# Patient Record
Sex: Male | Born: 1960 | Race: White | Hispanic: No | Smoking: Former smoker
Health system: Southern US, Community
[De-identification: ages and names within clinical notes are randomized; demographics above are authoritative.]

## PROBLEM LIST (undated history)

## (undated) DIAGNOSIS — I701 Atherosclerosis of renal artery: Secondary | ICD-10-CM

## (undated) DIAGNOSIS — J189 Pneumonia, unspecified organism: Secondary | ICD-10-CM

## (undated) DIAGNOSIS — K5792 Diverticulitis of intestine, part unspecified, without perforation or abscess without bleeding: Secondary | ICD-10-CM

## (undated) DIAGNOSIS — I10 Essential (primary) hypertension: Secondary | ICD-10-CM

## (undated) DIAGNOSIS — J449 Chronic obstructive pulmonary disease, unspecified: Secondary | ICD-10-CM

## (undated) HISTORY — DX: Essential (primary) hypertension: I10

## (undated) HISTORY — DX: Diverticulitis of intestine, part unspecified, without perforation or abscess without bleeding: K57.92

## (undated) HISTORY — DX: Atherosclerosis of renal artery: I70.1

## (undated) HISTORY — PX: HERNIA REPAIR: SHX51

---
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
## 1998-01-29 ENCOUNTER — Emergency Department (HOSPITAL_COMMUNITY): Admission: EM | Admit: 1998-01-29 | Discharge: 1998-01-29 | Payer: Self-pay | Admitting: Emergency Medicine

## 1998-02-03 ENCOUNTER — Encounter: Admission: RE | Admit: 1998-02-03 | Discharge: 1998-05-04 | Payer: Self-pay | Admitting: *Deleted

## 1998-02-12 ENCOUNTER — Encounter: Admission: RE | Admit: 1998-02-12 | Discharge: 1998-05-13 | Payer: Self-pay | Admitting: *Deleted

## 2001-05-04 ENCOUNTER — Emergency Department (HOSPITAL_COMMUNITY): Admission: EM | Admit: 2001-05-04 | Discharge: 2001-05-04 | Payer: Self-pay | Admitting: Emergency Medicine

## 2001-05-04 ENCOUNTER — Encounter: Payer: Self-pay | Admitting: Emergency Medicine

## 2010-07-22 ENCOUNTER — Institutional Professional Consult (permissible substitution): Payer: Self-pay | Admitting: Internal Medicine

## 2014-11-09 ENCOUNTER — Ambulatory Visit (INDEPENDENT_AMBULATORY_CARE_PROVIDER_SITE_OTHER): Payer: Self-pay | Admitting: Emergency Medicine

## 2014-11-09 VITALS — BP 148/80 | HR 102 | Temp 98.4°F | Resp 16 | Ht 65.0 in | Wt 166.1 lb

## 2014-11-09 DIAGNOSIS — H6091 Unspecified otitis externa, right ear: Secondary | ICD-10-CM

## 2014-11-09 DIAGNOSIS — IMO0001 Reserved for inherently not codable concepts without codable children: Secondary | ICD-10-CM

## 2014-11-09 DIAGNOSIS — H60331 Swimmer's ear, right ear: Secondary | ICD-10-CM

## 2014-11-09 DIAGNOSIS — H9201 Otalgia, right ear: Secondary | ICD-10-CM

## 2014-11-09 DIAGNOSIS — H748X1 Other specified disorders of right middle ear and mastoid: Secondary | ICD-10-CM

## 2014-11-09 DIAGNOSIS — R030 Elevated blood-pressure reading, without diagnosis of hypertension: Secondary | ICD-10-CM

## 2014-11-09 MED ORDER — AMOXICILLIN 500 MG PO CAPS: 500.0000 mg | ORAL_CAPSULE | Freq: Three times a day (TID) | ORAL | Status: DC

## 2014-11-09 MED ORDER — NEOMYCIN-POLYMYXIN-HC 3.5-10000-1 OT SUSP: 4.0000 [drp] | Freq: Four times a day (QID) | OTIC | Status: DC

## 2014-11-09 NOTE — Patient Instructions (Signed)

## 2014-11-09 NOTE — Progress Notes (Signed)
Subjective:  Patient ID: Matthew [name], male    DOB: March 07, 1961  Age: 54 y.o. MRN: [id number]  CC: Otalgia   HPI Matthew [name] presents   With pain in his right ear. He apparently had some itching in his ear and second Q-tip into his ear. Since that time his had some drainage and some pain. He has no fever chills nausea vomiting no cough or coryza. He has decreased hearing in that ear. Constant pain. He's had no improvement with over-the-counter medication  History Matthew [name] has no past medical history on file.   He has past surgical history that includes Hernia repair.   His  family history includes Lymphoma in his father.  He   reports that he has been smoking Cigarettes.  He has been smoking about 1.00 pack per day. He has never used smokeless tobacco. He reports that he drinks about 1.2 - 2.4 oz of alcohol per week. He reports that he does not use illicit drugs.  No outpatient prescriptions prior to visit.   No facility-administered medications prior to visit.    Social History   Social History  . Marital Status: Widowed    Spouse Name: N/A  . Number of Children: N/A  . Years of Education: N/A   Social History Main Topics  . Smoking status: Current Every Day Smoker -- 1.00 packs/day    Types: Cigarettes  . Smokeless tobacco: Never Used  . Alcohol Use: 1.2 - 2.4 oz/week    2-4 Standard drinks or equivalent per week  . Drug Use: No  . Sexual Activity: Not Asked   Other Topics Concern  . None   Social History Narrative  . None     Review of Systems  Constitutional: Negative for fever, chills and appetite change.  HENT: Positive for ear pain. Negative for congestion, postnasal drip, sinus pressure and sore throat.   Eyes: Negative for pain and redness.  Respiratory: Negative for cough, shortness of breath and wheezing.   Cardiovascular: Negative for leg swelling.  Gastrointestinal: Negative for nausea, vomiting, abdominal pain, diarrhea, constipation and  blood in stool.  Endocrine: Negative for polyuria.  Genitourinary: Negative for dysuria, urgency, frequency and flank pain.  Musculoskeletal: Negative for gait problem.  Skin: Negative for rash.  Neurological: Negative for weakness and headaches.  Psychiatric/Behavioral: Negative for confusion and decreased concentration. The patient is not nervous/anxious.     Objective:  BP 148/80 mmHg  Pulse 102  Temp(Src) 98.4 F (36.9 C) (Oral)  Resp 16  Ht '5\' 5"'$  (1.651 m)  Wt 166 lb 2 oz (75.354 kg)  BMI 27.64 kg/m2  SpO2 97%  Physical Exam  Constitutional: He is oriented to person, place, and time. He appears well-developed and well-nourished. No distress.  HENT:  Head: Normocephalic and atraumatic.  Right Ear: There is tenderness. Tympanic membrane is bulging. There is hemotympanum. Decreased hearing is noted.  Left Ear: External ear normal.  Nose: Nose normal.  Eyes: Conjunctivae and EOM are normal. Pupils are equal, round, and reactive to light. No scleral icterus.  Neck: Normal range of motion. Neck supple. No tracheal deviation present.  Cardiovascular: Normal rate, regular rhythm and normal heart sounds.   Pulmonary/Chest: Effort normal. No respiratory distress. He has no wheezes. He has no rales.  Abdominal: He exhibits no mass. There is no tenderness. There is no rebound and no guarding.  Musculoskeletal: He exhibits no edema.  Lymphadenopathy:    He has no cervical adenopathy.  Neurological: He is alert  and oriented to person, place, and time. Coordination normal.  Skin: Skin is warm and dry. No rash noted.  Psychiatric: He has a normal mood and affect. His behavior is normal.      Assessment & Plan:   Matthew [name] was seen today for otalgia.  Diagnoses and all orders for this visit:  Ear pain, right  Swimmer's ear, right  Hemotympanum, right  Elevated BP  Other orders -     neomycin-polymyxin-hydrocortisone (CORTISPORIN) 3.5-10000-1 otic suspension; Place 4 drops  into the right ear 4 (four) times daily. -     amoxicillin (AMOXIL) 500 MG capsule; Take 1 capsule (500 mg total) by mouth 3 (three) times daily.   I am having Matthew [name] start on neomycin-polymyxin-hydrocortisone and amoxicillin.  Meds ordered this encounter  Medications  . neomycin-polymyxin-hydrocortisone (CORTISPORIN) 3.5-10000-1 otic suspension    Sig: Place 4 drops into the right ear 4 (four) times daily.    Dispense:  10 mL    Refill:  0  . amoxicillin (AMOXIL) 500 MG capsule    Sig: Take 1 capsule (500 mg total) by mouth 3 (three) times daily.    Dispense:  30 capsule    Refill:  0    Appropriate red flag conditions were discussed with the patient as well as actions that should be taken.  Patient expressed his understanding.  Follow-up: Return if symptoms worsen or fail to improve.  Roselee Culver, MD

## 2014-11-11 ENCOUNTER — Telehealth: Payer: Self-pay

## 2014-11-11 NOTE — Telephone Encounter (Signed)
Spoke with pt, advised message from Nicole. Pt understood. 

## 2014-11-11 NOTE — Telephone Encounter (Signed)
He can try taking daily zyrtec which may help. He can take daily for next 2 weeks or so.

## 2014-11-11 NOTE — Telephone Encounter (Signed)
Pt states his ear pain has stopped but still have the fluid behind the ear.  He knows that it may take a while for it to go away but would like to know if there is something over the counter he could take to help  Best number is [phone]

## 2015-08-24 ENCOUNTER — Ambulatory Visit (HOSPITAL_COMMUNITY)
Admission: EM | Admit: 2015-08-24 | Discharge: 2015-08-24 | Disposition: A | Discharge status: Home / Self Care | Payer: 59 | Attending: Family Medicine | Admitting: Family Medicine

## 2015-08-24 ENCOUNTER — Encounter (HOSPITAL_COMMUNITY): Payer: Self-pay | Admitting: Emergency Medicine

## 2015-08-24 DIAGNOSIS — H6091 Unspecified otitis externa, right ear: Secondary | ICD-10-CM | POA: Diagnosis not present

## 2015-08-24 MED ORDER — NEOMYCIN-POLYMYXIN-HC 3.5-10000-1 OT SUSP: 4.0000 [drp] | OTIC | Status: DC

## 2015-08-24 MED ORDER — AMOXICILLIN 500 MG PO CAPS: 500.0000 mg | ORAL_CAPSULE | Freq: Three times a day (TID) | ORAL | Status: DC

## 2015-08-24 NOTE — ED Notes (Signed)
The patient presented to the [facility] with a complaint of right sided ear pain that started 4 days ago but has gotten progressively more painful.

## 2015-08-24 NOTE — ED Provider Notes (Signed)
CSN: [id number]     Arrival date & time 08/24/15  1717 History   First MD Initiated Contact with Patient 08/24/15 1808     Chief Complaint  Patient presents with  . Otalgia   (Consider location/radiation/quality/duration/timing/severity/associated sxs/prior Treatment) HPI History obtained from patient:  Pt presents with the cc of:  Right earache Duration of symptoms: Several days Treatment prior to arrival: None Context: Onset Friday or Saturday of sensation of fluid in the ear. Previous symptoms of this nature approximately a year ago. Other symptoms include: Muffled and echo in the right ear Pain score: 3 FAMILY HISTORY: Father-cancer    History reviewed. No pertinent past medical history. Past Surgical History  Procedure Laterality Date  . Hernia repair     Family History  Problem Relation Age of Onset  . Lymphoma Father    Social History  Substance Use Topics  . Smoking status: Current Every Day Smoker -- 1.00 packs/day    Types: Cigarettes  . Smokeless tobacco: Never Used  . Alcohol Use: 1.2 - 2.4 oz/week    2-4 Standard drinks or equivalent per week    Review of Systems  Denies: HEADACHE, NAUSEA, ABDOMINAL PAIN, CHEST PAIN, CONGESTION, DYSURIA, SHORTNESS OF BREATH  Allergies  Review of patient's allergies indicates no known allergies.  Home Medications   Prior to Admission medications   Medication Sig Start Date End Date Taking? Authorizing Provider  amoxicillin (AMOXIL) 500 MG capsule Take 1 capsule (500 mg total) by mouth 3 (three) times daily. 11/09/14   Roselee Culver, MD  amoxicillin (AMOXIL) 500 MG capsule Take 1 capsule (500 mg total) by mouth 3 (three) times daily. 08/24/15   Konrad Felix, PA  neomycin-polymyxin-hydrocortisone (CORTISPORIN) 3.5-10000-1 otic suspension Place 4 drops into the right ear 4 (four) times daily. 11/09/14   Roselee Culver, MD  neomycin-polymyxin-hydrocortisone (CORTISPORIN) 3.5-10000-1 otic suspension Place 4 drops  into the right ear every 4 (four) hours. 08/24/15   Konrad Felix, PA   Meds Ordered and Administered this Visit  Medications - No data to display  BP 159/81 mmHg  Pulse 97  Temp(Src) 98.1 F (36.7 C) (Oral)  Resp 16  SpO2 97% No data found.   Physical Exam NURSES NOTES AND VITAL SIGNS REVIEWED. CONSTITUTIONAL: Well developed, well nourished, no acute distress HEENT: normocephalic, atraumaticRight EAC is swollen and red with exudate TM is clear left TM is clear. EYES: Conjunctiva normal NECK:normal ROM, supple, no adenopathy PULMONARY:No respiratory distress, normal effort ABDOMINAL: Soft, ND, NT BS+, No CVAT MUSCULOSKELETAL: Normal ROM of all extremities,  SKIN: warm and dry without rash PSYCHIATRIC: Mood and affect, behavior are normal   ED Course  Procedures (including critical care time)  Labs Review Labs Reviewed - No data to display  Imaging Review No results found.   Visual Acuity Review  Right Eye Distance:   Left Eye Distance:   Bilateral Distance:    Right Eye Near:   Left Eye Near:    Bilateral Near:       RX amoxil, cortisporin  MDM   1. Otitis externa, right     Patient is reassured that there are no issues that require transfer to higher level of care at this time or additional tests. Patient is advised to continue home symptomatic treatment. Patient is advised that if there are new or worsening symptoms to attend the emergency department, contact primary care provider, or return to UC. Instructions of care provided discharged home in stable condition.  THIS NOTE WAS GENERATED USING A VOICE RECOGNITION SOFTWARE PROGRAM. ALL REASONABLE EFFORTS  WERE MADE TO PROOFREAD THIS DOCUMENT FOR ACCURACY.  I have verbally reviewed the discharge instructions with the patient. A printed AVS was given to the patient.  All questions were answered prior to discharge.      Konrad Felix, [place] 08/24/15 1840

## 2015-08-24 NOTE — Discharge Instructions (Signed)
Otitis Externa Otitis externa is a germ infection in the outer ear. The outer ear is the area from the eardrum to the outside of the ear. Otitis externa is sometimes called "swimmer's ear." HOME CARE  Put drops in the ear as told by your doctor.  Only take medicine as told by your doctor.  If you have diabetes, your doctor may give you more directions. Follow your doctor's directions.  Keep all doctor visits as told. To avoid another infection:  Keep your ear dry. Use the corner of a towel to dry your ear after swimming or bathing.  Avoid scratching or putting things inside your ear.  Avoid swimming in lakes, dirty water, or pools that use a chemical called chlorine poorly.  You may use ear drops after swimming. Combine equal amounts of white vinegar and alcohol in a bottle. Put 3 or 4 drops in each ear. GET HELP IF:   You have a fever.  Your ear is still red, puffy (swollen), or painful after 3 days.  You still have yellowish-white fluid (pus) coming from the ear after 3 days.  Your redness, puffiness, or pain gets worse.  You have a really bad headache.  You have redness, puffiness, pain, or tenderness behind your ear. MAKE SURE YOU:   Understand these instructions.  Will watch your condition.  Will get help right away if you are not doing well or get worse.   This information is not intended to replace advice given to you by your health care provider. Make sure you discuss any questions you have with your health care provider.   Document Released: 08/31/2007 Document Revised: 04/04/2014 Document Reviewed: 03/31/2011 Elsevier Interactive Patient Education 2016 [place] Drops, Adult You need to put eardrops in your ear. HOME CARE   Put drops in your affected ear as told.  After putting in the drops, lie down with the ear you put the drops in facing up. Stay this way for 10 minutes. Use the ear drops as long as your doctor tells you.  Before you get  up, put a cotton ball gently in your ear. Do not push it far in your ear.  Do not wash out your ears unless your doctor says it is okay.  Finish all medicines as told by your doctor. You may be told to keep using the eardrops even if you start to feel better.  See your doctor as told for follow-up visits. GET HELP IF:  You have pain that gets worse.  Any unusual fluid (drainage) is coming from your ear (especially if the fluid stinks).  You have trouble hearing.  You get really dizzy as if the room is spinning and feel sick to your stomach (vertigo).  The outside of your ear becomes red or puffy or both. This may be a sign of an allergic reaction. MAKE SURE YOU:   Understand these instructions.  Will watch your condition.  Will get help right away if you are not doing well or get worse.   This information is not intended to replace advice given to you by your health care provider. Make sure you discuss any questions you have with your health care provider.   Document Released: 09/01/2009 Document Revised: 04/04/2014 Document Reviewed: 10/09/2012 Elsevier Interactive Patient Education [employer].

## 2016-01-27 ENCOUNTER — Encounter (INDEPENDENT_AMBULATORY_CARE_PROVIDER_SITE_OTHER): Payer: Self-pay | Admitting: Ophthalmology

## 2016-02-10 ENCOUNTER — Encounter (INDEPENDENT_AMBULATORY_CARE_PROVIDER_SITE_OTHER): Payer: 59 | Admitting: Ophthalmology

## 2016-02-10 DIAGNOSIS — H353122 Nonexudative age-related macular degeneration, left eye, intermediate dry stage: Secondary | ICD-10-CM | POA: Diagnosis not present

## 2016-02-10 DIAGNOSIS — H35033 Hypertensive retinopathy, bilateral: Secondary | ICD-10-CM | POA: Diagnosis not present

## 2016-02-10 DIAGNOSIS — H2513 Age-related nuclear cataract, bilateral: Secondary | ICD-10-CM | POA: Diagnosis not present

## 2016-02-10 DIAGNOSIS — H43813 Vitreous degeneration, bilateral: Secondary | ICD-10-CM | POA: Diagnosis not present

## 2016-02-10 DIAGNOSIS — I10 Essential (primary) hypertension: Secondary | ICD-10-CM | POA: Diagnosis not present

## 2016-03-02 ENCOUNTER — Encounter (INDEPENDENT_AMBULATORY_CARE_PROVIDER_SITE_OTHER): Payer: 59 | Admitting: Ophthalmology

## 2016-03-02 DIAGNOSIS — I10 Essential (primary) hypertension: Secondary | ICD-10-CM | POA: Diagnosis not present

## 2016-03-02 DIAGNOSIS — H43813 Vitreous degeneration, bilateral: Secondary | ICD-10-CM

## 2016-03-02 DIAGNOSIS — H353221 Exudative age-related macular degeneration, left eye, with active choroidal neovascularization: Secondary | ICD-10-CM

## 2016-03-02 DIAGNOSIS — H353111 Nonexudative age-related macular degeneration, right eye, early dry stage: Secondary | ICD-10-CM | POA: Diagnosis not present

## 2016-03-02 DIAGNOSIS — H35033 Hypertensive retinopathy, bilateral: Secondary | ICD-10-CM | POA: Diagnosis not present

## 2016-03-30 ENCOUNTER — Encounter (INDEPENDENT_AMBULATORY_CARE_PROVIDER_SITE_OTHER): Payer: 59 | Admitting: Ophthalmology

## 2016-03-30 DIAGNOSIS — I10 Essential (primary) hypertension: Secondary | ICD-10-CM | POA: Diagnosis not present

## 2016-03-30 DIAGNOSIS — H43813 Vitreous degeneration, bilateral: Secondary | ICD-10-CM | POA: Diagnosis not present

## 2016-03-30 DIAGNOSIS — H35712 Central serous chorioretinopathy, left eye: Secondary | ICD-10-CM | POA: Diagnosis not present

## 2016-03-30 DIAGNOSIS — H35033 Hypertensive retinopathy, bilateral: Secondary | ICD-10-CM

## 2016-04-11 ENCOUNTER — Encounter: Payer: Self-pay | Admitting: Adult Health

## 2016-04-11 ENCOUNTER — Ambulatory Visit (INDEPENDENT_AMBULATORY_CARE_PROVIDER_SITE_OTHER): Payer: 59 | Admitting: Adult Health

## 2016-04-11 VITALS — BP 158/87 | HR 96 | Ht 65.25 in | Wt 153.2 lb

## 2016-04-11 DIAGNOSIS — I10 Essential (primary) hypertension: Secondary | ICD-10-CM

## 2016-04-11 DIAGNOSIS — R5383 Other fatigue: Secondary | ICD-10-CM | POA: Diagnosis not present

## 2016-04-11 DIAGNOSIS — H353290 Exudative age-related macular degeneration, unspecified eye, stage unspecified: Secondary | ICD-10-CM

## 2016-04-11 DIAGNOSIS — M791 Myalgia, unspecified site: Secondary | ICD-10-CM

## 2016-04-11 NOTE — Progress Notes (Signed)
   Subjective:    Patient ID: Matthew [name], male    DOB: [birth year], 56 y.o.   MRN: [id number]  HPI:  Mr. Poffenberger presents to establish as a new pt.  He has not been working with a PCP, however using Urgent Care for all health care needs.  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 He is smoking "about a pack a day", since high school.      Review of Systems  Constitutional: Negative for activity change, appetite change, chills, diaphoresis, fatigue, fever and unexpected weight change.  HENT: Positive for congestion, postnasal drip and sinus pressure. Negative for trouble swallowing and voice change.   Eyes: Positive for discharge, redness and itching. Negative for visual disturbance.  Respiratory: Negative for chest tightness, shortness of breath and wheezing.   Cardiovascular: Negative for chest pain, palpitations and leg swelling.  Gastrointestinal: Negative for constipation, diarrhea and nausea.  Endocrine: Negative for cold intolerance, heat intolerance, polydipsia and polyphagia.  Genitourinary: Negative for difficulty urinating.  Musculoskeletal: Positive for arthralgias and myalgias. Negative for gait problem.  Skin: Negative for color change.  Neurological: Negative for dizziness, speech difficulty and numbness.  Psychiatric/Behavioral: Negative for agitation and behavioral problems.       Objective:   Physical Exam  Constitutional: He is oriented to person, place, and time. He appears well-developed and well-nourished. No distress.  HENT:  Head: Normocephalic and atraumatic.  Eyes: Conjunctivae are normal. Pupils are equal, round, and reactive to light.  Neck: Normal range of motion. Neck supple. No tracheal deviation present. No thyromegaly present.  Cardiovascular: Normal rate, regular rhythm, normal heart sounds and intact distal pulses.   Pulmonary/Chest: Effort normal and breath sounds normal. No respiratory distress. He has no wheezes. He  has no rales. He exhibits no tenderness.  Abdominal: Soft. Bowel sounds are normal. There is no tenderness.  Musculoskeletal: Normal range of motion. He exhibits tenderness.       Right shoulder: He exhibits tenderness.       Left shoulder: He exhibits tenderness.       Right elbow: Tenderness found.       Left elbow: Tenderness found.       Thoracic back: He exhibits tenderness.       Lumbar back: He exhibits tenderness.  Neurological: He is alert and oriented to person, place, and time. He has normal reflexes.  Skin: Skin is warm and dry. He is not diaphoretic.  Psychiatric: He has a normal mood and affect. His behavior is normal. Judgment and thought content normal.  Nursing note and vitals reviewed.         Assessment & Plan:  1. HTN-Eplerone as directed.  Reduce sodium and processed foods.  Reduce tobacco use.  2.  Fatigue-increase nightly sleep, increase water intake. 3. Myalgia-recommend daily stretching, warm Epson salt baths. 4.  AMD-continue with Optometrist as directed. Continue Rx eye gtt's. Return for fasting labs ASAP. Return in 4 weeks for BP re-check.   All questions/concerns answered.

## 2016-04-11 NOTE — Patient Instructions (Addendum)
Hypertension Hypertension is another name for high blood pressure. High blood pressure forces your heart to work harder to pump blood. A blood pressure reading has two numbers, which includes a higher number over a lower number (example: 110/72). Follow these instructions at home:  Have your blood pressure rechecked by your doctor.  Only take medicine as told by your doctor. Follow the directions carefully. The medicine does not work as well if you skip doses. Skipping doses also puts you at risk for problems.  Do not smoke.  Monitor your blood pressure at home as told by your doctor. Contact a doctor if:  You think you are having a reaction to the medicine you are taking.  You have repeat headaches or feel dizzy.  You have puffiness (swelling) in your ankles.  You have trouble with your vision. Get help right away if:  You get a very bad headache and are confused.  You feel weak, numb, or faint.  You get chest or belly (abdominal) pain.  You throw up (vomit).  You cannot breathe very well. This information is not intended to replace advice given to you by your health care provider. Make sure you discuss any questions you have with your health care provider. Document Released: 08/31/2007 Document Revised: 08/20/2015 Document Reviewed: 01/04/2013 Elsevier Interactive Patient Education  2017 [place] Degeneration Introduction Age-related macular degeneration (AMD) is an eye disease related to aging. The disease causes you to lose your central vision, which is the part of your vision that allows you to see objects clearly and do daily tasks like reading and driving. There are two main types of AMD:  Dry AMD. People with this type lose their vision slowly. This is the most common type of AMD. Some people with dry AMD notice very little change in their vision as they age.  Wet AMD. People with this type lose their vision quickly. What are the  causes? This condition is caused by damage to the part of the eye that provides you with central vision (macula). Dry AMD happens when deposits in the macula cause light-sensitive cells to slowly break down. Wet AMD happens when abnormal blood vessels grow under the macula and leak blood and fluid. What increases the risk? The following factors may make you more likely to develop this condition:  Being 56 years old or older.  Smoking.  Being obese.  Having a family history of AMD.  Having high cholesterol, high blood pressure, or heart disease.  Having been exposed to high levels of ultraviolet (UV) light and blue light.  Being white (Caucasian).  Being male. What are the signs or symptoms? Symptoms of this condition include:  Blurred vision, especially when reading print material. The blurred vision often goes away in brighter light.  A blurred or blind spot in the center of your field of vision that is small but growing larger.  Bright colors seeming less bright than they used to be.  Decreased ability to recognize faces.  One eye seeming worse than the other.  Decreased ability to adapt to dimly lit rooms.  Straight lines appearing crooked or wavy. How is this diagnosed? This condition is diagnosed based on your symptoms and an eye exam. During the eye exam, eye drops will be placed in your eyes to enlarge (dilate) your pupils. This will allow your health care provider to see the back of your eye better. You may be asked to look at an image that looks like  a checkerboard ([place]ler grid). Early changes in your central vision may cause the grid to look distorted. After the exam, you may be given a test called a fluorescein angiogram. This test determines whether you have dry or wet AMD. During this test, dye is injected into a vein and pictures are taken as the dye travels through your eye. How is this treated? There is no cure for this condition, but treatment can help slow  down how fast it gets worse. Treatment may include:  Supplements, including vitamin C, vitamin E, beta carotene, and zinc.  Laser surgery to destroy new blood vessels or leaking blood vessels in your eye.  Injections of medicines into your eye to slow down the formation of abnormal blood vessels that might leak. These injections are given by a health care provider. Follow these instructions at home:  Get an eye exam as often told by your health care provider. Make sure to get an eye exam at least once every year.  Take vitamins and supplements as told by your health care provider.  Take over-the-counter and prescription medicines only as told by your health care provider.  Ask your health care provider for an Amsler grid, and use it every day to check each eye for vision changes.  Keep all follow-up visits as told by your health care provider. This is important. Contact a health care provider if:  You notice any new changes in your vision. This information is not intended to replace advice given to you by your health care provider. Make sure you discuss any questions you have with your health care provider. Document Released: 06/21/2007 Document Revised: 08/20/2015 Document Reviewed: 01/08/2015  2017 Elsevier   Start Eplereone '50mg'$  as directed. Return for fasting lab work ASAP. Reduce sodium and processed foods. Reduce tobacco use. Return for re-evaluation in 4 weeks for BP recheck.

## 2016-04-27 ENCOUNTER — Encounter (INDEPENDENT_AMBULATORY_CARE_PROVIDER_SITE_OTHER): Payer: 59 | Admitting: Ophthalmology

## 2016-04-27 DIAGNOSIS — I10 Essential (primary) hypertension: Secondary | ICD-10-CM

## 2016-04-27 DIAGNOSIS — H353122 Nonexudative age-related macular degeneration, left eye, intermediate dry stage: Secondary | ICD-10-CM | POA: Diagnosis not present

## 2016-04-27 DIAGNOSIS — H35033 Hypertensive retinopathy, bilateral: Secondary | ICD-10-CM

## 2016-04-27 DIAGNOSIS — H35712 Central serous chorioretinopathy, left eye: Secondary | ICD-10-CM | POA: Diagnosis not present

## 2016-04-27 DIAGNOSIS — H43813 Vitreous degeneration, bilateral: Secondary | ICD-10-CM

## 2016-04-27 DIAGNOSIS — H2513 Age-related nuclear cataract, bilateral: Secondary | ICD-10-CM | POA: Diagnosis not present

## 2016-04-29 ENCOUNTER — Other Ambulatory Visit (INDEPENDENT_AMBULATORY_CARE_PROVIDER_SITE_OTHER): Payer: 59

## 2016-04-29 DIAGNOSIS — I10 Essential (primary) hypertension: Secondary | ICD-10-CM

## 2016-04-29 DIAGNOSIS — R5383 Other fatigue: Secondary | ICD-10-CM

## 2016-04-29 DIAGNOSIS — H353290 Exudative age-related macular degeneration, unspecified eye, stage unspecified: Secondary | ICD-10-CM

## 2016-04-29 DIAGNOSIS — M791 Myalgia, unspecified site: Secondary | ICD-10-CM

## 2016-04-30 LAB — COMPREHENSIVE METABOLIC PANEL
ALT: 16 IU/L (ref 0–44)
AST: 18 IU/L (ref 0–40)
Albumin/Globulin Ratio: 2.1 (ref 1.2–2.2)
Albumin: 4.4 g/dL (ref 3.5–5.5)
Alkaline Phosphatase: 72 IU/L (ref 39–117)
BUN/Creatinine Ratio: 16 (ref 9–20)
BUN: 15 mg/dL (ref 6–24)
Bilirubin Total: <0.2 mg/dL (ref 0.0–1.2)
CALCIUM: 8.9 mg/dL (ref 8.7–10.2)
CO2: 24 mmol/L (ref 18–29)
CREATININE: 0.91 mg/dL (ref 0.76–1.27)
Chloride: 102 mmol/L (ref 96–106)
GFR, EST AFRICAN AMERICAN: 109 mL/min/{1.73_m2} (ref >59)
GFR, EST NON AFRICAN AMERICAN: 95 mL/min/{1.73_m2} (ref >59)
GLOBULIN, TOTAL: 2.1 g/dL (ref 1.5–4.5)
Glucose: 113 mg/dL — ABNORMAL HIGH (ref 65–99)
Potassium: 4.7 mmol/L (ref 3.5–5.2)
Sodium: 141 mmol/L (ref 134–144)
TOTAL PROTEIN: 6.5 g/dL (ref 6.0–8.5)

## 2016-04-30 LAB — CBC WITH DIFFERENTIAL/PLATELET
BASOS: 1 %
Basophils Absolute: 0.1 10*3/uL (ref 0.0–0.2)
EOS (ABSOLUTE): 0.2 10*3/uL (ref 0.0–0.4)
EOS: 3 %
HEMATOCRIT: 45 % (ref 37.5–51.0)
Hemoglobin: 15.5 g/dL (ref 13.0–17.7)
IMMATURE GRANS (ABS): 0 10*3/uL (ref 0.0–0.1)
IMMATURE GRANULOCYTES: 0 %
LYMPHS: 23 %
Lymphocytes Absolute: 1.7 10*3/uL (ref 0.7–3.1)
MCH: 31.4 pg (ref 26.6–33.0)
MCHC: 34.4 g/dL (ref 31.5–35.7)
MCV: 91 fL (ref 79–97)
MONOS ABS: 0.9 10*3/uL (ref 0.1–0.9)
Monocytes: 12 %
NEUTROS PCT: 61 %
Neutrophils Absolute: 4.5 10*3/uL (ref 1.4–7.0)
PLATELETS: 283 10*3/uL (ref 150–379)
RBC: 4.93 x10E6/uL (ref 4.14–5.80)
RDW: 13 % (ref 12.3–15.4)
WBC: 7.4 10*3/uL (ref 3.4–10.8)

## 2016-04-30 LAB — LIPID PANEL
CHOL/HDL RATIO: 3.1 ratio (ref 0.0–5.0)
Cholesterol, Total: 182 mg/dL (ref 100–199)
HDL: 58 mg/dL (ref >39)
LDL CALC: 94 mg/dL (ref 0–99)
TRIGLYCERIDES: 151 mg/dL — AB (ref 0–149)
VLDL Cholesterol Cal: 30 mg/dL (ref 5–40)

## 2016-04-30 LAB — TSH: TSH: 1.41 u[IU]/mL (ref 0.450–4.500)

## 2016-04-30 LAB — VITAMIN D 25 HYDROXY (VIT D DEFICIENCY, FRACTURES): Vit D, 25-Hydroxy: 14.9 ng/mL — ABNORMAL LOW (ref 30.0–100.0)

## 2016-05-03 ENCOUNTER — Other Ambulatory Visit: Payer: Self-pay | Admitting: Adult Health

## 2016-05-03 DIAGNOSIS — E559 Vitamin D deficiency, unspecified: Secondary | ICD-10-CM

## 2016-05-03 MED ORDER — VITAMIN D (ERGOCALCIFEROL) 1.25 MG (50000 UNIT) PO CAPS: 50000.0000 [IU] | ORAL_CAPSULE | ORAL | 0 refills | Status: DC

## 2016-05-03 NOTE — Progress Notes (Signed)
Start weekly Vit d replacement for 6 months.

## 2016-05-05 ENCOUNTER — Ambulatory Visit (INDEPENDENT_AMBULATORY_CARE_PROVIDER_SITE_OTHER): Payer: 59 | Admitting: Adult Health

## 2016-05-05 ENCOUNTER — Encounter: Payer: Self-pay | Admitting: Adult Health

## 2016-05-05 DIAGNOSIS — J029 Acute pharyngitis, unspecified: Secondary | ICD-10-CM | POA: Insufficient documentation

## 2016-05-05 DIAGNOSIS — H6691 Otitis media, unspecified, right ear: Secondary | ICD-10-CM | POA: Diagnosis not present

## 2016-05-05 HISTORY — DX: Otitis media, unspecified, right ear: H66.91

## 2016-05-05 HISTORY — DX: Acute pharyngitis, unspecified: J02.9

## 2016-05-05 MED ORDER — AMOXICILLIN-POT CLAVULANATE 875-125 MG PO TABS: 1.0000 | ORAL_TABLET | Freq: Two times a day (BID) | ORAL | 0 refills | Status: DC

## 2016-05-05 NOTE — Assessment & Plan Note (Signed)
Augmentin Rx'd sent in-take as directed. Increase fluids/rest/vit c. Stop tobacco use. If sx's persist after ABX completed, then please RTC.

## 2016-05-05 NOTE — Patient Instructions (Signed)
Otitis Media, Adult Otitis media is redness, soreness, and puffiness (swelling) in the space just behind your eardrum (middle ear). It may be caused by allergies or infection. It often happens along with a cold. Follow these instructions at home:  Take your medicine as told. Finish it even if you start to feel better.  Only take over-the-counter or prescription medicines for pain, discomfort, or fever as told by your doctor.  Follow up with your doctor as told. Contact a doctor if:  You have otitis media only in one ear, or bleeding from your nose, or both.  You notice a lump on your neck.  You are not getting better in 3-5 days.  You feel worse instead of better. Get help right away if:  You have pain that is not helped with medicine.  You have puffiness, redness, or pain around your ear.  You get a stiff neck.  You cannot move part of your face (paralysis).  You notice that the bone behind your ear hurts when you touch it. This information is not intended to replace advice given to you by your health care provider. Make sure you discuss any questions you have with your health care provider. Document Released: 08/31/2007 Document Revised: 08/20/2015 Document Reviewed: 10/09/2012 Elsevier Interactive Patient Education  2017 Else[employer] Inc[employer]   Pharyngitis Pharyngitis is a sore throat (pharynx). There is redness, pain, and swelling of your throat. Follow these instructions at home:  Drink enough fluids to keep your pee (urine) clear or pale yellow.  Only take medicine as told by your doctor.  You may get sick again if you do not take medicine as told. Finish your medicines, even if you start to feel better.  Do not take aspirin.  Rest.  Rinse your mouth (gargle) with salt water ( tsp of salt per 1 qt of water) every 1-2 hours. This will help the pain.  If you are not at risk for choking, you can suck on hard candy or sore throat lozenges. Contact a doctor if:  You  have large, tender lumps on your neck.  You have a rash.  You cough up green, yellow-brown, or bloody spit. Get help right away if:  You have a stiff neck.  You drool or cannot swallow liquids.  You throw up (vomit) or are not able to keep medicine or liquids down.  You have very bad pain that does not go away with medicine.  You have problems breathing (not from a stuffy nose). This information is not intended to replace advice given to you by your health care provider. Make sure you discuss any questions you have with your health care provider. Document Released: 08/31/2007 Document Revised: 08/20/2015 Document Reviewed: 11/19/2012 Elsevier Interactive Patient Education  2017 [place].   Please take Augmentin as directed.  Increase fluids/rest/Vit C.  Alternate OTC Ibuprofen and Acetaminophen as directed by manufacturer. Warm salt water gargles few times a day. If symptoms continue after antibiotic complete, please call clinic.

## 2016-05-05 NOTE — Progress Notes (Signed)
Subjective:    Patient ID: Matthew [name], male    DOB: [birth year], 56 y.o.   MRN: [id number]  HPI :  Mr. Scheiber presents for evaluation of right ear pain (8/10), right side facial tenderness (4/10), and sore throat (8/10 that worsens when swallowing).  Sx's began Monday and have steadily worsened since then.  He has been using OTC Ibuprofen intermittently for sx relief, last dose was yesterday night.  He denies N/V/D/CP/dyspnea.  He denies being around anyone with acute strep infection.   Patient Care Team    Relationship Specialty Notifications Start End  Odella Aquas, NP PCP - General Family Medicine  04/11/16     Patient Active Problem List   Diagnosis Date Noted  . Acute otitis media, right 05/05/2016  . Pharyngitis 05/05/2016     Past Medical History:  Diagnosis Date  . Hypertension      Past Surgical History:  Procedure Laterality Date  . HERNIA REPAIR    . HERNIA REPAIR  1966     Family History  Problem Relation Age of Onset  . Lymphoma Father   . Cancer Mother     breast     History  Drug Use  . Frequency: 2.0 times per week  . Types: Marijuana     History  Alcohol Use  . 1.2 - 2.4 oz/week  . 2 - 4 Standard drinks or equivalent per week    Comment: 7 cans of beer a week.     History  Smoking Status  . Current Every Day Smoker  . Packs/day: 1.00  . Types: Cigarettes  Smokeless Tobacco  . Never Used     Outpatient Encounter Prescriptions as of 05/05/2016  Medication Sig Note  . BESIVANCE 0.6 % SUSP Place 4 drops into the left eye daily. 4 drops in Left Eye as directed by ophthalmologist. 04/11/2016: Received from: External Pharmacy  . eplerenone (INSPRA) 50 MG tablet Take 1 tablet by mouth 2 (two) times daily. 04/11/2016: Received from: External Pharmacy  . Vitamin D, Ergocalciferol, (DRISDOL) 50000 units CAPS capsule Take 1 capsule (50,000 Units total) by mouth every 7 (seven) days.   Marland Kitchen amoxicillin-clavulanate (AUGMENTIN) 875-125 MG tablet  Take 1 tablet by mouth 2 (two) times daily.    No facility-administered encounter medications on file as of 05/05/2016.     Allergies: Patient has no known allergies.  Body mass index is 28.52 kg/m.  Blood pressure (!) 164/77, pulse 96, temperature 98.5 F (36.9 C), temperature source Oral, height 5' 2.25" (1.581 m), weight 157 lb 3.2 oz (71.3 kg).    Review of Systems  Constitutional: Positive for activity change, appetite change, fatigue and fever. Negative for chills, diaphoresis and unexpected weight change.  HENT: Positive for postnasal drip, sinus pressure and sore throat. Negative for congestion and trouble swallowing.        Can swallow without difficulty, however "it really hurts".  Eyes: Negative for visual disturbance.  Respiratory: Positive for cough. Negative for shortness of breath.        Continues to smoke, 1/2- 1 pack daily.  Cardiovascular: Negative for chest pain, palpitations and leg swelling.  Gastrointestinal: Negative for abdominal distention, constipation, diarrhea, nausea and vomiting.  Endocrine: Negative for cold intolerance, heat intolerance, polydipsia, polyphagia and polyuria.  Genitourinary: Negative for difficulty urinating.  Skin: Negative for color change, pallor, rash and wound.  Neurological: Negative for dizziness, tremors and weakness.       Objective:   Physical Exam  Constitutional:  He is oriented to person, place, and time. He appears well-developed and well-nourished. He appears distressed.  HENT:  Head: Normocephalic and atraumatic.  Right Ear: There is swelling and tenderness. Tympanic membrane is erythematous and bulging. A middle ear effusion is present.  Left Ear: External ear normal. No swelling or tenderness. Tympanic membrane is erythematous. Tympanic membrane is not bulging.  Nose: Mucosal edema and rhinorrhea present. No sinus tenderness. Right sinus exhibits maxillary sinus tenderness and frontal sinus tenderness. Left sinus  exhibits no maxillary sinus tenderness and no frontal sinus tenderness.  Mouth/Throat: Uvula is midline. Mucous membranes are not pale, not dry and not cyanotic. Oropharyngeal exudate, posterior oropharyngeal edema and posterior oropharyngeal erythema present. No tonsillar abscesses.  Eyes: Conjunctivae and EOM are normal. Pupils are equal, round, and reactive to light.  Neck: Normal range of motion. Neck supple. No tracheal deviation present.  Right anterior, cervical lymphadenopathy.  Cardiovascular: Normal rate, regular rhythm, normal heart sounds and intact distal pulses.   Pulmonary/Chest: Effort normal and breath sounds normal. No respiratory distress. He has no wheezes.  Lymphadenopathy:    He has cervical adenopathy.  Neurological: He is alert and oriented to person, place, and time. He has normal reflexes.  Skin: Skin is warm and dry. No rash noted. He is not diaphoretic. No erythema. No pallor.  Psychiatric: He has a normal mood and affect. His behavior is normal. Judgment and thought content normal.  Nursing note and vitals reviewed.         Assessment & Plan:   1. Acute otitis media, right   2. Pharyngitis, unspecified etiology     Pharyngitis Alternate OTC Acetaminophen and Ibuprofen per manufacturer directions. Warm salt water gargles several times daily. Increase fluids/vit c.  Acute otitis media, right Augmentin Rx'd sent in-take as directed. Increase fluids/rest/vit c. Stop tobacco use. If sx's persist after ABX completed, then please RTC.    FOLLOW-UP:  Return if symptoms worsen or fail to improve.

## 2016-05-05 NOTE — Assessment & Plan Note (Signed)
Alternate OTC Acetaminophen and Ibuprofen per manufacturer directions. Warm salt water gargles several times daily. Increase fluids/vit c.

## 2016-10-31 ENCOUNTER — Ambulatory Visit (INDEPENDENT_AMBULATORY_CARE_PROVIDER_SITE_OTHER): Payer: 59 | Admitting: Ophthalmology

## 2017-03-30 ENCOUNTER — Observation Stay (HOSPITAL_COMMUNITY)
Admission: EM | Admit: 2017-03-30 | Discharge: 2017-04-01 | DRG: 193 | Disposition: A | Discharge status: Home / Self Care | Payer: 59 | Attending: Internal Medicine | Admitting: Internal Medicine

## 2017-03-30 ENCOUNTER — Other Ambulatory Visit: Payer: Self-pay

## 2017-03-30 ENCOUNTER — Emergency Department (HOSPITAL_COMMUNITY): Payer: 59

## 2017-03-30 ENCOUNTER — Encounter (HOSPITAL_COMMUNITY): Payer: Self-pay | Admitting: *Deleted

## 2017-03-30 DIAGNOSIS — J209 Acute bronchitis, unspecified: Secondary | ICD-10-CM

## 2017-03-30 DIAGNOSIS — I10 Essential (primary) hypertension: Secondary | ICD-10-CM | POA: Diagnosis present

## 2017-03-30 DIAGNOSIS — E871 Hypo-osmolality and hyponatremia: Secondary | ICD-10-CM | POA: Diagnosis not present

## 2017-03-30 DIAGNOSIS — J189 Pneumonia, unspecified organism: Secondary | ICD-10-CM

## 2017-03-30 DIAGNOSIS — J9601 Acute respiratory failure with hypoxia: Secondary | ICD-10-CM | POA: Diagnosis not present

## 2017-03-30 DIAGNOSIS — F17210 Nicotine dependence, cigarettes, uncomplicated: Secondary | ICD-10-CM | POA: Diagnosis not present

## 2017-03-30 DIAGNOSIS — J180 Bronchopneumonia, unspecified organism: Principal | ICD-10-CM | POA: Diagnosis present

## 2017-03-30 DIAGNOSIS — R0902 Hypoxemia: Secondary | ICD-10-CM

## 2017-03-30 DIAGNOSIS — H35719 Central serous chorioretinopathy, unspecified eye: Secondary | ICD-10-CM | POA: Diagnosis present

## 2017-03-30 HISTORY — DX: Acute bronchitis, unspecified: J20.9

## 2017-03-30 HISTORY — DX: Chronic obstructive pulmonary disease, unspecified: J44.9

## 2017-03-30 HISTORY — DX: Pneumonia, unspecified organism: J18.9

## 2017-03-30 HISTORY — DX: Acute respiratory failure with hypoxia: J96.01

## 2017-03-30 LAB — CBC WITH DIFFERENTIAL/PLATELET
BASOS ABS: 0 10*3/uL (ref 0.0–0.1)
Basophils Relative: 0 %
EOS ABS: 0 10*3/uL (ref 0.0–0.7)
EOS PCT: 0 %
HCT: 45.8 % (ref 39.0–52.0)
Hemoglobin: 15.7 g/dL (ref 13.0–17.0)
LYMPHS PCT: 11 %
Lymphs Abs: 1.1 10*3/uL (ref 0.7–4.0)
MCH: 32.1 pg (ref 26.0–34.0)
MCHC: 34.3 g/dL (ref 30.0–36.0)
MCV: 93.7 fL (ref 78.0–100.0)
MONO ABS: 1 10*3/uL (ref 0.1–1.0)
Monocytes Relative: 9 %
Neutro Abs: 8.3 10*3/uL (ref 1.7–7.7)
Neutrophils Relative %: 80 %
PLATELETS: 223 10*3/uL (ref 150–400)
RBC: 4.89 MIL/uL (ref 4.22–5.81)
RDW: 12.8 % (ref 11.5–15.5)
WBC: 10.4 10*3/uL (ref 4.0–10.5)

## 2017-03-30 LAB — BRAIN NATRIURETIC PEPTIDE: B NATRIURETIC PEPTIDE 5: 22.9 pg/mL (ref 0.0–100.0)

## 2017-03-30 LAB — INFLUENZA PANEL BY PCR (TYPE A & B)
Influenza A By PCR: NEGATIVE
Influenza B By PCR: NEGATIVE

## 2017-03-30 LAB — COMPREHENSIVE METABOLIC PANEL
ALBUMIN: 3.9 g/dL (ref 3.5–5.0)
ALT: 21 U/L (ref 17–63)
AST: 27 U/L (ref 15–41)
Alkaline Phosphatase: 74 U/L (ref 38–126)
Anion gap: 12 (ref 5–15)
BILIRUBIN TOTAL: 0.9 mg/dL (ref 0.3–1.2)
BUN: 13 mg/dL (ref 6–20)
CO2: 21 mmol/L — ABNORMAL LOW (ref 22–32)
CREATININE: 0.88 mg/dL (ref 0.61–1.24)
Calcium: 8.7 mg/dL — ABNORMAL LOW (ref 8.9–10.3)
Chloride: 96 mmol/L — ABNORMAL LOW (ref 101–111)
GFR calc Af Amer: >60 mL/min (ref >60)
GLUCOSE: 131 mg/dL — AB (ref 65–99)
POTASSIUM: 4.1 mmol/L (ref 3.5–5.1)
Sodium: 129 mmol/L — ABNORMAL LOW (ref 135–145)
Total Protein: 7.1 g/dL (ref 6.5–8.1)

## 2017-03-30 LAB — D-DIMER, QUANTITATIVE: D-Dimer, Quant: 0.82 ug/mL-FEU — ABNORMAL HIGH (ref 0.00–0.50)

## 2017-03-30 LAB — CBC
HEMATOCRIT: 40.4 % (ref 39.0–52.0)
HEMOGLOBIN: 13.4 g/dL (ref 13.0–17.0)
MCH: 30.7 pg (ref 26.0–34.0)
MCHC: 33.2 g/dL (ref 30.0–36.0)
MCV: 92.4 fL (ref 78.0–100.0)
Platelets: 195 10*3/uL (ref 150–400)
RBC: 4.37 MIL/uL (ref 4.22–5.81)
RDW: 12.5 % (ref 11.5–15.5)
WBC: 7.9 10*3/uL (ref 4.0–10.5)

## 2017-03-30 LAB — URINALYSIS, ROUTINE W REFLEX MICROSCOPIC
Bacteria, UA: NONE SEEN
Bilirubin Urine: NEGATIVE
GLUCOSE, UA: NEGATIVE mg/dL
Ketones, ur: 80 mg/dL — AB
Leukocytes, UA: NEGATIVE
Nitrite: NEGATIVE
PH: 5 (ref 5.0–8.0)
Protein, ur: 30 mg/dL — AB
SPECIFIC GRAVITY, URINE: 1.029 (ref 1.005–1.030)

## 2017-03-30 LAB — CREATININE, SERUM
CREATININE: 0.88 mg/dL (ref 0.61–1.24)
GFR calc Af Amer: >60 mL/min (ref >60)
GFR calc non Af Amer: >60 mL/min (ref >60)

## 2017-03-30 LAB — I-STAT TROPONIN, ED: TROPONIN I, POC: 0 ng/mL (ref 0.00–0.08)

## 2017-03-30 LAB — STREP PNEUMONIAE URINARY ANTIGEN: STREP PNEUMO URINARY ANTIGEN: NEGATIVE

## 2017-03-30 LAB — I-STAT CG4 LACTIC ACID, ED
LACTIC ACID, VENOUS: 0.67 mmol/L (ref 0.5–1.9)
Lactic Acid, Venous: 1.43 mmol/L (ref 0.5–1.9)

## 2017-03-30 MED ORDER — ALBUTEROL SULFATE (2.5 MG/3ML) 0.083% IN NEBU
2.5000 mg | INHALATION_SOLUTION | RESPIRATORY_TRACT | Status: DC
  Administered 2017-03-30 – 2017-03-31 (×3): 2.5 mg via RESPIRATORY_TRACT
  Filled 2017-03-30 (×3): qty 3

## 2017-03-30 MED ORDER — IOPAMIDOL (ISOVUE-370) INJECTION 76%
INTRAVENOUS | Status: AC
  Administered 2017-03-30: 50 mL
  Filled 2017-03-30: qty 100

## 2017-03-30 MED ORDER — SODIUM CHLORIDE 0.9 % IV BOLUS (SEPSIS)
1000.0000 mL | Freq: Once | INTRAVENOUS | Status: AC
  Administered 2017-03-30: 1000 mL via INTRAVENOUS

## 2017-03-30 MED ORDER — PIPERACILLIN-TAZOBACTAM 3.375 G IVPB 30 MIN
3.3750 g | Freq: Once | INTRAVENOUS | Status: AC
  Administered 2017-03-30: 3.375 g via INTRAVENOUS
  Filled 2017-03-30: qty 50

## 2017-03-30 MED ORDER — ALBUTEROL SULFATE (2.5 MG/3ML) 0.083% IN NEBU: 2.5000 mg | INHALATION_SOLUTION | RESPIRATORY_TRACT | Status: DC | PRN

## 2017-03-30 MED ORDER — ACETAMINOPHEN 500 MG PO TABS: 1000.0000 mg | ORAL_TABLET | Freq: Once | ORAL | Status: DC

## 2017-03-30 MED ORDER — ACETAMINOPHEN 325 MG PO TABS: 650.0000 mg | ORAL_TABLET | Freq: Four times a day (QID) | ORAL | Status: DC | PRN

## 2017-03-30 MED ORDER — IPRATROPIUM-ALBUTEROL 0.5-2.5 (3) MG/3ML IN SOLN
3.0000 mL | Freq: Once | RESPIRATORY_TRACT | Status: AC
  Administered 2017-03-30: 3 mL via RESPIRATORY_TRACT
  Filled 2017-03-30: qty 3

## 2017-03-30 MED ORDER — PIPERACILLIN-TAZOBACTAM 3.375 G IVPB: 3.3750 g | Freq: Three times a day (TID) | INTRAVENOUS | Status: DC

## 2017-03-30 MED ORDER — ACETAMINOPHEN 650 MG RE SUPP: 650.0000 mg | Freq: Four times a day (QID) | RECTAL | Status: DC | PRN

## 2017-03-30 MED ORDER — ONDANSETRON HCL 4 MG/2ML IJ SOLN: 4.0000 mg | Freq: Four times a day (QID) | INTRAMUSCULAR | Status: DC | PRN

## 2017-03-30 MED ORDER — HYDRALAZINE HCL 20 MG/ML IJ SOLN: 10.0000 mg | INTRAMUSCULAR | Status: DC | PRN

## 2017-03-30 MED ORDER — ONDANSETRON HCL 4 MG PO TABS: 4.0000 mg | ORAL_TABLET | Freq: Four times a day (QID) | ORAL | Status: DC | PRN

## 2017-03-30 MED ORDER — VANCOMYCIN HCL IN DEXTROSE 750-5 MG/150ML-% IV SOLN
750.0000 mg | Freq: Three times a day (TID) | INTRAVENOUS | Status: DC
  Filled 2017-03-30: qty 150

## 2017-03-30 MED ORDER — BUDESONIDE 0.25 MG/2ML IN SUSP
0.2500 mg | Freq: Two times a day (BID) | RESPIRATORY_TRACT | Status: DC
  Administered 2017-03-31: 0.25 mg via RESPIRATORY_TRACT
  Filled 2017-03-30: qty 2

## 2017-03-30 MED ORDER — VANCOMYCIN HCL 10 G IV SOLR
1500.0000 mg | Freq: Once | INTRAVENOUS | Status: AC
  Administered 2017-03-30: 1500 mg via INTRAVENOUS
  Filled 2017-03-30: qty 1500

## 2017-03-30 MED ORDER — CEFTRIAXONE SODIUM 1 G IJ SOLR
1.0000 g | INTRAMUSCULAR | Status: DC
  Administered 2017-03-30 – 2017-03-31 (×2): 1 g via INTRAVENOUS
  Filled 2017-03-30 (×2): qty 10

## 2017-03-30 MED ORDER — IPRATROPIUM BROMIDE 0.02 % IN SOLN
0.5000 mg | RESPIRATORY_TRACT | Status: DC
  Administered 2017-03-30 – 2017-03-31 (×3): 0.5 mg via RESPIRATORY_TRACT
  Filled 2017-03-30 (×3): qty 2.5

## 2017-03-30 MED ORDER — ENOXAPARIN SODIUM 40 MG/0.4ML ~~LOC~~ SOLN
40.0000 mg | Freq: Every day | SUBCUTANEOUS | Status: DC
  Administered 2017-03-31 – 2017-04-01 (×2): 40 mg via SUBCUTANEOUS
  Filled 2017-03-30 (×3): qty 0.4

## 2017-03-30 MED ORDER — VANCOMYCIN HCL IN DEXTROSE 1-5 GM/200ML-% IV SOLN: 1000.0000 mg | Freq: Once | INTRAVENOUS | Status: DC

## 2017-03-30 MED ORDER — DEXTROSE 5 % IV SOLN
500.0000 mg | INTRAVENOUS | Status: DC
  Administered 2017-03-30 – 2017-03-31 (×2): 500 mg via INTRAVENOUS
  Filled 2017-03-30 (×2): qty 500

## 2017-03-30 MED ORDER — METHYLPREDNISOLONE SODIUM SUCC 40 MG IJ SOLR
40.0000 mg | Freq: Two times a day (BID) | INTRAMUSCULAR | Status: DC
  Administered 2017-03-30: 40 mg via INTRAVENOUS
  Filled 2017-03-30: qty 1

## 2017-03-30 MED ORDER — ACETAMINOPHEN 500 MG PO TABS
1000.0000 mg | ORAL_TABLET | Freq: Once | ORAL | Status: AC
Start: 2017-03-30 — End: 2017-03-30
  Administered 2017-03-30: 1000 mg via ORAL
  Filled 2017-03-30: qty 2

## 2017-03-30 MED ORDER — SODIUM CHLORIDE 0.9 % IV BOLUS (SEPSIS)
500.0000 mL | Freq: Once | INTRAVENOUS | Status: AC
  Administered 2017-03-30: 500 mL via INTRAVENOUS

## 2017-03-30 MED ORDER — IBUPROFEN 800 MG PO TABS
800.0000 mg | ORAL_TABLET | Freq: Once | ORAL | Status: AC
  Administered 2017-03-30: 800 mg via ORAL
  Filled 2017-03-30: qty 1

## 2017-03-30 NOTE — ED Triage Notes (Signed)
Pt reports not feeling well since new years. Pt has SOB, productive cough, hypertension, fevers. Pt noted to have spo2 at 88% room air. Placed on 3L increased to 94%

## 2017-03-30 NOTE — ED Notes (Signed)
Patient given urinal and is aware that urine specimen needed.

## 2017-03-30 NOTE — Progress Notes (Signed)
Pharmacy Antibiotic Note  Matthew [name] is a 57 y.o. male admitted on 03/30/2017 with sepsis.  Pharmacy has been consulted for Vancomycin/Zosyn dosing. Febrile with Temp up to 101.65F, Lactic acid less than 2, WBC and Scr WNL  Plan: Vancomycin 1500mg  IV x1  Vancomycin 750 IV every 8 hours.  Goal trough 15-20 mcg/mL. Zosyn 3.375g IV q8h (4 hour infusion).  F/u vanc trough, renal fxn, C&S, and clinical resolution  Height: 5\' 6"  (167.6 cm) Weight: 155 lb (70.3 kg) IBW/kg (Calculated) : 63.8  Temp (24hrs), Avg:100.4 F (38 C), Min:98.9 F (37.2 C), Max:101.8 F (38.8 C)  Recent Labs  Lab 03/30/17 1237 03/30/17 1257  WBC 10.4  --   CREATININE 0.88  --   LATICACIDVEN  --  1.43    Estimated Creatinine Clearance: 84.6 mL/min (by C-G formula based on SCr of 0.88 mg/dL).    No Known Allergies  Antimicrobials this admission: Vanc 1/3 >>  Zosyn 1/3 >>   Microbiology results: Pending  Thank you for allowing pharmacy to be a part of this patient's care.  Sharnika Binney 03/30/2017 4:57 PM

## 2017-03-30 NOTE — ED Provider Notes (Signed)
[facility] EMERGENCY DEPARTMENT Provider Note   CSN: [id number] Arrival date & time: 03/30/17  1218     History   Chief Complaint Chief Complaint  Patient presents with  . Shortness of Breath    HPI Matthew [name] is a 57 y.o. male with a h/o of HTN and central serous chorioretinopathy who presents to the emergency department with a chief complaint of constant dyspnea that began 4 days ago and significantly worsened today.  He reports associated intermittently productive cough for the last 4 days, anorexia, and chills for 1 day that have since resolved.  He denies fever, chest pain, myalgias, orthopnea, leg swelling, rash, back pain, abdominal pain, or N/V/D.  No history of similar.  He does not wear home oxygen.  No known aggravating or alleviating factors for his dyspnea.  No treatment prior to arrival.  No recent travel.  No recent surgery or immobilization.  No family history of DVT or PE.  No history of cardiovascular disease.   He lives alone, but has no sick contacts.  He is a current every day 1 pack/day smoker. Daily medications include eplerenone, which he recently discontinued.  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  No home hypertension medications.  The history is provided by the patient. No language interpreter was used.    Past Medical History:  Diagnosis Date  . Hypertension     Patient Active Problem List   Diagnosis Date Noted  . Acute otitis media, right 05/05/2016  . Pharyngitis 05/05/2016    Past Surgical History:  Procedure Laterality Date  . HERNIA REPAIR    . [place] Medications    Prior to Admission medications   Medication Sig Start Date End Date Taking? Authorizing Provider  amoxicillin-clavulanate (AUGMENTIN) 875-125 MG tablet Take 1 tablet by mouth 2 (two) times daily. Patient not taking: Reported on 03/30/2017 05/05/16   Mina Marble D, NP  Vitamin D,  Ergocalciferol, (DRISDOL) 50000 units CAPS capsule Take 1 capsule (50,000 Units total) by mouth every 7 (seven) days. Patient not taking: Reported on 03/30/2017 05/03/16   Esaw Grandchild, NP    Family History Family History  Problem Relation Age of Onset  . Lymphoma Father   . Cancer Mother        breast    Social History Social History   Tobacco Use  . Smoking status: Current Every Day Smoker    Packs/day: 1.00    Types: Cigarettes  . Smokeless tobacco: Never Used  Substance Use Topics  . Alcohol use: Yes    Alcohol/week: 1.2 - 2.4 oz    Types: 2 - 4 Standard drinks or equivalent per week    Comment: 7 cans of beer a week.  . Drug use: Yes    Frequency: 2.0 times per week    Types: Marijuana     Allergies   Patient has no known allergies.   Review of Systems Review of Systems  Constitutional: Positive for appetite change and chills (resolved). Negative for activity change and fever.  HENT: Negative for congestion.   Respiratory: Positive for shortness of breath.   Cardiovascular: Negative for chest pain and leg swelling.  Gastrointestinal: Negative for abdominal pain, diarrhea, nausea and vomiting.  Genitourinary: Negative for dysuria.  Musculoskeletal: Negative for back pain, myalgias, neck pain and neck stiffness.  Skin: Negative for rash.  Allergic/Immunologic: Negative for immunocompromised state.  Neurological: Negative for dizziness,  weakness and light-headedness.  Psychiatric/Behavioral: Negative for confusion.   Physical Exam Updated Vital Signs BP (!) 142/86   Pulse (!) 113   Temp (!) 101.8 F (38.8 C) (Axillary)   Resp 18   Ht 5\' 6"  (1.676 m)   Wt 70.3 kg (155 lb)   SpO2 93%   BMI 25.02 kg/m   Physical Exam  Constitutional: He appears well-developed. No distress. Nasal cannula in place.  HENT:  Head: Normocephalic.  Mouth/Throat: Oropharynx is clear and moist.  Eyes: Conjunctivae and EOM are normal. Pupils are equal, round, and reactive to  light.  Neck: Neck supple. No JVD present.  Cardiovascular: Regular rhythm and intact distal pulses. Tachycardia present. Exam reveals no gallop and no friction rub.  No murmur heard. Pulmonary/Chest: Effort normal. No respiratory distress. He exhibits no tenderness.  Coarse breath sounds heard bilaterally, greater with expiration.   Abdominal: Soft. Bowel sounds are normal. He exhibits no distension and no mass. There is no tenderness. There is no rebound and no guarding. No hernia.  Musculoskeletal: Normal range of motion. He exhibits no edema, tenderness or deformity.  Neurological: He is alert.  Skin: Skin is warm and dry. Capillary refill takes less than 2 seconds.  Psychiatric: His behavior is normal.  Nursing note and vitals reviewed.  ED Treatments / Results  Labs (all labs ordered are listed, but only abnormal results are displayed) Labs Reviewed  COMPREHENSIVE METABOLIC PANEL - Abnormal; Notable for the following components:      Result Value   Sodium 129 (*)    Chloride 96 (*)    CO2 21 (*)    Glucose, Bld 131 (*)    Calcium 8.7 (*)    All other components within normal limits  URINALYSIS, ROUTINE W REFLEX MICROSCOPIC - Abnormal; Notable for the following components:   APPearance HAZY (*)    Hgb urine dipstick SMALL (*)    Ketones, ur 80 (*)    Protein, ur 30 (*)    Squamous Epithelial / LPF 0-5 (*)    All other components within normal limits  D-DIMER, QUANTITATIVE (NOT AT [facility]) - Abnormal; Notable for the following components:   D-Dimer, Quant 0.82 (*)    All other components within normal limits  CULTURE, BLOOD (ROUTINE X 2)  CULTURE, BLOOD (ROUTINE X 2)  CBC WITH DIFFERENTIAL/PLATELET  BRAIN NATRIURETIC PEPTIDE  I-STAT CG4 LACTIC ACID, ED  I-STAT TROPONIN, ED    EKG  EKG Interpretation  Date/Time:  Thursday March 30 2017 12:23:02 EST Ventricular Rate:  130 PR Interval:  124 QRS Duration: 84 QT Interval:  306 QTC Calculation: 450 R Axis:   86 Text  Interpretation:  Sinus tachycardia Biatrial enlargement Nonspecific ST abnormality Abnormal ECG Confirmed by Pattricia Boss [phone]) on 03/30/2017 4:10:01 PM Also confirmed by Pattricia Boss [phone])  on 03/30/2017 4:10:54 PM       Radiology Dg Chest 2 View  Result Date: 03/30/2017 CLINICAL DATA:  Shortness of EXAM: CHEST  2 VIEW COMPARISON:  None. FINDINGS: The heart size and mediastinal contours are within normal limits. Both lungs are clear. The visualized skeletal structures are unremarkable. IMPRESSION: No active cardiopulmonary disease. Electronically Signed   By: Kathreen Devoid   On: 03/30/2017 13:14    Procedures Procedures (including critical care time)  Medications Ordered in ED Medications  piperacillin-tazobactam (ZOSYN) IVPB 3.375 g (not administered)  sodium chloride 0.9 % bolus 1,000 mL (not administered)  vancomycin (VANCOCIN) 1,500 mg in sodium chloride 0.9 % 500 mL IVPB (  not administered)  piperacillin-tazobactam (ZOSYN) IVPB 3.375 g (not administered)  vancomycin (VANCOCIN) IVPB 750 mg/150 ml premix (not administered)  sodium chloride 0.9 % bolus 500 mL (0 mLs Intravenous Stopped 03/30/17 1655)  acetaminophen (TYLENOL) tablet 1,000 mg (1,000 mg Oral Given 03/30/17 1642)     Initial Impression / Assessment and Plan / ED Course  I have reviewed the triage vital signs and the nursing notes.  Pertinent labs & imaging results that were available during my care of the patient were reviewed by me and considered in my medical decision making (see chart for details).     57 year old male with a history of hypertension and central serous chorioretinopathy presenting with dyspnea for 4 days, significantly worsening today and anorexia. No chest pain, tightness or discomfort. The patient was discussed with Dr. Jeanell Sparrow, attending physician.   One day of chills that resolved 4 days ago.  On arrival to the ED, he was hypoxic at 88% on room air and was placed on 3 L, increasing to 94% No h/o of  home oxygen use. Tachycardic in the 120-130s, improving to 110s after 500 mL fluid bolus while labs were pending. No tachypnea.   No leukocytosis. Hyponatremic at 129. D-dimer 0.82, CT chest angio pending. No acute changes on EKG. Troponin pending. On repeat vitals, the patient was febrile to 101.8. Tylenol ordered. Sepsis work up pending and vancomycin and Zosyn ordered. Patient care transferred to PA Harris at the end of my shift. Patient presentation, ED course, and plan of care discussed with review of all pertinent labs and imaging. Please see his/her note for further details regarding further ED course and disposition.   Final Clinical Impressions(s) / ED Diagnoses   Final diagnoses:  None    ED Discharge Orders    None       Joanne Gavel, PA-C 03/30/17 1702    Pattricia Boss, MD 03/30/17 1735

## 2017-03-30 NOTE — ED Notes (Signed)
Pt stated that he does not feel like the Tylenol did anything for his fever. Pt looks visibly sweaty and uncomfortable.

## 2017-03-30 NOTE — ED Notes (Signed)
EDP made aware of patient's temperature of 101.8

## 2017-03-30 NOTE — ED Notes (Signed)
Patient given graham crackers, peanut butter, and coke.

## 2017-03-30 NOTE — H&P (Addendum)
History and Physical    Matthew [name] VEL:[id number] DOB: 22-Dec-1960 DOA: 03/30/2017  PCP: Matthew Grandchild, NP  Patient coming from: Home.  Chief Complaint: Shortness of breath.  HPI: Matthew [name] is a 57 y.o. male with history of tobacco abuse and hypertension presently not on medications presents to the ER with complaints of worsening shortness of breath.  Patient symptoms started 4 days ago with fever and chills.  Since then patient has been having wheezing with shortness of breath.  Has benign productive cough which is acutely worsened today.  Has some chest pain on coughing otherwise chest pain-free.  Denies any recent travel or sick contacts.  ED Course: In the ER patient is found to be wheezing and EKG shows sinus tachycardia.  CT angiogram of the chest done shows bronchopneumonia with and possible bronchiolitis.  Patient is placed on empiric antibiotics and admitted for further management.  Review of Systems: As per HPI, rest all negative.   Past Medical History:  Diagnosis Date  . COPD (chronic obstructive pulmonary disease) ([place])   . Hypertension     Past Surgical History:  Procedure Laterality Date  . HERNIA REPAIR    . [place]     reports that he has been smoking cigarettes.  He has been smoking about 1.00 pack per day. he has never used smokeless tobacco. He reports that he drinks about 1.2 - 2.4 oz of alcohol per week. He reports that he uses drugs. Drug: Marijuana. Frequency: 2.00 times per week.  No Known Allergies  Family History  Problem Relation Age of Onset  . Lymphoma Father   . Cancer Mother        breast    Prior to Admission medications   Medication Sig Start Date End Date Taking? Authorizing Provider  amoxicillin-clavulanate (AUGMENTIN) 875-125 MG tablet Take 1 tablet by mouth 2 (two) times daily. Patient not taking: Reported on 03/30/2017 05/05/16   Mina Marble D, NP  Vitamin D, Ergocalciferol, (DRISDOL) 50000 units CAPS capsule  Take 1 capsule (50,000 Units total) by mouth every 7 (seven) days. Patient not taking: Reported on 03/30/2017 05/03/16   Mina Marble D, NP    Physical Exam: Vitals:   03/30/17 1945 03/30/17 2000 03/30/17 2029 03/30/17 2030  BP: (!) 150/89 (!) 143/77  (!) 143/85  Pulse: (!) 102 (!) 101  100  Resp: 19 17    Temp:   99.8 F (37.7 C)   TempSrc:   Oral   SpO2: 98% 93%  93%  Weight:      Height:          Constitutional: Moderately built and nourished. Vitals:   03/30/17 1945 03/30/17 2000 03/30/17 2029 03/30/17 2030  BP: (!) 150/89 (!) 143/77  (!) 143/85  Pulse: (!) 102 (!) 101  100  Resp: 19 17    Temp:   99.8 F (37.7 C)   TempSrc:   Oral   SpO2: 98% 93%  93%  Weight:      Height:       Eyes: Anicteric no pallor. ENMT: No discharge from the ears eyes nose or mouth. Neck: No mass palpated no JVD appreciated. Respiratory: Bilateral expiratory wheeze heard no crepitations. Cardiovascular: S1-S2 heard no murmurs appreciated. Abdomen: Soft nontender bowel sounds present. Musculoskeletal: No edema.  No joint effusion. Skin: No rash.  Skin appears warm. Neurologic: Alert awake oriented to time place and person.  Moves all extremities. Psychiatric: Appears normal.  Normal affect.  Labs on Admission: I have personally reviewed following labs and imaging studies  CBC: Recent Labs  Lab 03/30/17 1237  WBC 10.4  NEUTROABS 8.3  HGB 15.7  HCT 45.8  MCV 93.7  PLT 546   Basic Metabolic Panel: Recent Labs  Lab 03/30/17 1237  NA 129*  K 4.1  CL 96*  CO2 21*  GLUCOSE 131*  BUN 13  CREATININE 0.88  CALCIUM 8.7*   GFR: Estimated Creatinine Clearance: 84.6 mL/min (by C-G formula based on SCr of 0.88 mg/dL). Liver Function Tests: Recent Labs  Lab 03/30/17 1237  AST 27  ALT 21  ALKPHOS 74  BILITOT 0.9  PROT 7.1  ALBUMIN 3.9   No results for input(s): LIPASE, AMYLASE in the last 168 hours. No results for input(s): AMMONIA in the last 168 hours. Coagulation  Profile: No results for input(s): INR, PROTIME in the last 168 hours. Cardiac Enzymes: No results for input(s): CKTOTAL, CKMB, CKMBINDEX, TROPONINI in the last 168 hours. BNP (last 3 results) No results for input(s): PROBNP in the last 8760 hours. HbA1C: No results for input(s): HGBA1C in the last 72 hours. CBG: No results for input(s): GLUCAP in the last 168 hours. Lipid Profile: No results for input(s): CHOL, HDL, LDLCALC, TRIG, CHOLHDL, LDLDIRECT in the last 72 hours. Thyroid Function Tests: No results for input(s): TSH, T4TOTAL, FREET4, T3FREE, THYROIDAB in the last 72 hours. Anemia Panel: No results for input(s): VITAMINB12, FOLATE, FERRITIN, TIBC, IRON, RETICCTPCT in the last 72 hours. Urine analysis:    Component Value Date/Time   COLORURINE YELLOW 03/30/2017 1237   APPEARANCEUR HAZY (A) 03/30/2017 1237   LABSPEC 1.029 03/30/2017 1237   PHURINE 5.0 03/30/2017 1237   GLUCOSEU NEGATIVE 03/30/2017 1237   HGBUR SMALL (A) 03/30/2017 1237   BILIRUBINUR NEGATIVE 03/30/2017 1237   KETONESUR 80 (A) 03/30/2017 1237   PROTEINUR 30 (A) 03/30/2017 1237   NITRITE NEGATIVE 03/30/2017 1237   LEUKOCYTESUR NEGATIVE 03/30/2017 1237   Sepsis Labs: @LABRCNTIP (procalcitonin:4,lacticidven:4) )No results found for this or any previous visit (from the past 240 hour(s)).   Radiological Exams on Admission: Dg Chest 2 View  Result Date: 03/30/2017 CLINICAL DATA:  Shortness of EXAM: CHEST  2 VIEW COMPARISON:  None. FINDINGS: The heart size and mediastinal contours are within normal limits. Both lungs are clear. The visualized skeletal structures are unremarkable. IMPRESSION: No active cardiopulmonary disease. Electronically Signed   By: Kathreen Devoid   On: 03/30/2017 13:14   Ct Angio Chest Pe W And/or Wo Contrast  Result Date: 03/30/2017 CLINICAL DATA:  Chest pain, dyspnea and productive cough. EXAM: CT ANGIOGRAPHY CHEST WITH CONTRAST TECHNIQUE: Multidetector CT imaging of the chest was performed  using the standard protocol during bolus administration of intravenous contrast. Multiplanar CT image reconstructions and MIPs were obtained to evaluate the vascular anatomy. CONTRAST:  72mL ISOVUE-370 IOPAMIDOL (ISOVUE-370) INJECTION 76% COMPARISON:  CXR 03/30/2017 FINDINGS: Cardiovascular: The study is of quality for the evaluation of pulmonary embolism. There are no filling defects in the central, lobar, segmental or subsegmental pulmonary artery branches to suggest acute pulmonary embolism. Great vessels are normal in course and caliber. Normal heart size. No significant pericardial fluid/thickening. Coronary arteriosclerosis along the RCA and LAD. Mediastinum/Nodes: No discrete thyroid nodules. Unremarkable esophagus. No pathologically enlarged axillary, mediastinal or hilar lymph nodes. Normal caliber to aorta with minimal aortic atherosclerosis. Lungs/Pleura: No pneumothorax. No pleural effusion. Tree in bud densities involving the right upper, middle and lower lobes and to lesser degree left lower lobe compatible with bronchiolitis.  Superimposed areas of ill-defined acinar opacities raise concern for early changes of bronchopneumonia. No acute pneumonic consolidation. Upper abdomen: Unremarkable. Musculoskeletal:  No aggressive appearing focal osseous lesions. Review of the MIP images confirms the above findings. IMPRESSION: 1. Tree-in-bud as well as acinar densities, predominantly within the right lung and left lower lobe likely reflective of bronchiolitis and early bronchopneumonia. 2. No acute pulmonary embolus. 3. Coronary arteriosclerosis. Aortic Atherosclerosis (ICD10-I70.0). Electronically Signed   By: Ashley Royalty M.D.   On: 03/30/2017 18:19    EKG: Independently reviewed.  Sinus tachycardia.  Assessment/Plan Principal Problem:   Acute respiratory failure with hypoxia (HCC) Active Problems:   CAP (community acquired pneumonia)   Acute bronchitis    1. Acute respiratory failure with  hypoxia likely from developing pneumonia and bronchitis -patient has been placed on ceftriaxone and Zithromax.  Follow respiratory viral panel, urine for Legionella and strep antigen and cultures. 2. Acute bronchitis -patient has been placed on nebulizer treatment.  As per the patient's ophthalmologist note patient not to use steroids, steroids will be discontinued. 3. Elevated blood pressure with history of hypertension presently not on medications -closely follow blood pressure trends I have placed patient on PRN IV hydralazine.  Patient used to be on Eplerenone which patient discontinued himself 2 months ago.  Will restart if patient agrees. 4. Tobacco abuse -patient states he quit 3 days ago. 5. 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   DVT prophylaxis:  Code Status: Full code. Family Communication: Discussed with patient. Disposition Plan: Home. Consults called: None. Admission status: Inpatient.   Rise Patience MD [facility] Pager [phone].  If 7PM-7AM, please contact night-coverage [email or web address] Password [email or web address]  03/30/2017, 9:36 PM

## 2017-03-31 ENCOUNTER — Ambulatory Visit: Payer: 59 | Admitting: Adult Health

## 2017-03-31 ENCOUNTER — Encounter (HOSPITAL_COMMUNITY): Payer: Self-pay | Admitting: *Deleted

## 2017-03-31 ENCOUNTER — Other Ambulatory Visit: Payer: Self-pay

## 2017-03-31 DIAGNOSIS — J9601 Acute respiratory failure with hypoxia: Secondary | ICD-10-CM | POA: Diagnosis not present

## 2017-03-31 DIAGNOSIS — R0902 Hypoxemia: Secondary | ICD-10-CM | POA: Diagnosis not present

## 2017-03-31 DIAGNOSIS — J189 Pneumonia, unspecified organism: Secondary | ICD-10-CM | POA: Diagnosis not present

## 2017-03-31 HISTORY — DX: Pneumonia, unspecified organism: J18.9

## 2017-03-31 LAB — EXPECTORATED SPUTUM ASSESSMENT W REFEX TO RESP CULTURE

## 2017-03-31 LAB — BASIC METABOLIC PANEL
ANION GAP: 9 (ref 5–15)
BUN: 11 mg/dL (ref 6–20)
CALCIUM: 8.2 mg/dL — AB (ref 8.9–10.3)
CO2: 24 mmol/L (ref 22–32)
Chloride: 102 mmol/L (ref 101–111)
Creatinine, Ser: 0.85 mg/dL (ref 0.61–1.24)
GFR calc Af Amer: >60 mL/min (ref >60)
GLUCOSE: 133 mg/dL — AB (ref 65–99)
POTASSIUM: 4.2 mmol/L (ref 3.5–5.1)
Sodium: 135 mmol/L (ref 135–145)

## 2017-03-31 LAB — RESPIRATORY PANEL BY PCR
ADENOVIRUS-RVPPCR: NOT DETECTED
Bordetella pertussis: NOT DETECTED
CORONAVIRUS 229E-RVPPCR: NOT DETECTED
CORONAVIRUS HKU1-RVPPCR: NOT DETECTED
CORONAVIRUS NL63-RVPPCR: NOT DETECTED
CORONAVIRUS OC43-RVPPCR: NOT DETECTED
Chlamydophila pneumoniae: NOT DETECTED
Influenza A: NOT DETECTED
Influenza B: NOT DETECTED
METAPNEUMOVIRUS-RVPPCR: NOT DETECTED
Mycoplasma pneumoniae: NOT DETECTED
PARAINFLUENZA VIRUS 1-RVPPCR: NOT DETECTED
PARAINFLUENZA VIRUS 2-RVPPCR: NOT DETECTED
PARAINFLUENZA VIRUS 3-RVPPCR: NOT DETECTED
Parainfluenza Virus 4: NOT DETECTED
RHINOVIRUS / ENTEROVIRUS - RVPPCR: NOT DETECTED
Respiratory Syncytial Virus: DETECTED — AB

## 2017-03-31 LAB — CBC
HCT: 43.2 % (ref 39.0–52.0)
Hemoglobin: 14.3 g/dL (ref 13.0–17.0)
MCH: 31 pg (ref 26.0–34.0)
MCHC: 33.1 g/dL (ref 30.0–36.0)
MCV: 93.5 fL (ref 78.0–100.0)
Platelets: 199 10*3/uL (ref 150–400)
RBC: 4.62 MIL/uL (ref 4.22–5.81)
RDW: 12.6 % (ref 11.5–15.5)
WBC: 6.3 10*3/uL (ref 4.0–10.5)

## 2017-03-31 LAB — EXPECTORATED SPUTUM ASSESSMENT W GRAM STAIN, RFLX TO RESP C

## 2017-03-31 LAB — STREP PNEUMONIAE URINARY ANTIGEN: Strep Pneumo Urinary Antigen: NEGATIVE

## 2017-03-31 MED ORDER — IPRATROPIUM-ALBUTEROL 0.5-2.5 (3) MG/3ML IN SOLN
3.0000 mL | RESPIRATORY_TRACT | Status: DC
  Administered 2017-03-31 (×3): 3 mL via RESPIRATORY_TRACT
  Filled 2017-03-31 (×3): qty 3

## 2017-03-31 MED ORDER — IPRATROPIUM-ALBUTEROL 0.5-2.5 (3) MG/3ML IN SOLN
3.0000 mL | Freq: Three times a day (TID) | RESPIRATORY_TRACT | Status: DC
  Administered 2017-03-31 – 2017-04-01 (×2): 3 mL via RESPIRATORY_TRACT
  Filled 2017-03-31 (×2): qty 3

## 2017-03-31 NOTE — Progress Notes (Signed)
PROGRESS NOTE    Matthew [name]  UMP:[id number] DOB: [birth year] DOA: 03/30/2017 PCP: Esaw Grandchild, NP    Brief Narrative:  57 y.o. male with history of tobacco abuse and hypertension presently not on medications presents to the ER with complaints of worsening shortness of breath.  Patient symptoms started 4 days ago with fever and chills.  Since then patient has been having wheezing with shortness of breath.  Has benign productive cough which is acutely worsened today.  Has some chest pain on coughing otherwise chest pain-free.  Denies any recent travel or sick contacts.  In the ER patient is found to be wheezing and EKG shows sinus tachycardia.  CT angiogram of the chest done shows bronchopneumonia with and possible bronchiolitis.  Patient is placed on empiric antibiotics and admitted for further management.   Assessment & Plan:   Principal Problem:   Acute respiratory failure with hypoxia (HCC) Active Problems:   CAP (community acquired pneumonia)   Acute bronchitis  1. Acute respiratory failure with hypoxia likely from developing pneumonia and bronchitis  1. patient has been started on empiric ceftriaxone and Zithromax.   2. Respiratory viral panel positive for RSV 3. Urine for Legionella pending 4. Urine strep pneumo neg. 2. Acute bronchitis - 1. patient continued on nebulizer treatment.   2. As per the patient's ophthalmologist note patient not to use steroids 3. Continued wheezing and decreased BS on exam this AM 3. Elevated blood pressure with history of hypertension presently not on medications  1. closely follow blood pressure trends 2. Patient continued on PRN IV hydralazine.  4. Tobacco abuse 1. patient states he quit 3 days prior to admit 5. Central serous corneal retinopathy  1. Pt followed ophthalmologist at [facility]. 2. Appears to be stable at this time  DVT prophylaxis: Lovenox subQ Code Status: Full Family Communication: Pt in room Disposition Plan: Uncertain  at this time  Consultants:     Procedures:     Antimicrobials: Anti-infectives (From admission, onward)   Start     Dose/Rate Route Frequency Ordered Stop   03/31/17 0200  vancomycin (VANCOCIN) IVPB 750 mg/150 ml premix  Status:  Discontinued     750 mg 150 mL/hr over 60 Minutes Intravenous Every 8 hours 03/30/17 1654 03/30/17 2129   03/30/17 2300  piperacillin-tazobactam (ZOSYN) IVPB 3.375 g  Status:  Discontinued     3.375 g 12.5 mL/hr over 240 Minutes Intravenous Every 8 hours 03/30/17 1652 03/30/17 2129   03/30/17 2230  azithromycin (ZITHROMAX) 500 mg in dextrose 5 % 250 mL IVPB     500 mg 250 mL/hr over 60 Minutes Intravenous Every 24 hours 03/30/17 2129 04/06/17 2229   03/30/17 2200  cefTRIAXone (ROCEPHIN) 1 g in dextrose 5 % 50 mL IVPB     1 g 100 mL/hr over 30 Minutes Intravenous Every 24 hours 03/30/17 2129 04/06/17 2159   03/30/17 1700  vancomycin (VANCOCIN) 1,500 mg in sodium chloride 0.9 % 500 mL IVPB     1,500 mg 250 mL/hr over 120 Minutes Intravenous  Once 03/30/17 1649 03/30/17 2028   03/30/17 1645  piperacillin-tazobactam (ZOSYN) IVPB 3.375 g     3.375 g 100 mL/hr over 30 Minutes Intravenous  Once 03/30/17 1644 03/30/17 1824   03/30/17 1645  vancomycin (VANCOCIN) IVPB 1000 mg/200 mL premix  Status:  Discontinued     1,000 mg 200 mL/hr over 60 Minutes Intravenous  Once 03/30/17 1644 03/30/17 1649       Subjective: Still wheezing although  somewhat better this AM  Objective: Vitals:   03/31/17 1100 03/31/17 1229 03/31/17 1300 03/31/17 1345  BP: (!) 153/83  (!) 143/85 (!) 151/79  Pulse: 89  (!) 102 (!) 109  Resp: (!) 26  (!) 23 (!) 23  Temp:    98.8 F (37.1 C)  TempSrc:    Oral  SpO2: 96% 96% 92% 96%  Weight:    69.8 kg (153 lb 14.4 oz)  Height:    5\' 7"  (1.702 m)    Intake/Output Summary (Last 24 hours) at 03/31/2017 1512 Last data filed at 03/30/2017 2310 Gross per 24 hour  Intake 2350 ml  Output -  Net 2350 ml   Filed Weights   03/30/17 1614  03/31/17 1345  Weight: 70.3 kg (155 lb) 69.8 kg (153 lb 14.4 oz)    Examination: General exam: Awake, laying in bed, in nad Respiratory system: Normal resp effort, end expiratory effort, decreased BS Cardiovascular system: regular rate, s1, s2 Gastrointestinal system: Soft, nondistended, positive BS Central nervous system: CN2-12 grossly intact, strength intact Extremities: Perfused, no clubbing Skin: Normal skin turgor, no notable skin lesions seen Psychiatry: Mood normal // no visual hallucinations    Data Reviewed: I have personally reviewed following labs and imaging studies  CBC: Recent Labs  Lab 03/30/17 1237 03/30/17 2207 03/31/17 0317  WBC 10.4 7.9 6.3  NEUTROABS 8.3  --   --   HGB 15.7 13.4 14.3  HCT 45.8 40.4 43.2  MCV 93.7 92.4 93.5  PLT 223 195 536   Basic Metabolic Panel: Recent Labs  Lab 03/30/17 1237 03/30/17 2207 03/31/17 0317  NA 129*  --  135  K 4.1  --  4.2  CL 96*  --  102  CO2 21*  --  24  GLUCOSE 131*  --  133*  BUN 13  --  11  CREATININE 0.88 0.88 0.85  CALCIUM 8.7*  --  8.2*   GFR: Estimated Creatinine Clearance: 90.7 mL/min (by C-G formula based on SCr of 0.85 mg/dL). Liver Function Tests: Recent Labs  Lab 03/30/17 1237  AST 27  ALT 21  ALKPHOS 74  BILITOT 0.9  PROT 7.1  ALBUMIN 3.9   No results for input(s): LIPASE, AMYLASE in the last 168 hours. No results for input(s): AMMONIA in the last 168 hours. Coagulation Profile: No results for input(s): INR, PROTIME in the last 168 hours. Cardiac Enzymes: No results for input(s): CKTOTAL, CKMB, CKMBINDEX, TROPONINI in the last 168 hours. BNP (last 3 results) No results for input(s): PROBNP in the last 8760 hours. HbA1C: No results for input(s): HGBA1C in the last 72 hours. CBG: No results for input(s): GLUCAP in the last 168 hours. Lipid Profile: No results for input(s): CHOL, HDL, LDLCALC, TRIG, CHOLHDL, LDLDIRECT in the last 72 hours. Thyroid Function Tests: No results for  input(s): TSH, T4TOTAL, FREET4, T3FREE, THYROIDAB in the last 72 hours. Anemia Panel: No results for input(s): VITAMINB12, FOLATE, FERRITIN, TIBC, IRON, RETICCTPCT in the last 72 hours. Sepsis Labs: Recent Labs  Lab 03/30/17 1257 03/30/17 1722  LATICACIDVEN 1.43 0.67    Recent Results (from the past 240 hour(s))  Blood Culture (routine x 2)     Status: None (Preliminary result)   Collection Time: 03/30/17  4:44 PM  Result Value Ref Range Status   Specimen Description BLOOD RIGHT ANTECUBITAL  Final   Special Requests   Final    BOTTLES DRAWN AEROBIC AND ANAEROBIC Blood Culture adequate volume   Culture PENDING  Incomplete  Report Status PENDING  Incomplete  Blood Culture (routine x 2)     Status: None (Preliminary result)   Collection Time: 03/30/17  4:49 PM  Result Value Ref Range Status   Specimen Description BLOOD LEFT ANTECUBITAL  Final   Special Requests   Final    BOTTLES DRAWN AEROBIC AND ANAEROBIC Blood Culture adequate volume   Culture PENDING  Incomplete   Report Status PENDING  Incomplete  Respiratory Panel by PCR     Status: Abnormal   Collection Time: 03/30/17  9:30 PM  Result Value Ref Range Status   Adenovirus NOT DETECTED NOT DETECTED Final   Coronavirus 229E NOT DETECTED NOT DETECTED Final   Coronavirus HKU1 NOT DETECTED NOT DETECTED Final   Coronavirus NL63 NOT DETECTED NOT DETECTED Final   Coronavirus OC43 NOT DETECTED NOT DETECTED Final   Metapneumovirus NOT DETECTED NOT DETECTED Final   Rhinovirus / Enterovirus NOT DETECTED NOT DETECTED Final   Influenza A NOT DETECTED NOT DETECTED Final   Influenza B NOT DETECTED NOT DETECTED Final   Parainfluenza Virus 1 NOT DETECTED NOT DETECTED Final   Parainfluenza Virus 2 NOT DETECTED NOT DETECTED Final   Parainfluenza Virus 3 NOT DETECTED NOT DETECTED Final   Parainfluenza Virus 4 NOT DETECTED NOT DETECTED Final   Respiratory Syncytial Virus DETECTED (A) NOT DETECTED Final    Comment: CRITICAL RESULT CALLED TO,  READ BACK BY AND VERIFIED WITH: K. COBB,RN [id number] 03/31/2017 T. TYSOR    Bordetella pertussis NOT DETECTED NOT DETECTED Final   Chlamydophila pneumoniae NOT DETECTED NOT DETECTED Final   Mycoplasma pneumoniae NOT DETECTED NOT DETECTED Final  Culture, sputum-assessment     Status: None   Collection Time: 03/30/17 11:01 PM  Result Value Ref Range Status   Specimen Description EXPECTORATED SPUTUM  Final   Special Requests NONE  Final   Sputum evaluation THIS SPECIMEN IS ACCEPTABLE FOR SPUTUM CULTURE  Final   Report Status 03/31/2017 FINAL  Final  Culture, respiratory (NON-Expectorated)     Status: None (Preliminary result)   Collection Time: 03/30/17 11:01 PM  Result Value Ref Range Status   Specimen Description EXPECTORATED SPUTUM  Final   Special Requests NONE Reflexed from [id number]  Final   Gram Stain   Final    ABUNDANT WBC PRESENT,BOTH PMN AND MONONUCLEAR FEW GRAM POSITIVE COCCI FEW GRAM NEGATIVE RODS    Culture PENDING  Incomplete   Report Status PENDING  Incomplete     Radiology Studies: Dg Chest 2 View  Result Date: 03/30/2017 CLINICAL DATA:  Shortness of EXAM: CHEST  2 VIEW COMPARISON:  None. FINDINGS: The heart size and mediastinal contours are within normal limits. Both lungs are clear. The visualized skeletal structures are unremarkable. IMPRESSION: No active cardiopulmonary disease. Electronically Signed   By: Kathreen Devoid   On: 03/30/2017 13:14   Ct Angio Chest Pe W And/or Wo Contrast  Result Date: 03/30/2017 CLINICAL DATA:  Chest pain, dyspnea and productive cough. EXAM: CT ANGIOGRAPHY CHEST WITH CONTRAST TECHNIQUE: Multidetector CT imaging of the chest was performed using the standard protocol during bolus administration of intravenous contrast. Multiplanar CT image reconstructions and MIPs were obtained to evaluate the vascular anatomy. CONTRAST:  39mL ISOVUE-370 IOPAMIDOL (ISOVUE-370) INJECTION 76% COMPARISON:  CXR 03/30/2017 FINDINGS: Cardiovascular: The study is of  quality for the evaluation of pulmonary embolism. There are no filling defects in the central, lobar, segmental or subsegmental pulmonary artery branches to suggest acute pulmonary embolism. Great vessels are normal in course and caliber. Normal heart  size. No significant pericardial fluid/thickening. Coronary arteriosclerosis along the RCA and LAD. Mediastinum/Nodes: No discrete thyroid nodules. Unremarkable esophagus. No pathologically enlarged axillary, mediastinal or hilar lymph nodes. Normal caliber to aorta with minimal aortic atherosclerosis. Lungs/Pleura: No pneumothorax. No pleural effusion. Tree in bud densities involving the right upper, middle and lower lobes and to lesser degree left lower lobe compatible with bronchiolitis. Superimposed areas of ill-defined acinar opacities raise concern for early changes of bronchopneumonia. No acute pneumonic consolidation. Upper abdomen: Unremarkable. Musculoskeletal:  No aggressive appearing focal osseous lesions. Review of the MIP images confirms the above findings. IMPRESSION: 1. Tree-in-bud as well as acinar densities, predominantly within the right lung and left lower lobe likely reflective of bronchiolitis and early bronchopneumonia. 2. No acute pulmonary embolus. 3. Coronary arteriosclerosis. Aortic Atherosclerosis (ICD10-I70.0). Electronically Signed   By: Ashley Royalty M.D.   On: 03/30/2017 18:19    Scheduled Meds: . enoxaparin (LOVENOX) injection  40 mg Subcutaneous Daily  . ipratropium-albuterol  3 mL Nebulization Q4H   Continuous Infusions: . azithromycin Stopped (03/30/17 2310)  . cefTRIAXone (ROCEPHIN)  IV Stopped (03/30/17 2239)     LOS: 1 day   Marylu Lund, MD [facility] Pager [phone]  If 7PM-7AM, please contact night-coverage [email or web address] Password [email or web address] 03/31/2017, 3:12 PM

## 2017-03-31 NOTE — ED Provider Notes (Signed)
[name] taken in sign out from Matthew [name] here with sob/ cough. He has had a cold for 2 weeks that suddenly worsened Jan 1 with chills, fever. Today he was markedly SOB with hypoxia at triage. [name] states he quit smoking on Jan 1.  [name] became febrile during the course of his ED visit" absent was initiated.  His CT Angio for PE is negative however does show he has a pneumonia.  He will be admitted for hypoxia and community-acquired pneumonia.  On my examination the [name] is also still febrile with wheezing.  I have ordered a DuoNeb and Motrin.   Margarita Mail, PA-C 03/31/17 [id number]    Malvin Johns, MD 04/01/17 [phone]

## 2017-03-31 NOTE — Progress Notes (Signed)
Patient trasfered from ED to [phone] via stretcher; alert and oriented x 4; no complaints of pain; IV saline locked in LFA and RAC; skin intact. Orient patient to room and unit; gave patient care guide; instructed how to use the call bell and  fall risk precautions. Will continue to monitor the patient.

## 2017-03-31 NOTE — ED Notes (Signed)
Admitting paged re: positive RSV results, pt placed on droplet precautions

## 2017-03-31 NOTE — ED Notes (Signed)
Heart Healthy Diet Ordered for Lunch.

## 2017-03-31 NOTE — Progress Notes (Deleted)
   Subjective:    Patient ID: Matthew [name], male    DOB: [birth year], 57 y.o.   MRN: [id number]  HPI:  04/11/16 Matthew [name] presents to establish as a new pt.  He has not been working with a PCP, however using Urgent Care for all health care needs.  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 He is smoking "about a pack a day", since high school.    03/31/16 OV: Matthew [name] presents for hospital f/u- SOB Of Note: He was initially seen Jan 2017, BP not controlled and advised to f/u in 4 weeks He was seen 04/2016 for acute illness, however never followed up for HTN   Review of Systems  Constitutional: Negative for activity change, appetite change, chills, diaphoresis, fatigue, fever and unexpected weight change.  HENT: Positive for congestion, postnasal drip and sinus pressure. Negative for trouble swallowing and voice change.   Eyes: Positive for discharge, redness and itching. Negative for visual disturbance.  Respiratory: Negative for chest tightness, shortness of breath and wheezing.   Cardiovascular: Negative for chest pain, palpitations and leg swelling.  Gastrointestinal: Negative for constipation, diarrhea and nausea.  Endocrine: Negative for cold intolerance, heat intolerance, polydipsia and polyphagia.  Genitourinary: Negative for difficulty urinating.  Musculoskeletal: Positive for arthralgias and myalgias. Negative for gait problem.  Skin: Negative for color change.  Neurological: Negative for dizziness, speech difficulty and numbness.  Psychiatric/Behavioral: Negative for agitation and behavioral problems.       Objective:   Physical Exam  Constitutional: He is oriented to person, place, and time. He appears well-developed and well-nourished. No distress.  HENT:  Head: Normocephalic and atraumatic.  Eyes: Conjunctivae are normal. Pupils are equal, round, and reactive to light.  Neck: Normal range of motion. Neck supple. No tracheal deviation  present. No thyromegaly present.  Cardiovascular: Normal rate, regular rhythm, normal heart sounds and intact distal pulses.  Pulmonary/Chest: Effort normal and breath sounds normal. No respiratory distress. He has no wheezes. He has no rales. He exhibits no tenderness.  Abdominal: Soft. Bowel sounds are normal. There is no tenderness.  Musculoskeletal: Normal range of motion. He exhibits tenderness.       Right shoulder: He exhibits tenderness.       Left shoulder: He exhibits tenderness.       Right elbow: Tenderness found.       Left elbow: Tenderness found.       Thoracic back: He exhibits tenderness.       Lumbar back: He exhibits tenderness.  Neurological: He is alert and oriented to person, place, and time. He has normal reflexes.  Skin: Skin is warm and dry. He is not diaphoretic.  Psychiatric: He has a normal mood and affect. His behavior is normal. Judgment and thought content normal.  Nursing note and vitals reviewed.         Assessment & Plan:  1. HTN-Eplerone as directed.  Reduce sodium and processed foods.  Reduce tobacco use.  2.  Fatigue-increase nightly sleep, increase water intake. 3. Myalgia-recommend daily stretching, warm Epson salt baths. 4.  AMD-continue with Optometrist as directed. Continue Rx eye gtt's. Return for fasting labs ASAP. Return in 4 weeks for BP re-check.   All questions/concerns answered.

## 2017-04-01 DIAGNOSIS — J189 Pneumonia, unspecified organism: Secondary | ICD-10-CM | POA: Diagnosis not present

## 2017-04-01 DIAGNOSIS — R0902 Hypoxemia: Secondary | ICD-10-CM | POA: Diagnosis not present

## 2017-04-01 DIAGNOSIS — J9601 Acute respiratory failure with hypoxia: Secondary | ICD-10-CM | POA: Diagnosis not present

## 2017-04-01 LAB — CBC
HEMATOCRIT: 41.6 % (ref 39.0–52.0)
HEMOGLOBIN: 13.7 g/dL (ref 13.0–17.0)
MCH: 30.9 pg (ref 26.0–34.0)
MCHC: 32.9 g/dL (ref 30.0–36.0)
MCV: 93.7 fL (ref 78.0–100.0)
Platelets: 209 10*3/uL (ref 150–400)
RBC: 4.44 MIL/uL (ref 4.22–5.81)
RDW: 12.7 % (ref 11.5–15.5)
WBC: 6.5 10*3/uL (ref 4.0–10.5)

## 2017-04-01 LAB — BASIC METABOLIC PANEL
ANION GAP: 9 (ref 5–15)
BUN: 9 mg/dL (ref 6–20)
CHLORIDE: 97 mmol/L — AB (ref 101–111)
CO2: 28 mmol/L (ref 22–32)
Calcium: 8.3 mg/dL — ABNORMAL LOW (ref 8.9–10.3)
Creatinine, Ser: 0.84 mg/dL (ref 0.61–1.24)
Glucose, Bld: 105 mg/dL — ABNORMAL HIGH (ref 65–99)
POTASSIUM: 3.3 mmol/L — AB (ref 3.5–5.1)
SODIUM: 134 mmol/L — AB (ref 135–145)

## 2017-04-01 LAB — HIV ANTIBODY (ROUTINE TESTING W REFLEX): HIV SCREEN 4TH GENERATION: NONREACTIVE

## 2017-04-01 LAB — LEGIONELLA PNEUMOPHILA SEROGP 1 UR AG: L. pneumophila Serogp 1 Ur Ag: NEGATIVE

## 2017-04-01 MED ORDER — POTASSIUM CHLORIDE CRYS ER 20 MEQ PO TBCR
40.0000 meq | EXTENDED_RELEASE_TABLET | Freq: Once | ORAL | Status: AC
  Administered 2017-04-01: 40 meq via ORAL
  Filled 2017-04-01: qty 2

## 2017-04-01 MED ORDER — CEFUROXIME AXETIL 250 MG PO TABS: 250.0000 mg | ORAL_TABLET | Freq: Two times a day (BID) | ORAL | 0 refills | Status: AC

## 2017-04-01 MED ORDER — ALBUTEROL SULFATE HFA 108 (90 BASE) MCG/ACT IN AERS: 2.0000 | INHALATION_SPRAY | RESPIRATORY_TRACT | 0 refills | Status: DC | PRN

## 2017-04-01 MED ORDER — AZITHROMYCIN 250 MG PO TABS: ORAL_TABLET | ORAL | 0 refills | Status: DC

## 2017-04-01 MED ORDER — POTASSIUM CHLORIDE 10 MEQ/100ML IV SOLN
INTRAVENOUS | Status: AC
  Filled 2017-04-01: qty 100

## 2017-04-01 NOTE — Discharge Summary (Signed)
Physician Discharge Summary  Matthew [name] BHA:[id number] DOB: 1961/02/27 DOA: 03/30/2017  PCP: Esaw Grandchild, NP  Admit date: 03/30/2017 Discharge date: 04/01/2017  Admitted From: Home Disposition:  Home  Recommendations for Outpatient Follow-up:  1. Follow up with PCP in 2-3 weeks  Discharge Condition:Improved CODE STATUS:Full Diet recommendation: Regular   Brief/Interim Summary: 57 y.o.malewithhistory of tobacco abuse and hypertension presently not on medications presents to the ER with complaints of worsening shortness of breath. Patient symptoms started 4 days ago with fever and chills. Since then patient has been having wheezing with shortness of breath. Has benign productive cough which is acutely worsened today. Has some chest pain on coughing otherwise chest pain-free. Denies any recent travel or sick contacts.  In the ER patient is found to be wheezing and EKG shows sinus tachycardia. CT angiogram of the chest done shows bronchopneumonia with and possible bronchiolitis. Patient is placed on empiric antibiotics and admitted for further management.  1. Acute respiratory failure with hypoxia likely from developing pneumonia and bronchitis 1. patient was started on empiric ceftriaxone and Zithromax.  2. Respiratory viral panel positive for RSV 3. Urine for Legionella neg 4. Urine strep pneumo neg. 2. Acute bronchitis 1. patient continued on nebulizertreatment.  2. As per the patient's ophthalmologist note patient not to use steroids 3. Clinically improved 3. Elevated blood pressure with history of hypertension presently not on medications 1. Remained clinically stable.  4. Tobacco abuse 1. patient states he quit 3 days prior to admit 5. Central serous corneal retinopathy  1. Pt followed ophthalmologist at [facility]. 2. Appears to be stable at this time     Discharge Diagnoses:  Principal Problem:   Acute respiratory failure with hypoxia ([place]) Active  Problems:   CAP (community acquired pneumonia)   Acute bronchitis    Discharge Instructions   Allergies as of 04/01/2017   No Known Allergies     Medication List    STOP taking these medications   amoxicillin-clavulanate 875-125 MG tablet Commonly known as:  AUGMENTIN     TAKE these medications   albuterol 108 (90 Base) MCG/ACT inhaler Commonly known as:  PROVENTIL HFA;VENTOLIN HFA Inhale 2 puffs into the lungs every 4 (four) hours as needed for wheezing or shortness of breath.   azithromycin 250 MG tablet Commonly known as:  ZITHROMAX Z-PAK 1 tab po qday for 4 more days   cefUROXime 250 MG tablet Commonly known as:  CEFTIN Take 1 tablet (250 mg total) by mouth 2 (two) times daily for 4 days.   Vitamin D (Ergocalciferol) 50000 units Caps capsule Commonly known as:  DRISDOL Take 1 capsule (50,000 Units total) by mouth every 7 (seven) days.       No Known Allergies    Procedures/Studies: Dg Chest 2 View  Result Date: 03/30/2017 CLINICAL DATA:  Shortness of EXAM: CHEST  2 VIEW COMPARISON:  None. FINDINGS: The heart size and mediastinal contours are within normal limits. Both lungs are clear. The visualized skeletal structures are unremarkable. IMPRESSION: No active cardiopulmonary disease. Electronically Signed   By: Kathreen Devoid   On: 03/30/2017 13:14   Ct Angio Chest Pe W And/or Wo Contrast  Result Date: 03/30/2017 CLINICAL DATA:  Chest pain, dyspnea and productive cough. EXAM: CT ANGIOGRAPHY CHEST WITH CONTRAST TECHNIQUE: Multidetector CT imaging of the chest was performed using the standard protocol during bolus administration of intravenous contrast. Multiplanar CT image reconstructions and MIPs were obtained to evaluate the vascular anatomy. CONTRAST:  48mL ISOVUE-370 IOPAMIDOL (ISOVUE-370)  INJECTION 76% COMPARISON:  CXR 03/30/2017 FINDINGS: Cardiovascular: The study is of quality for the evaluation of pulmonary embolism. There are no filling defects in the central,  lobar, segmental or subsegmental pulmonary artery branches to suggest acute pulmonary embolism. Great vessels are normal in course and caliber. Normal heart size. No significant pericardial fluid/thickening. Coronary arteriosclerosis along the RCA and LAD. Mediastinum/Nodes: No discrete thyroid nodules. Unremarkable esophagus. No pathologically enlarged axillary, mediastinal or hilar lymph nodes. Normal caliber to aorta with minimal aortic atherosclerosis. Lungs/Pleura: No pneumothorax. No pleural effusion. Tree in bud densities involving the right upper, middle and lower lobes and to lesser degree left lower lobe compatible with bronchiolitis. Superimposed areas of ill-defined acinar opacities raise concern for early changes of bronchopneumonia. No acute pneumonic consolidation. Upper abdomen: Unremarkable. Musculoskeletal:  No aggressive appearing focal osseous lesions. Review of the MIP images confirms the above findings. IMPRESSION: 1. Tree-in-bud as well as acinar densities, predominantly within the right lung and left lower lobe likely reflective of bronchiolitis and early bronchopneumonia. 2. No acute pulmonary embolus. 3. Coronary arteriosclerosis. Aortic Atherosclerosis (ICD10-I70.0). Electronically Signed   By: Ashley Royalty M.D.   On: 03/30/2017 18:19     Subjective: Without complaints  Discharge Exam: Vitals:   03/31/17 2122 04/01/17 0514  BP: (!) 148/78 (!) 148/79  Pulse: 90 84  Resp: 12 18  Temp: 99.7 F (37.6 C) 98.8 F (37.1 C)  SpO2: 94% 97%   Vitals:   03/31/17 1520 03/31/17 1929 03/31/17 2122 04/01/17 0514  BP:  (!) 150/69 (!) 148/78 (!) 148/79  Pulse:  98 90 84  Resp:   12 18  Temp:  98.8 F (37.1 C) 99.7 F (37.6 C) 98.8 F (37.1 C)  TempSrc:  Oral Oral Oral  SpO2: 97% 96% 94% 97%  Weight:      Height:        General: Pt is alert, awake, not in acute distress Cardiovascular: RRR, S1/S2 +, no rubs, no gallops Respiratory: CTA bilaterally, no wheezing, no  rhonchi Abdominal: Soft, NT, ND, bowel sounds + Extremities: no edema, no cyanosis   The results of significant diagnostics from this hospitalization (including imaging, microbiology, ancillary and laboratory) are listed below for reference.     Microbiology: Recent Results (from the past 240 hour(s))  Blood Culture (routine x 2)     Status: None (Preliminary result)   Collection Time: 03/30/17  4:44 PM  Result Value Ref Range Status   Specimen Description BLOOD RIGHT ANTECUBITAL  Final   Special Requests   Final    BOTTLES DRAWN AEROBIC AND ANAEROBIC Blood Culture adequate volume   Culture NO GROWTH < 24 HOURS  Final   Report Status PENDING  Incomplete  Blood Culture (routine x 2)     Status: None (Preliminary result)   Collection Time: 03/30/17  4:49 PM  Result Value Ref Range Status   Specimen Description BLOOD LEFT ANTECUBITAL  Final   Special Requests   Final    BOTTLES DRAWN AEROBIC AND ANAEROBIC Blood Culture adequate volume   Culture NO GROWTH < 24 HOURS  Final   Report Status PENDING  Incomplete  Respiratory Panel by PCR     Status: Abnormal   Collection Time: 03/30/17  9:30 PM  Result Value Ref Range Status   Adenovirus NOT DETECTED NOT DETECTED Final   Coronavirus 229E NOT DETECTED NOT DETECTED Final   Coronavirus HKU1 NOT DETECTED NOT DETECTED Final   Coronavirus NL63 NOT DETECTED NOT DETECTED Final   Coronavirus  OC43 NOT DETECTED NOT DETECTED Final   Metapneumovirus NOT DETECTED NOT DETECTED Final   Rhinovirus / Enterovirus NOT DETECTED NOT DETECTED Final   Influenza A NOT DETECTED NOT DETECTED Final   Influenza B NOT DETECTED NOT DETECTED Final   Parainfluenza Virus 1 NOT DETECTED NOT DETECTED Final   Parainfluenza Virus 2 NOT DETECTED NOT DETECTED Final   Parainfluenza Virus 3 NOT DETECTED NOT DETECTED Final   Parainfluenza Virus 4 NOT DETECTED NOT DETECTED Final   Respiratory Syncytial Virus DETECTED (A) NOT DETECTED Final    Comment: CRITICAL RESULT CALLED  TO, READ BACK BY AND VERIFIED WITH: K. COBB,RN [id number] 03/31/2017 T. TYSOR    Bordetella pertussis NOT DETECTED NOT DETECTED Final   Chlamydophila pneumoniae NOT DETECTED NOT DETECTED Final   Mycoplasma pneumoniae NOT DETECTED NOT DETECTED Final  Culture, sputum-assessment     Status: None   Collection Time: 03/30/17 11:01 PM  Result Value Ref Range Status   Specimen Description EXPECTORATED SPUTUM  Final   Special Requests NONE  Final   Sputum evaluation THIS SPECIMEN IS ACCEPTABLE FOR SPUTUM CULTURE  Final   Report Status 03/31/2017 FINAL  Final  Culture, respiratory (NON-Expectorated)     Status: None (Preliminary result)   Collection Time: 03/30/17 11:01 PM  Result Value Ref Range Status   Specimen Description EXPECTORATED SPUTUM  Final   Special Requests NONE Reflexed from [id number]  Final   Gram Stain   Final    ABUNDANT WBC PRESENT,BOTH PMN AND MONONUCLEAR FEW GRAM POSITIVE COCCI FEW GRAM NEGATIVE RODS    Culture PENDING  Incomplete   Report Status PENDING  Incomplete     Labs: BNP (last 3 results) Recent Labs    03/30/17 1335  BNP 25.9   Basic Metabolic Panel: Recent Labs  Lab 03/30/17 1237 03/30/17 2207 03/31/17 0317 04/01/17 0243  NA 129*  --  135 134*  K 4.1  --  4.2 3.3*  CL 96*  --  102 97*  CO2 21*  --  24 28  GLUCOSE 131*  --  133* 105*  BUN 13  --  11 9  CREATININE 0.88 0.88 0.85 0.84  CALCIUM 8.7*  --  8.2* 8.3*   Liver Function Tests: Recent Labs  Lab 03/30/17 1237  AST 27  ALT 21  ALKPHOS 74  BILITOT 0.9  PROT 7.1  ALBUMIN 3.9   No results for input(s): LIPASE, AMYLASE in the last 168 hours. No results for input(s): AMMONIA in the last 168 hours. CBC: Recent Labs  Lab 03/30/17 1237 03/30/17 2207 03/31/17 0317 04/01/17 0243  WBC 10.4 7.9 6.3 6.5  NEUTROABS 8.3  --   --   --   HGB 15.7 13.4 14.3 13.7  HCT 45.8 40.4 43.2 41.6  MCV 93.7 92.4 93.5 93.7  PLT 223 195 199 209   Cardiac Enzymes: No results for input(s): CKTOTAL, CKMB,  CKMBINDEX, TROPONINI in the last 168 hours. BNP: Invalid input(s): POCBNP CBG: No results for input(s): GLUCAP in the last 168 hours. D-Dimer Recent Labs    03/30/17 1353  DDIMER 0.82*   Hgb A1c No results for input(s): HGBA1C in the last 72 hours. Lipid Profile No results for input(s): CHOL, HDL, LDLCALC, TRIG, CHOLHDL, LDLDIRECT in the last 72 hours. Thyroid function studies No results for input(s): TSH, T4TOTAL, T3FREE, THYROIDAB in the last 72 hours.  Invalid input(s): FREET3 Anemia work up No results for input(s): VITAMINB12, FOLATE, FERRITIN, TIBC, IRON, RETICCTPCT in the last 72 hours. Urinalysis  Component Value Date/Time   COLORURINE YELLOW 03/30/2017 1237   APPEARANCEUR HAZY (A) 03/30/2017 1237   LABSPEC 1.029 03/30/2017 1237   PHURINE 5.0 03/30/2017 1237   GLUCOSEU NEGATIVE 03/30/2017 1237   HGBUR SMALL (A) 03/30/2017 1237   BILIRUBINUR NEGATIVE 03/30/2017 1237   KETONESUR 80 (A) 03/30/2017 1237   PROTEINUR 30 (A) 03/30/2017 1237   NITRITE NEGATIVE 03/30/2017 1237   LEUKOCYTESUR NEGATIVE 03/30/2017 1237   Sepsis Labs Invalid input(s): PROCALCITONIN,  WBC,  LACTICIDVEN Microbiology Recent Results (from the past 240 hour(s))  Blood Culture (routine x 2)     Status: None (Preliminary result)   Collection Time: 03/30/17  4:44 PM  Result Value Ref Range Status   Specimen Description BLOOD RIGHT ANTECUBITAL  Final   Special Requests   Final    BOTTLES DRAWN AEROBIC AND ANAEROBIC Blood Culture adequate volume   Culture NO GROWTH < 24 HOURS  Final   Report Status PENDING  Incomplete  Blood Culture (routine x 2)     Status: None (Preliminary result)   Collection Time: 03/30/17  4:49 PM  Result Value Ref Range Status   Specimen Description BLOOD LEFT ANTECUBITAL  Final   Special Requests   Final    BOTTLES DRAWN AEROBIC AND ANAEROBIC Blood Culture adequate volume   Culture NO GROWTH < 24 HOURS  Final   Report Status PENDING  Incomplete  Respiratory Panel by  PCR     Status: Abnormal   Collection Time: 03/30/17  9:30 PM  Result Value Ref Range Status   Adenovirus NOT DETECTED NOT DETECTED Final   Coronavirus 229E NOT DETECTED NOT DETECTED Final   Coronavirus HKU1 NOT DETECTED NOT DETECTED Final   Coronavirus NL63 NOT DETECTED NOT DETECTED Final   Coronavirus OC43 NOT DETECTED NOT DETECTED Final   Metapneumovirus NOT DETECTED NOT DETECTED Final   Rhinovirus / Enterovirus NOT DETECTED NOT DETECTED Final   Influenza A NOT DETECTED NOT DETECTED Final   Influenza B NOT DETECTED NOT DETECTED Final   Parainfluenza Virus 1 NOT DETECTED NOT DETECTED Final   Parainfluenza Virus 2 NOT DETECTED NOT DETECTED Final   Parainfluenza Virus 3 NOT DETECTED NOT DETECTED Final   Parainfluenza Virus 4 NOT DETECTED NOT DETECTED Final   Respiratory Syncytial Virus DETECTED (A) NOT DETECTED Final    Comment: CRITICAL RESULT CALLED TO, READ BACK BY AND VERIFIED WITH: K. COBB,RN [id number] 03/31/2017 T. TYSOR    Bordetella pertussis NOT DETECTED NOT DETECTED Final   Chlamydophila pneumoniae NOT DETECTED NOT DETECTED Final   Mycoplasma pneumoniae NOT DETECTED NOT DETECTED Final  Culture, sputum-assessment     Status: None   Collection Time: 03/30/17 11:01 PM  Result Value Ref Range Status   Specimen Description EXPECTORATED SPUTUM  Final   Special Requests NONE  Final   Sputum evaluation THIS SPECIMEN IS ACCEPTABLE FOR SPUTUM CULTURE  Final   Report Status 03/31/2017 FINAL  Final  Culture, respiratory (NON-Expectorated)     Status: None (Preliminary result)   Collection Time: 03/30/17 11:01 PM  Result Value Ref Range Status   Specimen Description EXPECTORATED SPUTUM  Final   Special Requests NONE Reflexed from [id number]  Final   Gram Stain   Final    ABUNDANT WBC PRESENT,BOTH PMN AND MONONUCLEAR FEW GRAM POSITIVE COCCI FEW GRAM NEGATIVE RODS    Culture PENDING  Incomplete   Report Status PENDING  Incomplete     SIGNED:   Marylu Lund, MD  [facility] 04/01/2017, 8:49 AM  If  7PM-7AM, please contact night-coverage [email or web address] Password [email or web address]

## 2017-04-01 NOTE — Progress Notes (Signed)
SATURATION QUALIFICATIONS: (This note is used to comply with regulatory documentation for home oxygen)  Patient Saturations on Room Air at Rest = 96%  Patient Saturations on Room Air while Ambulating = 96%  Patient Saturations on 2 Liters of oxygen while Ambulating = 97 %  Please briefly explain why patient needs home oxygen:

## 2017-04-02 LAB — CULTURE, RESPIRATORY W GRAM STAIN

## 2017-04-02 LAB — CULTURE, RESPIRATORY: CULTURE: NORMAL

## 2017-04-04 LAB — CULTURE, BLOOD (ROUTINE X 2)
Culture: NO GROWTH
Culture: NO GROWTH
SPECIAL REQUESTS: ADEQUATE
SPECIAL REQUESTS: ADEQUATE

## 2017-04-11 NOTE — Progress Notes (Signed)
Subjective:    Patient ID: Matthew [name], male    DOB: 06/22/1960, 57 y.o.   MRN: [id number]  HPI:  04/11/16 OV: Matthew [name] presents to establish as a new pt.  He has not been working with a PCP, however using Urgent Care for all health care needs.  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 He is smoking "about a pack a day", since high school.    04/12/17 OV: Matthew [name] was seen 04/2016 for acute illness, however never followed up for HTN as directed. He was admitted for hypoxia 03/30/17-04/01/17 Hosp Summary: "57 y.o.malewithhistory of tobacco abuse and hypertension presently not on medications presents to the ER with complaints of worsening shortness of breath. Patient symptoms started 4 days ago with fever and chills. Since then patient has been having wheezing with shortness of breath. Has benign productive cough which is acutely worsened today. Has some chest pain on coughing otherwise chest pain-free. Denies any recent travel or sick contacts. In the ER patient is found to be wheezing and EKG shows sinus tachycardia. CT angiogram of the chest done shows bronchopneumonia with and possible bronchiolitis. Patient is placed on empiric antibiotics and admitted for further management"  He is here for hospital f/u: Reviewed all labs, imaging, course of tx.  He completed 4 days of Azithromycin and Ceftin per hospital instructions. He has been tobacco free for 14 days-GREAT JOB! Overall he is feeling much better each day. He has not taken Eplerenone 50mg  BID in months and has not seen Dr. Dellie Catholic Specialist in months.  Patient Care Team    Relationship Specialty Notifications Start End  Esaw Grandchild, NP PCP - General Family Medicine  04/11/16     Patient Active Problem List   Diagnosis Date Noted  . CAP (community acquired pneumonia) 03/30/2017  . Acute respiratory failure with hypoxia ([place]) 03/30/2017  . Acute bronchitis 03/30/2017  .  Acute otitis media, right 05/05/2016  . Pharyngitis 05/05/2016     Past Medical History:  Diagnosis Date  . CAP (community acquired pneumonia) 03/31/2017  . COPD (chronic obstructive pulmonary disease) ([place])   . Hypertension      Past Surgical History:  Procedure Laterality Date  . HERNIA REPAIR    . HERNIA REPAIR  1966     Family History  Problem Relation Age of Onset  . Lymphoma Father   . Cancer Mother        breast     Social History   Substance and Sexual Activity  Drug Use Yes  . Frequency: 2.0 times per week  . Types: Marijuana     Social History   Substance and Sexual Activity  Alcohol Use Yes  . Alcohol/week: 1.2 - 2.4 oz  . Types: 2 - 4 Standard drinks or equivalent per week   Comment: 7 cans of beer a week.     Social History   Tobacco Use  Smoking Status Current Every Day Smoker  . Packs/day: 1.00  . Types: Cigarettes  Smokeless Tobacco Never Used     Outpatient Encounter Medications as of 04/12/2017  Medication Sig  . albuterol (PROVENTIL HFA;VENTOLIN HFA) 108 (90 Base) MCG/ACT inhaler Inhale 2 puffs into the lungs every 4 (four) hours as needed for wheezing or shortness of breath.  . nicotine (NICODERM CQ) 14 mg/24hr patch Place 1 patch (14 mg total) onto the skin daily.  . [DISCONTINUED] azithromycin (ZITHROMAX Z-PAK) 250 MG tablet 1 tab po  qday for 4 more days  . [DISCONTINUED] Vitamin D, Ergocalciferol, (DRISDOL) 50000 units CAPS capsule Take 1 capsule (50,000 Units total) by mouth every 7 (seven) days. (Patient not taking: Reported on 03/30/2017)   No facility-administered encounter medications on file as of 04/12/2017.     Allergies: Patient has no known allergies.  Body mass index is 28.32 kg/m.  Blood pressure 126/71, pulse (!) 104, temperature (!) 97.4 F (36.3 C), temperature source Oral, height 5' 2.25" (1.581 m), weight 156 lb 1.6 oz (70.8 kg), SpO2 96 %.     Review of Systems  Constitutional: Negative for activity  change, appetite change, chills, diaphoresis, fatigue, fever and unexpected weight change.  Eyes: Positive for discharge, redness and itching. Negative for visual disturbance.  Respiratory: Negative for chest tightness, shortness of breath and wheezing.   Cardiovascular: Negative for chest pain, palpitations and leg swelling.  Gastrointestinal: Negative for constipation, diarrhea and nausea.  Endocrine: Negative for cold intolerance, heat intolerance, polydipsia and polyphagia.  Genitourinary: Negative for difficulty urinating.  Musculoskeletal: Positive for arthralgias and myalgias. Negative for gait problem.  Skin: Negative for color change.  Neurological: Negative for dizziness, speech difficulty and numbness.  Psychiatric/Behavioral: Negative for agitation and behavioral problems.       Objective:   Physical Exam  Constitutional: He is oriented to person, place, and time. He appears well-developed and well-nourished. No distress.  HENT:  Head: Normocephalic and atraumatic.  Right Ear: External ear normal.  Left Ear: External ear normal.  Eyes: Conjunctivae are normal. Pupils are equal, round, and reactive to light.  Neck: Normal range of motion. Neck supple.  Cardiovascular: Normal rate, regular rhythm, normal heart sounds and intact distal pulses.  No murmur heard. Pulmonary/Chest: Effort normal and breath sounds normal. No respiratory distress. He has no wheezes. He has no rales. He exhibits no tenderness.  Abdominal: Soft. Bowel sounds are normal. There is no tenderness.  Musculoskeletal: Normal range of motion.  Neurological: He is alert and oriented to person, place, and time. He has normal reflexes.  Skin: Skin is warm and dry. No rash noted. He is not diaphoretic. No erythema. No pallor.  Psychiatric: He has a normal mood and affect. His behavior is normal. Judgment and thought content normal.  Nursing note and vitals reviewed.     Assessment & Plan:   1. Need for  influenza vaccination   2. Hospital discharge follow-up   3. Acute bronchitis, unspecified organism   4. Essential hypertension   5. Healthcare maintenance     Hypertension BP at goal 126/71, HR 104 He has been off Eplerenone 50mg  BID for months He denies acute cardiac sx's Instructed to check BP at home and if consistently >140/90- call clinic  Hospital discharge follow-up [place]!!! Increase water, strive for at least 60 oz/day. Continue heart healthy diet and regular movement. Please continue to check your blood pressure and if consistently >140/90, then please call clinic. Please make appt with: Dr. Tempie Hoist: [address], [place], [place] [address]  [phone] Please schedule complete physical and fasting labs in the couple months.    FOLLOW-UP:  Return in about 2 months (around 06/10/2017) for Fasting Labs, CPE.

## 2017-04-12 ENCOUNTER — Ambulatory Visit (INDEPENDENT_AMBULATORY_CARE_PROVIDER_SITE_OTHER): Payer: 59 | Admitting: Adult Health

## 2017-04-12 ENCOUNTER — Encounter: Payer: Self-pay | Admitting: Adult Health

## 2017-04-12 VITALS — BP 126/71 | HR 104 | Temp 97.4°F | Ht 62.25 in | Wt 156.1 lb

## 2017-04-12 DIAGNOSIS — Z23 Encounter for immunization: Secondary | ICD-10-CM | POA: Diagnosis not present

## 2017-04-12 DIAGNOSIS — J209 Acute bronchitis, unspecified: Secondary | ICD-10-CM | POA: Diagnosis not present

## 2017-04-12 DIAGNOSIS — Z09 Encounter for follow-up examination after completed treatment for conditions other than malignant neoplasm: Secondary | ICD-10-CM | POA: Diagnosis not present

## 2017-04-12 DIAGNOSIS — Z0000 Encounter for general adult medical examination without abnormal findings: Secondary | ICD-10-CM

## 2017-04-12 DIAGNOSIS — I10 Essential (primary) hypertension: Secondary | ICD-10-CM | POA: Diagnosis not present

## 2017-04-12 MED ORDER — ALBUTEROL SULFATE HFA 108 (90 BASE) MCG/ACT IN AERS: 2.0000 | INHALATION_SPRAY | RESPIRATORY_TRACT | 0 refills | Status: DC | PRN

## 2017-04-12 MED ORDER — NICOTINE 14 MG/24HR TD PT24
14.0000 mg | MEDICATED_PATCH | Freq: Every day | TRANSDERMAL | 0 refills | Status: DC
Start: 2017-04-12 — End: 2017-07-17

## 2017-04-12 NOTE — Patient Instructions (Addendum)
Nicotine skin patches What is this medicine? NICOTINE ([place]) helps people stop smoking. The patches replace the nicotine found in cigarettes and help to decrease withdrawal effects. They are most effective when used in combination with a stop-smoking program. This medicine may be used for other purposes; ask your health care provider or pharmacist if you have questions. COMMON BRAND NAME(S): Habitrol, Nicoderm CQ, Nicotrol What should I tell my health care provider before I take this medicine? They need to know if you have any of these conditions: -diabetes -heart disease, angina, irregular heartbeat or previous heart attack -high blood pressure -lung disease, including asthma -overactive thyroid -pheochromocytoma -seizures or a history of seizures -skin problems, like eczema -stomach problems or ulcers -an unusual or allergic reaction to nicotine, adhesives, other medicines, foods, dyes, or preservatives -pregnant or trying to get pregnant -breast-feeding How should I use this medicine? This medicine is for use on the skin. Follow the directions that come with the patches. Find an area of skin on your upper arm, chest, or back that is clean, dry, greaseless, undamaged and hairless. Wash hands with plain soap and water. Do not use anything that contains aloe, lanolin or glycerin as these may prevent the patch from sticking. Dry thoroughly. Remove the patch from the sealed pouch. Do not try to cut or trim the patch. Using your palm, press the patch firmly in place for 10 seconds to make sure that there is good contact with your skin. After applying the patch, wash your hands. Change the patch every day, keeping to a regular schedule. When you apply a new patch, use a new area of skin. Wait at least 1 week before using the same area again. Talk to your pediatrician regarding the use of this medicine in children. Special care may be needed. Overdosage: If you think you have taken too much  of this medicine contact a poison control center or emergency room at once. NOTE: This medicine is only for you. Do not share this medicine with others. What if I miss a dose? If you forget to replace a patch, use it as soon as you can. Only use one patch at a time and do not leave on the skin for longer than directed. If a patch falls off, you can replace it, but keep to your schedule and remove the patch at the right time. What may interact with this medicine? -medicines for asthma -medicines for blood pressure -medicines for mental depression This list may not describe all possible interactions. Give your health care provider a list of all the medicines, herbs, non-prescription drugs, or dietary supplements you use. Also tell them if you smoke, drink alcohol, or use illegal drugs. Some items may interact with your medicine. What should I watch for while using this medicine? You should begin using the nicotine patch the day you stop smoking. It is okay if you do not succeed at your attempt to quit and have a cigarette. You can still continue your quit attempt and keep using the product as directed. Just throw away your cigarettes and get back to your quit plan. You can keep the patch in place during swimming, bathing, and showering. If your patch falls off during these activities, replace it. When you first apply the patch, your skin may itch or burn. This should go away soon. When you remove a patch, the skin may look red, but this should only last for a few days. Call your doctor or health care professional  if skin redness does not go away after 4 days, if your skin swells, or if you get a rash. If you are a diabetic and you quit smoking, the effects of insulin may be increased and you may need to reduce your insulin dose. Check with your doctor or health care professional about how you should adjust your insulin dose. If you are going to have a magnetic resonance imaging (MRI) procedure, tell your  MRI technician if you have this patch on your body. It must be removed before a MRI. What side effects may I notice from receiving this medicine? Side effects that you should report to your doctor or health care professional as soon as possible: -allergic reactions like skin rash, itching or hives, swelling of the face, lips, or tongue -breathing problems -changes in hearing -changes in vision -chest pain -cold sweats -confusion -fast, irregular heartbeat -feeling faint or lightheaded, falls -headache -increased saliva -skin redness that lasts more than 4 days -stomach pain -signs and symptoms of nicotine overdose like nausea; vomiting; dizziness; weakness; and rapid heartbeat Side effects that usually do not require medical attention (report to your doctor or health care professional if they continue or are bothersome): -diarrhea -dry mouth -hiccups -irritability -nervousness or restlessness -trouble sleeping or vivid dreams This list may not describe all possible side effects. Call your doctor for medical advice about side effects. You may report side effects to FDA at 1-800-FDA-1088. Where should I keep my medicine? Keep out of the reach of children. Store at room temperature between 20 and 25 degrees C (68 and 77 degrees F). Protect from heat and light. Store in International aid/development worker until ready to use. Throw away unused medicine after the expiration date. When you remove a patch, fold with sticky sides together; put in an empty opened pouch and throw away. NOTE: This sheet is a summary. It may not cover all possible information. If you have questions about this medicine, talk to your doctor, pharmacist, or health care provider.  2018 Elsevier/Gold Standard (2014-02-10 15:46:21)  GREAT GREAT GREAT JOB ON QUITTING SMOKING!!! Increase water, strive for at least 60 oz/day. Continue heart healthy diet and regular movement. Please continue to check your blood pressure and if  consistently >140/90, then please call clinic. Please make appt with:  Dr. Tempie Hoist: [address], [place], [place] [address]  [phone]  Please schedule complete physical and fasting labs in the couple months.  NICE TO SEE YOU!

## 2017-04-12 NOTE — Assessment & Plan Note (Signed)
BP at goal 126/71, HR 104 He has been off Eplerenone 50mg  BID for months He denies acute cardiac sx's Instructed to check BP at home and if consistently >140/90- call clinic

## 2017-04-12 NOTE — Assessment & Plan Note (Signed)
GREAT GREAT GREAT JOB ON QUITTING SMOKING!!! Increase water, strive for at least 60 oz/day. Continue heart healthy diet and regular movement. Please continue to check your blood pressure and if consistently >140/90, then please call clinic. Please make appt with: Dr. Tempie Hoist: [address], [place], [place] [address]  [phone] Please schedule complete physical and fasting labs in the couple months.

## 2017-06-13 ENCOUNTER — Other Ambulatory Visit: Payer: Self-pay

## 2017-06-13 DIAGNOSIS — Z0000 Encounter for general adult medical examination without abnormal findings: Secondary | ICD-10-CM

## 2017-06-13 DIAGNOSIS — E559 Vitamin D deficiency, unspecified: Secondary | ICD-10-CM

## 2017-06-13 NOTE — Progress Notes (Unsigned)
Subjective:    Patient ID: Matthew [name], male    DOB: [birth year], 57 y.o.   MRN: [id number]  HPI:  04/11/16 OV: Matthew [name] presents to establish as a new pt.  He has not been working with a PCP, however using Urgent Care for all health care needs.  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 He is smoking "about a pack a day", since high school.    04/12/17 OV: Matthew [name] was seen 04/2016 for acute illness, however never followed up for HTN as directed. He was admitted for hypoxia 03/30/17-04/01/17 Hosp Summary: "57 y.o.malewithhistory of tobacco abuse and hypertension presently not on medications presents to the ER with complaints of worsening shortness of breath. Patient symptoms started 4 days ago with fever and chills. Since then patient has been having wheezing with shortness of breath. Has benign productive cough which is acutely worsened today. Has some chest pain on coughing otherwise chest pain-free. Denies any recent travel or sick contacts. In the ER patient is found to be wheezing and EKG shows sinus tachycardia. CT angiogram of the chest done shows bronchopneumonia with and possible bronchiolitis. Patient is placed on empiric antibiotics and admitted for further management"  He is here for hospital f/u: Reviewed all labs, imaging, course of tx.  He completed 4 days of Azithromycin and Ceftin per hospital instructions. He has been tobacco free for 14 days-GREAT JOB! Overall he is feeling much better each day. He has not taken Eplerenone 50mg  BID in months and has not seen Dr. Dellie Catholic Specialist in months.  06/14/17 OV: Matthew [name] presents for CPE  Healthcare Maintenance: Colonoscopy- Immunizations-   Patient Care Team    Relationship Specialty Notifications Start End  Esaw Grandchild, NP PCP - General Family Medicine  04/11/16     Patient Active Problem List   Diagnosis Date Noted  . Hospital discharge follow-up 04/12/2017   . Hypertension 04/12/2017  . CAP (community acquired pneumonia) 03/30/2017  . Acute respiratory failure with hypoxia ([place]) 03/30/2017  . Acute bronchitis 03/30/2017  . Acute otitis media, right 05/05/2016  . Pharyngitis 05/05/2016     Past Medical History:  Diagnosis Date  . CAP (community acquired pneumonia) 03/31/2017  . COPD (chronic obstructive pulmonary disease) ([place])   . Hypertension      Past Surgical History:  Procedure Laterality Date  . HERNIA REPAIR    . HERNIA REPAIR  1966     Family History  Problem Relation Age of Onset  . Lymphoma Father   . Cancer Mother        breast     Social History   Substance and Sexual Activity  Drug Use Yes  . Frequency: 2.0 times per week  . Types: Marijuana     Social History   Substance and Sexual Activity  Alcohol Use Yes  . Alcohol/week: 1.2 - 2.4 oz  . Types: 2 - 4 Standard drinks or equivalent per week   Comment: 7 cans of beer a week.     Social History   Tobacco Use  Smoking Status Current Every Day Smoker  . Packs/day: 1.00  . Types: Cigarettes  Smokeless Tobacco Never Used     Outpatient Encounter Medications as of 06/14/2017  Medication Sig  . albuterol (PROVENTIL HFA;VENTOLIN HFA) 108 (90 Base) MCG/ACT inhaler Inhale 2 puffs into the lungs every 4 (four) hours as needed for wheezing or shortness of breath.  . nicotine (NICODERM CQ) 14  mg/24hr patch Place 1 patch (14 mg total) onto the skin daily.   No facility-administered encounter medications on file as of 06/14/2017.     Allergies: Patient has no known allergies.  There is no height or weight on file to calculate BMI.  There were no vitals taken for this visit.     Review of Systems  Constitutional: Negative for activity change, appetite change, chills, diaphoresis, fatigue, fever and unexpected weight change.  Eyes: Positive for discharge, redness and itching. Negative for visual disturbance.  Respiratory: Negative for chest  tightness, shortness of breath and wheezing.   Cardiovascular: Negative for chest pain, palpitations and leg swelling.  Gastrointestinal: Negative for constipation, diarrhea and nausea.  Endocrine: Negative for cold intolerance, heat intolerance, polydipsia and polyphagia.  Genitourinary: Negative for difficulty urinating.  Musculoskeletal: Positive for arthralgias and myalgias. Negative for gait problem.  Skin: Negative for color change.  Neurological: Negative for dizziness, speech difficulty and numbness.  Psychiatric/Behavioral: Negative for agitation and behavioral problems.       Objective:   Physical Exam  Constitutional: He is oriented to person, place, and time. He appears well-developed and well-nourished. No distress.  HENT:  Head: Normocephalic and atraumatic.  Right Ear: External ear normal.  Left Ear: External ear normal.  Eyes: Conjunctivae are normal. Pupils are equal, round, and reactive to light.  Neck: Normal range of motion. Neck supple.  Cardiovascular: Normal rate, regular rhythm, normal heart sounds and intact distal pulses.  No murmur heard. Pulmonary/Chest: Effort normal and breath sounds normal. No respiratory distress. He has no wheezes. He has no rales. He exhibits no tenderness.  Abdominal: Soft. Bowel sounds are normal. There is no tenderness.  Musculoskeletal: Normal range of motion.  Neurological: He is alert and oriented to person, place, and time. He has normal reflexes.  Skin: Skin is warm and dry. No rash noted. He is not diaphoretic. No erythema. No pallor.  Psychiatric: He has a normal mood and affect. His behavior is normal. Judgment and thought content normal.  Nursing note and vitals reviewed.     Assessment & Plan:   No diagnosis found.  No problem-specific Assessment & Plan notes found for this encounter.    FOLLOW-UP:  No Follow-up on file.

## 2017-06-14 ENCOUNTER — Other Ambulatory Visit: Payer: 59

## 2017-06-14 DIAGNOSIS — E559 Vitamin D deficiency, unspecified: Secondary | ICD-10-CM

## 2017-06-14 DIAGNOSIS — Z0000 Encounter for general adult medical examination without abnormal findings: Secondary | ICD-10-CM

## 2017-06-15 ENCOUNTER — Other Ambulatory Visit: Payer: Self-pay | Admitting: Adult Health

## 2017-06-15 DIAGNOSIS — E559 Vitamin D deficiency, unspecified: Secondary | ICD-10-CM

## 2017-06-15 LAB — LIPID PANEL
CHOL/HDL RATIO: 2.9 ratio (ref 0.0–5.0)
Cholesterol, Total: 191 mg/dL (ref 100–199)
HDL: 66 mg/dL (ref >39)
LDL CALC: 111 mg/dL — AB (ref 0–99)
Triglycerides: 69 mg/dL (ref 0–149)
VLDL CHOLESTEROL CAL: 14 mg/dL (ref 5–40)

## 2017-06-15 LAB — HEMOGLOBIN A1C
Est. average glucose Bld gHb Est-mCnc: 120 mg/dL
Hgb A1c MFr Bld: 5.8 % — ABNORMAL HIGH (ref 4.8–5.6)

## 2017-06-15 LAB — VITAMIN D 25 HYDROXY (VIT D DEFICIENCY, FRACTURES): VIT D 25 HYDROXY: 18.4 ng/mL — AB (ref 30.0–100.0)

## 2017-06-15 LAB — TSH: TSH: 1.89 u[IU]/mL (ref 0.450–4.500)

## 2017-06-15 MED ORDER — VITAMIN D (ERGOCALCIFEROL) 1.25 MG (50000 UNIT) PO CAPS: 50000.0000 [IU] | ORAL_CAPSULE | ORAL | 0 refills | Status: DC

## 2017-06-19 NOTE — Progress Notes (Deleted)
Subjective:    Patient ID: Matthew [name], male    DOB: Jan 29, 1961, 57 y.o.   MRN: [id number]  HPI:  04/11/16 OV: Matthew [name] presents to establish as a new pt.  He has not been working with a PCP, however using Urgent Care for all health care needs.  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 He is smoking "about a pack a day", since high school.    04/12/17 OV: Matthew [name] was seen 04/2016 for acute illness, however never followed up for HTN as directed. He was admitted for hypoxia 03/30/17-04/01/17 Hosp Summary: "57 y.o.malewithhistory of tobacco abuse and hypertension presently not on medications presents to the ER with complaints of worsening shortness of breath. Patient symptoms started 4 days ago with fever and chills. Since then patient has been having wheezing with shortness of breath. Has benign productive cough which is acutely worsened today. Has some chest pain on coughing otherwise chest pain-free. Denies any recent travel or sick contacts. In the ER patient is found to be wheezing and EKG shows sinus tachycardia. CT angiogram of the chest done shows bronchopneumonia with and possible bronchiolitis. Patient is placed on empiric antibiotics and admitted for further management"  He is here for hospital f/u: Reviewed all labs, imaging, course of tx.  He completed 4 days of Azithromycin and Ceftin per hospital instructions. He has been tobacco free for 14 days-GREAT JOB! Overall he is feeling much better each day. He has not taken Eplerenone 50mg  BID in months and has not seen Dr. Dellie Catholic Specialist in months.  06/21/17 OV: Matthew [name] presents for CPE.  Healthcare Maintenance: Colonoscopy- Immunizations-   Patient Care Team    Relationship Specialty Notifications Start End  [place], Berna Spare, NP PCP - General Family Medicine  04/11/16     Patient Active Problem List   Diagnosis Date Noted  . Hospital discharge follow-up 04/12/2017   . Hypertension 04/12/2017  . CAP (community acquired pneumonia) 03/30/2017  . Acute respiratory failure with hypoxia ([place]) 03/30/2017  . Acute bronchitis 03/30/2017  . Acute otitis media, right 05/05/2016  . Pharyngitis 05/05/2016     Past Medical History:  Diagnosis Date  . CAP (community acquired pneumonia) 03/31/2017  . COPD (chronic obstructive pulmonary disease) ([place])   . Hypertension      Past Surgical History:  Procedure Laterality Date  . HERNIA REPAIR    . HERNIA REPAIR  1966     Family History  Problem Relation Age of Onset  . Lymphoma Father   . Cancer Mother        breast     Social History   Substance and Sexual Activity  Drug Use Yes  . Frequency: 2.0 times per week  . Types: Marijuana     Social History   Substance and Sexual Activity  Alcohol Use Yes  . Alcohol/week: 1.2 - 2.4 oz  . Types: 2 - 4 Standard drinks or equivalent per week   Comment: 7 cans of beer a week.     Social History   Tobacco Use  Smoking Status Current Every Day Smoker  . Packs/day: 1.00  . Types: Cigarettes  Smokeless Tobacco Never Used     Outpatient Encounter Medications as of 06/21/2017  Medication Sig  . albuterol (PROVENTIL HFA;VENTOLIN HFA) 108 (90 Base) MCG/ACT inhaler Inhale 2 puffs into the lungs every 4 (four) hours as needed for wheezing or shortness of breath.  . nicotine (NICODERM CQ) 14  mg/24hr patch Place 1 patch (14 mg total) onto the skin daily.  . Vitamin D, Ergocalciferol, (DRISDOL) 50000 units CAPS capsule Take 1 capsule (50,000 Units total) by mouth every 7 (seven) days.   No facility-administered encounter medications on file as of 06/21/2017.     Allergies: Patient has no known allergies.  There is no height or weight on file to calculate BMI.  There were no vitals taken for this visit.     Review of Systems  Constitutional: Negative for activity change, appetite change, chills, diaphoresis, fatigue, fever and unexpected  weight change.  Eyes: Positive for discharge, redness and itching. Negative for visual disturbance.  Respiratory: Negative for chest tightness, shortness of breath and wheezing.   Cardiovascular: Negative for chest pain, palpitations and leg swelling.  Gastrointestinal: Negative for constipation, diarrhea and nausea.  Endocrine: Negative for cold intolerance, heat intolerance, polydipsia and polyphagia.  Genitourinary: Negative for difficulty urinating.  Musculoskeletal: Positive for arthralgias and myalgias. Negative for gait problem.  Skin: Negative for color change.  Neurological: Negative for dizziness, speech difficulty and numbness.  Psychiatric/Behavioral: Negative for agitation and behavioral problems.       Objective:   Physical Exam  Constitutional: He is oriented to person, place, and time. He appears well-developed and well-nourished. No distress.  HENT:  Head: Normocephalic and atraumatic.  Right Ear: External ear normal.  Left Ear: External ear normal.  Eyes: Pupils are equal, round, and reactive to light. Conjunctivae are normal.  Neck: Normal range of motion. Neck supple.  Cardiovascular: Normal rate, regular rhythm, normal heart sounds and intact distal pulses.  No murmur heard. Pulmonary/Chest: Effort normal and breath sounds normal. No respiratory distress. He has no wheezes. He has no rales. He exhibits no tenderness.  Abdominal: Soft. Bowel sounds are normal. There is no tenderness.  Musculoskeletal: Normal range of motion.  Neurological: He is alert and oriented to person, place, and time. He has normal reflexes.  Skin: Skin is warm and dry. No rash noted. He is not diaphoretic. No erythema. No pallor.  Psychiatric: He has a normal mood and affect. His behavior is normal. Judgment and thought content normal.  Nursing note and vitals reviewed.     Assessment & Plan:   No diagnosis found.  No problem-specific Assessment & Plan notes found for this  encounter.    FOLLOW-UP:  No follow-ups on file.

## 2017-06-21 ENCOUNTER — Encounter: Payer: 59 | Admitting: Adult Health

## 2017-06-22 NOTE — Progress Notes (Signed)
Pt cancelled CPE.  Please advise.  Charyl Bigger, CMA

## 2017-07-17 ENCOUNTER — Ambulatory Visit (INDEPENDENT_AMBULATORY_CARE_PROVIDER_SITE_OTHER): Payer: 59 | Admitting: Adult Health

## 2017-07-17 ENCOUNTER — Encounter: Payer: Self-pay | Admitting: Adult Health

## 2017-07-17 VITALS — BP 164/91 | HR 80 | Temp 98.0°F | Ht 62.25 in | Wt 168.5 lb

## 2017-07-17 DIAGNOSIS — I10 Essential (primary) hypertension: Secondary | ICD-10-CM | POA: Diagnosis not present

## 2017-07-17 DIAGNOSIS — H6692 Otitis media, unspecified, left ear: Secondary | ICD-10-CM | POA: Diagnosis not present

## 2017-07-17 HISTORY — DX: Otitis media, unspecified, left ear: H66.92

## 2017-07-17 MED ORDER — AMOXICILLIN-POT CLAVULANATE 875-125 MG PO TABS: 1.0000 | ORAL_TABLET | Freq: Two times a day (BID) | ORAL | 0 refills | Status: DC

## 2017-07-17 MED ORDER — LISINOPRIL 5 MG PO TABS: 5.0000 mg | ORAL_TABLET | Freq: Every day | ORAL | 0 refills | Status: DC

## 2017-07-17 NOTE — Patient Instructions (Addendum)
Otitis Media, Adult Otitis media occurs when there is inflammation and fluid in the middle ear. Your middle ear is a part of the ear that contains bones for hearing as well as air that helps send sounds to your brain. What are the causes? This condition is caused by a blockage in the eustachian tube. This tube drains fluid from the ear to the back of the nose (nasopharynx). A blockage in this tube can be caused by an object or by swelling (edema) in the tube. Problems that can cause a blockage include:  A cold or other upper respiratory infection.  Allergies.  An irritant, such as tobacco smoke.  Enlarged adenoids. The adenoids are areas of soft tissue located high in the back of the throat, behind the nose and the roof of the mouth.  A mass in the nasopharynx.  Damage to the ear caused by pressure changes (barotrauma).  What are the signs or symptoms? Symptoms of this condition include:  Ear pain.  A fever.  Decreased hearing.  A headache.  Tiredness (lethargy).  Fluid leaking from the ear.  Ringing in the ear.  How is this diagnosed? This condition is diagnosed with a physical exam. During the exam your health care provider will use an instrument called an otoscope to look into your ear and check for redness, swelling, and fluid. He or she will also ask about your symptoms. Your health care provider may also order tests, such as:  A test to check the movement of the eardrum (pneumatic otoscopy). This test is done by squeezing a small amount of air into the ear.  A test that changes air pressure in the middle ear to check how well the eardrum moves and whether the eustachian tube is working (tympanogram).  How is this treated? This condition usually goes away on its own within 3-5 days. But if the condition is caused by a bacteria infection and does not go away own its own, or keeps coming back, your health care provider may:  Prescribe antibiotic medicines to treat the  infection.  Prescribe or recommend medicines to control pain.  Follow these instructions at home:  Take over-the-counter and prescription medicines only as told by your health care provider.  If you were prescribed an antibiotic medicine, take it as told by your health care provider. Do not stop taking the antibiotic even if you start to feel better.  Keep all follow-up visits as told by your health care provider. This is important. Contact a health care provider if:  You have bleeding from your nose.  There is a lump on your neck.  You are not getting better in 5 days.  You feel worse instead of better. Get help right away if:  You have severe pain that is not controlled with medicine.  You have swelling, redness, or pain around your ear.  You have stiffness in your neck.  A part of your face is paralyzed.  The bone behind your ear (mastoid) is tender when you touch it.  You develop a severe headache. Summary  Otitis media is redness, soreness, and swelling of the middle ear.  This condition usually goes away on its own within 3-5 days.  If the problem does not go away in 3-5 days, your health care provider may prescribe or recommend medicines to treat your symptoms.  If you were prescribed an antibiotic medicine, take it as told by your health care provider. This information is not intended to replace advice given  to you by your health care provider. Make sure you discuss any questions you have with your health care provider. Document Released: 12/18/2003 Document Revised: 03/04/2016 Document Reviewed: 03/04/2016 Elsevier Interactive Patient Education  2018 [employer].  Please take Augmentin as directed. Please use OTC Acetaminophen as needed for pain. Increase water intake and continue to avoid tobacco. GREATTTTT JOB on quitting tobacco use! If symptoms persist after Augmentin completed, then please call clinic. Both BP checks above goal Started on Lisinopril  5mg  once daily. Instructed to take one tab daily . Check blood pressure and heart rate daily and bring log to follow-up in one month. FEEL BETTER!

## 2017-07-17 NOTE — Progress Notes (Addendum)
Subjective:    Patient ID: Matthew [name], male    DOB: 1960-06-05, 57 y.o.   MRN: [id number]  HPI:  Matthew [name] presents with L ear pain (constant throb rated 7/10) that started > 1.5 weeks ago and has been steadily worsening.  He also reports clear nasal drainage and L sided facial pressure.  He has been using OTC decongestants that have not treated sx's and caused his BP to rise.  Advised to avoid decongestants and use OTC remedies that will not increase BP, ie Coricidin  He denies CP/dyspnea/palpiations/HA/fever/night sweats/chills/N/V/D He has been checking his BP at home and SBP readings consistently >160  Patient Care Team    Relationship Specialty Notifications Start End  Esaw Grandchild, NP PCP - General Family Medicine  04/11/16     Patient Active Problem List   Diagnosis Date Noted  . Left otitis media 07/17/2017  . Hospital discharge follow-up 04/12/2017  . Hypertension 04/12/2017  . CAP (community acquired pneumonia) 03/30/2017  . Acute respiratory failure with hypoxia ([place]) 03/30/2017  . Acute bronchitis 03/30/2017  . Acute otitis media, right 05/05/2016  . Pharyngitis 05/05/2016     Past Medical History:  Diagnosis Date  . CAP (community acquired pneumonia) 03/31/2017  . COPD (chronic obstructive pulmonary disease) ([place])   . Hypertension      Past Surgical History:  Procedure Laterality Date  . HERNIA REPAIR    . HERNIA REPAIR  1966     Family History  Problem Relation Age of Onset  . Lymphoma Father   . Cancer Mother        breast     Social History   Substance and Sexual Activity  Drug Use Yes  . Frequency: 2.0 times per week  . Types: Marijuana     Social History   Substance and Sexual Activity  Alcohol Use Yes  . Alcohol/week: 1.2 - 2.4 oz  . Types: 2 - 4 Standard drinks or equivalent per week   Comment: 7 cans of beer a week.     Social History   Tobacco Use  Smoking Status Former Smoker  . Packs/day: 1.00  . Types:  Cigarettes  . Last attempt to quit: 03/30/2017  . Years since quitting: 0.2  Smokeless Tobacco Never Used     Outpatient Encounter Medications as of 07/17/2017  Medication Sig  . albuterol (PROVENTIL HFA;VENTOLIN HFA) 108 (90 Base) MCG/ACT inhaler Inhale 2 puffs into the lungs every 4 (four) hours as needed for wheezing or shortness of breath.  . Vitamin D, Ergocalciferol, (DRISDOL) 50000 units CAPS capsule Take 1 capsule (50,000 Units total) by mouth every 7 (seven) days.  Marland Kitchen amoxicillin-clavulanate (AUGMENTIN) 875-125 MG tablet Take 1 tablet by mouth 2 (two) times daily.  Marland Kitchen lisinopril (PRINIVIL,ZESTRIL) 5 MG tablet Take 1 tablet (5 mg total) by mouth daily.  . [DISCONTINUED] nicotine (NICODERM CQ) 14 mg/24hr patch Place 1 patch (14 mg total) onto the skin daily.   No facility-administered encounter medications on file as of 07/17/2017.     Allergies: Patient has no known allergies.  Body mass index is 30.57 kg/m.  Blood pressure (!) 164/91, pulse 80, temperature 98 F (36.7 C), temperature source Oral, height 5' 2.25" (1.581 m), weight 168 lb 8 oz (76.4 kg).   Review of Systems  Constitutional: Positive for fatigue. Negative for activity change, appetite change, chills, diaphoresis, fever and unexpected weight change.  HENT: Positive for congestion, ear pain, postnasal drip, rhinorrhea and sinus pressure. Negative for ear  discharge, sinus pain and sore throat.   Eyes: Negative for visual disturbance.  Respiratory: Negative for cough, chest tightness, shortness of breath, wheezing and stridor.   Cardiovascular: Negative for chest pain, palpitations and leg swelling.  Gastrointestinal: Negative for abdominal distention, anal bleeding, blood in stool, constipation, diarrhea, nausea and vomiting.  Neurological: Negative for dizziness and headaches.  Psychiatric/Behavioral: Negative for confusion, decreased concentration, dysphoric mood, hallucinations, self-injury, sleep disturbance and  suicidal ideas. The patient is not nervous/anxious and is not hyperactive.        Objective:   Physical Exam  Constitutional: He is oriented to person, place, and time. He appears well-developed and well-nourished. No distress.  HENT:  Head: Normocephalic and atraumatic.  Right Ear: External ear normal. Tympanic membrane is perforated and bulging. Tympanic membrane is not erythematous. No decreased hearing is noted.  Left Ear: External ear normal. Tympanic membrane is erythematous and bulging. No decreased hearing is noted.  Nose: Mucosal edema and rhinorrhea present. Right sinus exhibits no maxillary sinus tenderness and no frontal sinus tenderness. Left sinus exhibits maxillary sinus tenderness and frontal sinus tenderness.  Mouth/Throat: Uvula is midline and mucous membranes are normal. Posterior oropharyngeal edema and posterior oropharyngeal erythema present. No oropharyngeal exudate or tonsillar abscesses.  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 Conjunctivae are normal.  Neck: Normal range of motion. Neck supple.  Cardiovascular: Normal rate, regular rhythm, normal heart sounds and intact distal pulses.  No murmur heard. Pulmonary/Chest: Effort normal and breath sounds normal. No respiratory distress. He has no wheezes. He has no rales. He exhibits no tenderness.  Lymphadenopathy:    He has no cervical adenopathy.  Neurological: He is alert and oriented to person, place, and time.  Skin: Skin is warm and dry. No rash noted. He is not diaphoretic. No erythema. No pallor.  Psychiatric: He has a normal mood and affect. His behavior is normal. Judgment and thought content normal.  Nursing note and vitals reviewed.     Assessment & Plan:   1. Left otitis media, unspecified otitis media type   2. Hypertension, unspecified type     Left otitis media Please take Augmentin as directed. Please use OTC Acetaminophen as needed for  pain. Increase water intake and continue to avoid tobacco. GREATTTTT JOB on quitting tobacco use! If symptoms persist after Augmentin completed, then please call clinic  Hypertension Both BP checks above goal Started on Lisinopril 5mg  QD Instructed to take one tab daily  Check BP/HR daily and bring log to follow-up in one month Creat/GFR both normal when checked in Jan 2019  FOLLOW-UP:  Return in about 1 month (around 08/14/2017) for HTN.

## 2017-07-17 NOTE — Assessment & Plan Note (Signed)
Both BP checks above goal Started on Lisinopril 5mg  QD Instructed to take one tab daily  Check BP/HR daily and bring log to follow-up in one month Creat/GFR both normal when checked in Jan 2019

## 2017-07-17 NOTE — Assessment & Plan Note (Signed)
Please take Augmentin as directed. Please use OTC Acetaminophen as needed for pain. Increase water intake and continue to avoid tobacco. GREATTTTT JOB on quitting tobacco use! If symptoms persist after Augmentin completed, then please call clinic

## 2017-07-25 ENCOUNTER — Encounter: Payer: Self-pay | Admitting: Adult Health

## 2017-07-25 ENCOUNTER — Ambulatory Visit (INDEPENDENT_AMBULATORY_CARE_PROVIDER_SITE_OTHER): Payer: 59 | Admitting: Adult Health

## 2017-07-25 VITALS — BP 138/75 | HR 85 | Temp 98.7°F | Ht 62.25 in | Wt 170.4 lb

## 2017-07-25 DIAGNOSIS — J0100 Acute maxillary sinusitis, unspecified: Secondary | ICD-10-CM | POA: Diagnosis not present

## 2017-07-25 DIAGNOSIS — H6692 Otitis media, unspecified, left ear: Secondary | ICD-10-CM | POA: Diagnosis not present

## 2017-07-25 HISTORY — DX: Acute maxillary sinusitis, unspecified: J01.00

## 2017-07-25 MED ORDER — FLUTICASONE PROPIONATE 50 MCG/ACT NA SUSP: 2.0000 | Freq: Every day | NASAL | 3 refills | Status: DC

## 2017-07-25 MED ORDER — DOXYCYCLINE HYCLATE 100 MG PO TABS: 100.0000 mg | ORAL_TABLET | Freq: Two times a day (BID) | ORAL | 0 refills | Status: DC

## 2017-07-25 NOTE — Progress Notes (Addendum)
Subjective:    Patient ID: Matthew [name], male    DOB: Dec 03, 1960, 57 y.o.   MRN: [id number]  HPI 07/17/17 OV: Matthew [name] presents with L ear pain (constant throb rated 7/10) that started > 1.5 weeks ago and has been steadily worsening.  He also reports clear nasal drainage and L sided facial pressure.  He has been using OTC decongestants that have not treated sx's and caused his BP to rise.  Advised to avoid decongestants and use OTC remedies that will not increase BP, ie Coricidin  He denies CP/dyspnea/palpiations/HA/fever/night sweats/chills/N/V/D He has been checking his BP at home and SBP readings consistently >160  07/25/17 OV: Matthew [name] presents with  He has a few days left on  10 day course of Augmentin and he is continuing to experience copious clear nasal drainage, bil ear pressure, and L ear pain (3/10). He denies cough/fever/night sweats/N/V/D  Patient Care Team    Relationship Specialty Notifications Start End  Esaw Grandchild, NP PCP - General Family Medicine  04/11/16     Patient Active Problem List   Diagnosis Date Noted  . Acute maxillary sinusitis 07/25/2017  . Left otitis media 07/17/2017  . Hospital discharge follow-up 04/12/2017  . Hypertension 04/12/2017  . CAP (community acquired pneumonia) 03/30/2017  . Acute respiratory failure with hypoxia ([place]) 03/30/2017  . Acute bronchitis 03/30/2017  . Acute otitis media, right 05/05/2016  . Pharyngitis 05/05/2016     Past Medical History:  Diagnosis Date  . CAP (community acquired pneumonia) 03/31/2017  . COPD (chronic obstructive pulmonary disease) ([place])   . Hypertension      Past Surgical History:  Procedure Laterality Date  . HERNIA REPAIR    . HERNIA REPAIR  1966     Family History  Problem Relation Age of Onset  . Lymphoma Father   . Cancer Mother        breast     Social History   Substance and Sexual Activity  Drug Use Yes  . Frequency: 2.0 times per week  . Types: Marijuana      Social History   Substance and Sexual Activity  Alcohol Use Yes  . Alcohol/week: 1.2 - 2.4 oz  . Types: 2 - 4 Standard drinks or equivalent per week   Comment: 7 cans of beer a week.     Social History   Tobacco Use  Smoking Status Former Smoker  . Packs/day: 1.00  . Types: Cigarettes  . Last attempt to quit: 03/30/2017  . Years since quitting: 0.3  Smokeless Tobacco Never Used     Outpatient Encounter Medications as of 07/25/2017  Medication Sig  . albuterol (PROVENTIL HFA;VENTOLIN HFA) 108 (90 Base) MCG/ACT inhaler Inhale 2 puffs into the lungs every 4 (four) hours as needed for wheezing or shortness of breath.  . lisinopril (PRINIVIL,ZESTRIL) 5 MG tablet Take 1 tablet (5 mg total) by mouth daily.  . Vitamin D, Ergocalciferol, (DRISDOL) 50000 units CAPS capsule Take 1 capsule (50,000 Units total) by mouth every 7 (seven) days.  . [DISCONTINUED] amoxicillin-clavulanate (AUGMENTIN) 875-125 MG tablet Take 1 tablet by mouth 2 (two) times daily.  Marland Kitchen doxycycline (VIBRA-TABS) 100 MG tablet Take 1 tablet (100 mg total) by mouth 2 (two) times daily.  . fluticasone (FLONASE) 50 MCG/ACT nasal spray Place 2 sprays into both nostrils daily.   No facility-administered encounter medications on file as of 07/25/2017.     Allergies: Patient has no known allergies.  Body mass index is 30.92 kg/m.  Blood pressure 138/75, pulse 85, temperature 98.7 F (37.1 C), temperature source Oral, height 5' 2.25" (1.581 m), weight 170 lb 6.4 oz (77.3 kg), SpO2 96 %.   Review of Systems  Constitutional: Positive for fatigue. Negative for activity change, appetite change, chills, diaphoresis, fever and unexpected weight change.  HENT: Positive for congestion, ear pain, postnasal drip, rhinorrhea and sinus pressure. Negative for ear discharge, sinus pain and sore throat.   Eyes: Negative for visual disturbance.  Respiratory: Negative for cough, chest tightness, shortness of breath, wheezing and  stridor.   Cardiovascular: Negative for chest pain, palpitations and leg swelling.  Gastrointestinal: Negative for abdominal distention, anal bleeding, blood in stool, constipation, diarrhea, nausea and vomiting.  Neurological: Negative for dizziness and headaches.  Psychiatric/Behavioral: Negative for confusion, decreased concentration, dysphoric mood, hallucinations, self-injury, sleep disturbance and suicidal ideas. The patient is not nervous/anxious and is not hyperactive.        Objective:   Physical Exam  Constitutional: He is oriented to person, place, and time. He appears well-developed and well-nourished. No distress.  HENT:  Head: Normocephalic and atraumatic.  Right Ear: External ear normal. Tympanic membrane is perforated and bulging. Tympanic membrane is not erythematous. No decreased hearing is noted.  Left Ear: External ear normal. Tympanic membrane is erythematous and bulging. No decreased hearing is noted.  Nose: Mucosal edema and rhinorrhea present. Right sinus exhibits no maxillary sinus tenderness and no frontal sinus tenderness. Left sinus exhibits maxillary sinus tenderness and frontal sinus tenderness.  Mouth/Throat: Uvula is midline and mucous membranes are normal. Posterior oropharyngeal edema and posterior oropharyngeal erythema present. No oropharyngeal exudate or tonsillar abscesses.  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 Conjunctivae are normal.  Neck: Normal range of motion. Neck supple.  Cardiovascular: Normal rate, regular rhythm, normal heart sounds and intact distal pulses.  No murmur heard. Pulmonary/Chest: Effort normal and breath sounds normal. No respiratory distress. He has no wheezes. He has no rales. He exhibits no tenderness.  Lymphadenopathy:    He has no cervical adenopathy.  Neurological: He is alert and oriented to person, place, and time.  Skin: Skin is warm and dry. No rash noted. He is  not diaphoretic. No erythema. No pallor.  Psychiatric: He has a normal mood and affect. His behavior is normal. Judgment and thought content normal.  Nursing note and vitals reviewed.     Assessment & Plan:   1. Left otitis media, unspecified otitis media type   2. Acute maxillary sinusitis, recurrence not specified     Acute maxillary sinusitis Stop Augmentin. Start Doxycycline 100mg  twice daily for 10 days. Please use Flonase and OTC Claritin as directed. Continue to increase fluids. If symptoms persist after antibiotic completed, please call clinic.  Left otitis media Stop Augmentin. Start Doxycycline 100mg  twice daily for 10 days. Please use Flonase and OTC Claritin as directed. Continue to increase fluids. If symptoms persist after antibiotic completed, please call clinic.  FOLLOW-UP:  Return if symptoms worsen or fail to improve.

## 2017-07-25 NOTE — Assessment & Plan Note (Signed)
Stop Augmentin. Start Doxycycline 100mg  twice daily for 10 days. Please use Flonase and OTC Claritin as directed. Continue to increase fluids. If symptoms persist after antibiotic completed, please call clinic.

## 2017-07-25 NOTE — Patient Instructions (Signed)
Otitis Media, Adult Otitis media occurs when there is inflammation and fluid in the middle ear. Your middle ear is a part of the ear that contains bones for hearing as well as air that helps send sounds to your brain. What are the causes? This condition is caused by a blockage in the eustachian tube. This tube drains fluid from the ear to the back of the nose (nasopharynx). A blockage in this tube can be caused by an object or by swelling (edema) in the tube. Problems that can cause a blockage include:  A cold or other upper respiratory infection.  Allergies.  An irritant, such as tobacco smoke.  Enlarged adenoids. The adenoids are areas of soft tissue located high in the back of the throat, behind the nose and the roof of the mouth.  A mass in the nasopharynx.  Damage to the ear caused by pressure changes (barotrauma).  What are the signs or symptoms? Symptoms of this condition include:  Ear pain.  A fever.  Decreased hearing.  A headache.  Tiredness (lethargy).  Fluid leaking from the ear.  Ringing in the ear.  How is this diagnosed? This condition is diagnosed with a physical exam. During the exam your health care provider will use an instrument called an otoscope to look into your ear and check for redness, swelling, and fluid. He or she will also ask about your symptoms. Your health care provider may also order tests, such as:  A test to check the movement of the eardrum (pneumatic otoscopy). This test is done by squeezing a small amount of air into the ear.  A test that changes air pressure in the middle ear to check how well the eardrum moves and whether the eustachian tube is working (tympanogram).  How is this treated? This condition usually goes away on its own within 3-5 days. But if the condition is caused by a bacteria infection and does not go away own its own, or keeps coming back, your health care provider may:  Prescribe antibiotic medicines to treat the  infection.  Prescribe or recommend medicines to control pain.  Follow these instructions at home:  Take over-the-counter and prescription medicines only as told by your health care provider.  If you were prescribed an antibiotic medicine, take it as told by your health care provider. Do not stop taking the antibiotic even if you start to feel better.  Keep all follow-up visits as told by your health care provider. This is important. Contact a health care provider if:  You have bleeding from your nose.  There is a lump on your neck.  You are not getting better in 5 days.  You feel worse instead of better. Get help right away if:  You have severe pain that is not controlled with medicine.  You have swelling, redness, or pain around your ear.  You have stiffness in your neck.  A part of your face is paralyzed.  The bone behind your ear (mastoid) is tender when you touch it.  You develop a severe headache. Summary  Otitis media is redness, soreness, and swelling of the middle ear.  This condition usually goes away on its own within 3-5 days.  If the problem does not go away in 3-5 days, your health care provider may prescribe or recommend medicines to treat your symptoms.  If you were prescribed an antibiotic medicine, take it as told by your health care provider. This information is not intended to replace advice given  to you by your health care provider. Make sure you discuss any questions you have with your health care provider. Document Released: 12/18/2003 Document Revised: 03/04/2016 Document Reviewed: 03/04/2016 Elsevier Interactive Patient Education  2018 [employer].   Sinusitis, Adult Sinusitis is soreness and inflammation of your sinuses. Sinuses are hollow spaces in the bones around your face. Your sinuses are located:  Around your eyes.  In the middle of your forehead.  Behind your nose.  In your cheekbones.  Your sinuses and nasal passages are  lined with a stringy fluid (mucus). Mucus normally drains out of your sinuses. When your nasal tissues become inflamed or swollen, the mucus can become trapped or blocked so air cannot flow through your sinuses. This allows bacteria, viruses, and funguses to grow, which leads to infection. Sinusitis can develop quickly and last for 7?10 days (acute) or for more than 12 weeks (chronic). Sinusitis often develops after a cold. What are the causes? This condition is caused by anything that creates swelling in the sinuses or stops mucus from draining, including:  Allergies.  Asthma.  Bacterial or viral infection.  Abnormally shaped bones between the nasal passages.  Nasal growths that contain mucus (nasal polyps).  Narrow sinus openings.  Pollutants, such as chemicals or irritants in the air.  A foreign object stuck in the nose.  A fungal infection. This is rare.  What increases the risk? The following factors may make you more likely to develop this condition:  Having allergies or asthma.  Having had a recent cold or respiratory tract infection.  Having structural deformities or blockages in your nose or sinuses.  Having a weak immune system.  Doing a lot of swimming or diving.  Overusing nasal sprays.  Smoking.  What are the signs or symptoms? The main symptoms of this condition are pain and a feeling of pressure around the affected sinuses. Other symptoms include:  Upper toothache.  Earache.  Headache.  Bad breath.  Decreased sense of smell and taste.  A cough that may get worse at night.  Fatigue.  Fever.  Thick drainage from your nose. The drainage is often green and it may contain pus (purulent).  Stuffy nose or congestion.  Postnasal drip. This is when extra mucus collects in the throat or back of the nose.  Swelling and warmth over the affected sinuses.  Sore throat.  Sensitivity to light.  How is this diagnosed? This condition is diagnosed  based on symptoms, a medical history, and a physical exam. To find out if your condition is acute or chronic, your health care provider may:  Look in your nose for signs of nasal polyps.  Tap over the affected sinus to check for signs of infection.  View the inside of your sinuses using an imaging device that has a light attached (endoscope).  If your health care provider suspects that you have chronic sinusitis, you may also:  Be tested for allergies.  Have a sample of mucus taken from your nose (nasal culture) and checked for bacteria.  Have a mucus sample examined to see if your sinusitis is related to an allergy.  If your sinusitis does not respond to treatment and it lasts longer than 8 weeks, you may have an MRI or CT scan to check your sinuses. These scans also help to determine how severe your infection is. In rare cases, a bone biopsy may be done to rule out more serious types of fungal sinus disease. How is this treated? Treatment for  sinusitis depends on the cause and whether your condition is chronic or acute. If a virus is causing your sinusitis, your symptoms will go away on their own within 10 days. You may be given medicines to relieve your symptoms, including:  Topical nasal decongestants. They shrink swollen nasal passages and let mucus drain from your sinuses.  Antihistamines. These drugs block inflammation that is triggered by allergies. This can help to ease swelling in your nose and sinuses.  Topical nasal corticosteroids. These are nasal sprays that ease inflammation and swelling in your nose and sinuses.  Nasal saline washes. These rinses can help to get rid of thick mucus in your nose.  If your condition is caused by bacteria, you will be given an antibiotic medicine. If your condition is caused by a fungus, you will be given an antifungal medicine. Surgery may be needed to correct underlying conditions, such as narrow nasal passages. Surgery may also be needed  to remove polyps. Follow these instructions at home: Medicines  Take, use, or apply over-the-counter and prescription medicines only as told by your health care provider. These may include nasal sprays.  If you were prescribed an antibiotic medicine, take it as told by your health care provider. Do not stop taking the antibiotic even if you start to feel better. Hydrate and Humidify  Drink enough water to keep your urine clear or pale yellow. Staying hydrated will help to thin your mucus.  Use a cool mist humidifier to keep the humidity level in your home above 50%.  Inhale steam for 10-15 minutes, 3-4 times a day or as told by your health care provider. You can do this in the bathroom while a hot shower is running.  Limit your exposure to cool or dry air. Rest  Rest as much as possible.  Sleep with your head raised (elevated).  Make sure to get enough sleep each night. General instructions  Apply a warm, moist washcloth to your face 3-4 times a day or as told by your health care provider. This will help with discomfort.  Wash your hands often with soap and water to reduce your exposure to viruses and other germs. If soap and water are not available, use hand sanitizer.  Do not smoke. Avoid being around people who are smoking (secondhand smoke).  Keep all follow-up visits as told by your health care provider. This is important. Contact a health care provider if:  You have a fever.  Your symptoms get worse.  Your symptoms do not improve within 10 days. Get help right away if:  You have a severe headache.  You have persistent vomiting.  You have pain or swelling around your face or eyes.  You have vision problems.  You develop confusion.  Your neck is stiff.  You have trouble breathing. This information is not intended to replace advice given to you by your health care provider. Make sure you discuss any questions you have with your health care provider. Document  Released: 03/14/2005 Document Revised: 11/08/2015 Document Reviewed: 01/07/2015 Elsevier Interactive Patient Education  2018 [employer].  Stop Augmentin. Start Doxycycline 100mg  twice daily for 10 days. Please use Flonase and OTC Claritin as directed. Continue to increase fluids. If symptoms persist after antibiotic completed, please call clinic. FEEL BETTER!

## 2017-08-10 ENCOUNTER — Ambulatory Visit (INDEPENDENT_AMBULATORY_CARE_PROVIDER_SITE_OTHER): Payer: 59 | Admitting: Adult Health

## 2017-08-10 ENCOUNTER — Encounter: Payer: Self-pay | Admitting: Adult Health

## 2017-08-10 VITALS — BP 134/81 | HR 91 | Temp 98.3°F | Ht 62.25 in | Wt 168.2 lb

## 2017-08-10 DIAGNOSIS — H6122 Impacted cerumen, left ear: Secondary | ICD-10-CM | POA: Diagnosis not present

## 2017-08-10 HISTORY — DX: Impacted cerumen, left ear: H61.22

## 2017-08-10 MED ORDER — OFLOXACIN 0.3 % OT SOLN: 5.0000 [drp] | Freq: Every day | OTIC | 0 refills | Status: DC

## 2017-08-10 NOTE — Progress Notes (Signed)
Subjective:    Patient ID: Matthew [name], male    DOB: 07-19-1960, 57 y.o.   MRN: [id number]  Otalgia   Associated symptoms include rhinorrhea. Pertinent negatives include no coughing, diarrhea, ear discharge, headaches, sore throat or vomiting.  07/17/17 OV: Matthew [name] presents with L ear pain (constant throb rated 7/10) that started > 1.5 weeks ago and has been steadily worsening.  He also reports clear nasal drainage and L sided facial pressure.  He has been using OTC decongestants that have not treated sx's and caused his BP to rise.  Advised to avoid decongestants and use OTC remedies that will not increase BP, ie Coricidin  He denies CP/dyspnea/palpiations/HA/fever/night sweats/chills/N/V/D He has been checking his BP at home and SBP readings consistently >160  07/25/17 OV: Matthew [name] presents with  He has a few days left on  10 day course of Augmentin and he is continuing to experience copious clear nasal drainage, bil ear pressure, and L ear pain (3/10). He denies cough/fever/night sweats/N/V/D  08/10/17 OV: Matthew [name] completed course of Doxycycline, had two sx free days then developed L ear "fullness and itching" two days ago. He denies ear pain/HA/dizziness/change in hearing  He denies nasal drainage/cough/fever. He continues to abstain from tobacco use  Patient Care Team    Relationship Specialty Notifications Start End  Esaw Grandchild, NP PCP - General Family Medicine  04/11/16     Patient Active Problem List   Diagnosis Date Noted  . Ear build-up, left 08/10/2017  . Acute maxillary sinusitis 07/25/2017  . Left otitis media 07/17/2017  . Hospital discharge follow-up 04/12/2017  . Hypertension 04/12/2017  . CAP (community acquired pneumonia) 03/30/2017  . Acute respiratory failure with hypoxia ([place]) 03/30/2017  . Acute bronchitis 03/30/2017  . Acute otitis media, right 05/05/2016  . Pharyngitis 05/05/2016     Past Medical History:  Diagnosis Date  . CAP  (community acquired pneumonia) 03/31/2017  . COPD (chronic obstructive pulmonary disease) ([place])   . Hypertension      Past Surgical History:  Procedure Laterality Date  . HERNIA REPAIR    . HERNIA REPAIR  1966     Family History  Problem Relation Age of Onset  . Lymphoma Father   . Cancer Mother        breast     Social History   Substance and Sexual Activity  Drug Use Yes  . Frequency: 2.0 times per week  . Types: Marijuana     Social History   Substance and Sexual Activity  Alcohol Use Yes  . Alcohol/week: 1.2 - 2.4 oz  . Types: 2 - 4 Standard drinks or equivalent per week   Comment: 7 cans of beer a week.     Social History   Tobacco Use  Smoking Status Former Smoker  . Packs/day: 1.00  . Types: Cigarettes  . Last attempt to quit: 03/30/2017  . Years since quitting: 0.3  Smokeless Tobacco Never Used     Outpatient Encounter Medications as of 08/10/2017  Medication Sig  . albuterol (PROVENTIL HFA;VENTOLIN HFA) 108 (90 Base) MCG/ACT inhaler Inhale 2 puffs into the lungs every 4 (four) hours as needed for wheezing or shortness of breath.  . fluticasone (FLONASE) 50 MCG/ACT nasal spray Place 2 sprays into both nostrils daily.  Marland Kitchen lisinopril (PRINIVIL,ZESTRIL) 5 MG tablet Take 1 tablet (5 mg total) by mouth daily.  . Vitamin D, Ergocalciferol, (DRISDOL) 50000 units CAPS capsule Take 1 capsule (50,000 Units total) by mouth  every 7 (seven) days.  Marland Kitchen ofloxacin (FLOXIN) 0.3 % OTIC solution Place 5 drops into the left ear daily.  . [DISCONTINUED] doxycycline (VIBRA-TABS) 100 MG tablet Take 1 tablet (100 mg total) by mouth 2 (two) times daily.   No facility-administered encounter medications on file as of 08/10/2017.     Allergies: Patient has no known allergies.  Body mass index is 30.52 kg/m.  Blood pressure 134/81, pulse 91, temperature 98.3 F (36.8 C), temperature source Oral, height 5' 2.25" (1.581 m), weight 168 lb 3.2 oz (76.3 kg), SpO2 97  %.   Review of Systems  Constitutional: Positive for fatigue. Negative for activity change, appetite change, chills, diaphoresis, fever and unexpected weight change.  HENT: Positive for congestion, ear pain, postnasal drip, rhinorrhea and sinus pressure. Negative for ear discharge, sinus pain and sore throat.   Eyes: Negative for visual disturbance.  Respiratory: Negative for cough, chest tightness, shortness of breath, wheezing and stridor.   Cardiovascular: Negative for chest pain, palpitations and leg swelling.  Gastrointestinal: Negative for abdominal distention, anal bleeding, blood in stool, constipation, diarrhea, nausea and vomiting.  Neurological: Negative for dizziness and headaches.  Psychiatric/Behavioral: Negative for confusion, decreased concentration, dysphoric mood, hallucinations, self-injury, sleep disturbance and suicidal ideas. The patient is not nervous/anxious and is not hyperactive.        Objective:   Physical Exam  Constitutional: He is oriented to person, place, and time. He appears well-developed and well-nourished. No distress.  HENT:  Head: Normocephalic and atraumatic.  Right Ear: External ear normal. Tympanic membrane is perforated. Tympanic membrane is not erythematous and not bulging. No decreased hearing is noted.  Left Ear: External ear normal. Tympanic membrane is erythematous and bulging. Tympanic membrane is not perforated. No decreased hearing is noted.  Nose: Mucosal edema and rhinorrhea present. Right sinus exhibits no maxillary sinus tenderness and no frontal sinus tenderness. Left sinus exhibits maxillary sinus tenderness and frontal sinus tenderness.  Mouth/Throat: Uvula is midline and mucous membranes are normal. Posterior oropharyngeal edema and posterior oropharyngeal erythema present. No oropharyngeal exudate or tonsillar abscesses.  Well healed old perforation of R ear TM Sig cerumen noted in L ear  Eyes: Pupils are equal, round, and reactive  to light. Conjunctivae are normal.  Neck: Normal range of motion. Neck supple.  Cardiovascular: Normal rate, regular rhythm, normal heart sounds and intact distal pulses.  No murmur heard. Pulmonary/Chest: Effort normal and breath sounds normal. No respiratory distress. He has no wheezes. He has no rales. He exhibits no tenderness.  Lymphadenopathy:    He has no cervical adenopathy.  Neurological: He is alert and oriented to person, place, and time.  Skin: Skin is warm and dry. No rash noted. He is not diaphoretic. No erythema. No pallor.  Psychiatric: He has a normal mood and affect. His behavior is normal. Judgment and thought content normal.  Nursing note and vitals reviewed.     Assessment & Plan:   1. Ear build-up, left     Ear build-up, left Sig removal of cerumen from L ear with flush. TM slightly reddened, however not bugling. No other signs of sinusitis or systemic infection. Please continue with daily OTC Claritin and use Ofloxacin 0.3% OTIC Solution as directed. Increase water intake. If symptoms persist/worsen into next week, please call clinic.  FOLLOW-UP:  Return if symptoms worsen or fail to improve.

## 2017-08-10 NOTE — Assessment & Plan Note (Signed)
Sig removal of cerumen from L ear with flush. TM slightly reddened, however not bugling. No other signs of sinusitis or systemic infection. Please continue with daily OTC Claritin and use Ofloxacin 0.3% OTIC Solution as directed. Increase water intake. If symptoms persist/worsen into next week, please call clinic.

## 2017-08-10 NOTE — Patient Instructions (Addendum)
Earwax Buildup, Adult The ears produce a substance called earwax that helps keep bacteria out of the ear and protects the skin in the ear canal. Occasionally, earwax can build up in the ear and cause discomfort or hearing loss. What increases the risk? This condition is more likely to develop in people who:  Are male.  Are elderly.  Naturally produce more earwax.  Clean their ears often with cotton swabs.  Use earplugs often.  Use in-ear headphones often.  Wear hearing aids.  Have narrow ear canals.  Have earwax that is overly thick or sticky.  Have eczema.  Are dehydrated.  Have excess hair in the ear canal.  What are the signs or symptoms? Symptoms of this condition include:  Reduced or muffled hearing.  A feeling of fullness in the ear or feeling that the ear is plugged.  Fluid coming from the ear.  Ear pain.  Ear itch.  Ringing in the ear.  Coughing.  An obvious piece of earwax that can be seen inside the ear canal.  How is this diagnosed? This condition may be diagnosed based on:  Your symptoms.  Your medical history.  An ear exam. During the exam, your health care provider will look into your ear with an instrument called an otoscope.  You may have tests, including a hearing test. How is this treated? This condition may be treated by:  Using ear drops to soften the earwax.  Having the earwax removed by a health care provider. The health care provider may: ? Flush the ear with water. ? Use an instrument that has a loop on the end (curette). ? Use a suction device.  Surgery to remove the wax buildup. This may be done in severe cases.  Follow these instructions at home:  Take over-the-counter and prescription medicines only as told by your health care provider.  Do not put any objects, including cotton swabs, into your ear. You can clean the opening of your ear canal with a washcloth or facial tissue.  Follow instructions from your health  care provider about cleaning your ears. Do not over-clean your ears.  Drink enough fluid to keep your urine clear or pale yellow. This will help to thin the earwax.  Keep all follow-up visits as told by your health care provider. If earwax builds up in your ears often or if you use hearing aids, consider seeing your health care provider for routine, preventive ear cleanings. Ask your health care provider how often you should schedule your cleanings.  If you have hearing aids, clean them according to instructions from the manufacturer and your health care provider. Contact a health care provider if:  You have ear pain.  You develop a fever.  You have blood, pus, or other fluid coming from your ear.  You have hearing loss.  You have ringing in your ears that does not go away.  Your symptoms do not improve with treatment.  You feel like the room is spinning (vertigo). Summary  Earwax can build up in the ear and cause discomfort or hearing loss.  The most common symptoms of this condition include reduced or muffled hearing and a feeling of fullness in the ear or feeling that the ear is plugged.  This condition may be diagnosed based on your symptoms, your medical history, and an ear exam.  This condition may be treated by using ear drops to soften the earwax or by having the earwax removed by a health care provider.  Do   not put any objects, including cotton swabs, into your ear. You can clean the opening of your ear canal with a washcloth or facial tissue. This information is not intended to replace advice given to you by your health care provider. Make sure you discuss any questions you have with your health care provider. Document Released: 04/21/2004 Document Revised: 05/25/2016 Document Reviewed: 05/25/2016 Elsevier Interactive Patient Education  [employer].  Please continue with daily OTC Claritin and use ear drops as directed. Increase water intake. If symptoms  persist/worsen into next week, please call clinic. HAVE A GOOD TRIP! NICE TO SEE YOU!

## 2017-08-15 NOTE — Progress Notes (Deleted)
Subjective:    Patient ID: Matthew [name], male    DOB: [birth year], 57 y.o.   MRN: [id number]  HPI:  04/11/16 OV: Matthew [name] presents to establish as a new pt.  He has not been working with a PCP, however using Urgent Care for all health care needs.  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 He is smoking "about a pack a day", since high school.    04/12/17 OV: Matthew [name] was seen 04/2016 for acute illness, however never followed up for HTN as directed. He was admitted for hypoxia 03/30/17-04/01/17 Hosp Summary: "57 y.o.malewithhistory of tobacco abuse and hypertension presently not on medications presents to the ER with complaints of worsening shortness of breath. Patient symptoms started 4 days ago with fever and chills. Since then patient has been having wheezing with shortness of breath. Has benign productive cough which is acutely worsened today. Has some chest pain on coughing otherwise chest pain-free. Denies any recent travel or sick contacts. In the ER patient is found to be wheezing and EKG shows sinus tachycardia. CT angiogram of the chest done shows bronchopneumonia with and possible bronchiolitis. Patient is placed on empiric antibiotics and admitted for further management"  He is here for hospital f/u: Reviewed all labs, imaging, course of tx.  He completed 4 days of Azithromycin and Ceftin per hospital instructions. He has been tobacco free for 14 days-GREAT JOB! Overall he is feeling much better each day. He has not taken Eplerenone 50mg  BID in months and has not seen Dr. Dellie Catholic Specialist in months.  08/16/17 OV: Matthew [name] is here for He reports medication compliance on Lisinopril 5mg  QD  He has schedule appt with Dr. Satira Mccallum [facility]    Patient Care Team    Relationship Specialty Notifications Start End  Esaw Grandchild, NP PCP - General Family Medicine  04/11/16     Patient Active Problem List   Diagnosis  Date Noted  . Ear build-up, left 08/10/2017  . Acute maxillary sinusitis 07/25/2017  . Left otitis media 07/17/2017  . Hospital discharge follow-up 04/12/2017  . Hypertension 04/12/2017  . CAP (community acquired pneumonia) 03/30/2017  . Acute respiratory failure with hypoxia ([place]) 03/30/2017  . Acute bronchitis 03/30/2017  . Acute otitis media, right 05/05/2016  . Pharyngitis 05/05/2016     Past Medical History:  Diagnosis Date  . CAP (community acquired pneumonia) 03/31/2017  . COPD (chronic obstructive pulmonary disease) ([place])   . Hypertension      Past Surgical History:  Procedure Laterality Date  . HERNIA REPAIR    . HERNIA REPAIR  1966     Family History  Problem Relation Age of Onset  . Lymphoma Father   . Cancer Mother        breast     Social History   Substance and Sexual Activity  Drug Use Yes  . Frequency: 2.0 times per week  . Types: Marijuana     Social History   Substance and Sexual Activity  Alcohol Use Yes  . Alcohol/week: 1.2 - 2.4 oz  . Types: 2 - 4 Standard drinks or equivalent per week   Comment: 7 cans of beer a week.     Social History   Tobacco Use  Smoking Status Former Smoker  . Packs/day: 1.00  . Types: Cigarettes  . Last attempt to quit: 03/30/2017  . Years since quitting: 0.3  Smokeless Tobacco Never Used     Outpatient  Encounter Medications as of 08/16/2017  Medication Sig  . albuterol (PROVENTIL HFA;VENTOLIN HFA) 108 (90 Base) MCG/ACT inhaler Inhale 2 puffs into the lungs every 4 (four) hours as needed for wheezing or shortness of breath.  . fluticasone (FLONASE) 50 MCG/ACT nasal spray Place 2 sprays into both nostrils daily.  Marland Kitchen lisinopril (PRINIVIL,ZESTRIL) 5 MG tablet Take 1 tablet (5 mg total) by mouth daily.  Marland Kitchen ofloxacin (FLOXIN) 0.3 % OTIC solution Place 5 drops into the left ear daily.  . Vitamin D, Ergocalciferol, (DRISDOL) 50000 units CAPS capsule Take 1 capsule (50,000 Units total) by mouth every 7 (seven)  days.   No facility-administered encounter medications on file as of 08/16/2017.     Allergies: Patient has no known allergies.  There is no height or weight on file to calculate BMI.  There were no vitals taken for this visit.     Review of Systems  Constitutional: Negative for activity change, appetite change, chills, diaphoresis, fatigue, fever and unexpected weight change.  Eyes: Positive for discharge, redness and itching. Negative for visual disturbance.  Respiratory: Negative for chest tightness, shortness of breath and wheezing.   Cardiovascular: Negative for chest pain, palpitations and leg swelling.  Gastrointestinal: Negative for constipation, diarrhea and nausea.  Endocrine: Negative for cold intolerance, heat intolerance, polydipsia and polyphagia.  Genitourinary: Negative for difficulty urinating.  Musculoskeletal: Positive for arthralgias and myalgias. Negative for gait problem.  Skin: Negative for color change.  Neurological: Negative for dizziness, speech difficulty and numbness.  Psychiatric/Behavioral: Negative for agitation and behavioral problems.       Objective:   Physical Exam  Constitutional: He is oriented to person, place, and time. He appears well-developed and well-nourished. No distress.  HENT:  Head: Normocephalic and atraumatic.  Right Ear: External ear normal.  Left Ear: External ear normal.  Eyes: Pupils are equal, round, and reactive to light. Conjunctivae are normal.  Neck: Normal range of motion. Neck supple.  Cardiovascular: Normal rate, regular rhythm, normal heart sounds and intact distal pulses.  No murmur heard. Pulmonary/Chest: Effort normal and breath sounds normal. No respiratory distress. He has no wheezes. He has no rales. He exhibits no tenderness.  Abdominal: Soft. Bowel sounds are normal. There is no tenderness.  Musculoskeletal: Normal range of motion.  Neurological: He is alert and oriented to person, place, and time. He  has normal reflexes.  Skin: Skin is warm and dry. No rash noted. He is not diaphoretic. No erythema. No pallor.  Psychiatric: He has a normal mood and affect. His behavior is normal. Judgment and thought content normal.  Nursing note and vitals reviewed.     Assessment & Plan:   No diagnosis found.  No problem-specific Assessment & Plan notes found for this encounter.    FOLLOW-UP:  No follow-ups on file.

## 2017-08-16 ENCOUNTER — Ambulatory Visit: Payer: 59 | Admitting: Adult Health

## 2017-09-08 ENCOUNTER — Other Ambulatory Visit: Payer: Self-pay | Admitting: Adult Health

## 2017-09-11 ENCOUNTER — Telehealth: Payer: Self-pay | Admitting: Adult Health

## 2017-09-11 NOTE — Telephone Encounter (Signed)
Noted MPulliam, CMA/RT(R)  

## 2017-09-11 NOTE — Telephone Encounter (Signed)
Provider required OV --- Left patient message to call office to set up appointment.--glh

## 2017-10-16 ENCOUNTER — Ambulatory Visit (INDEPENDENT_AMBULATORY_CARE_PROVIDER_SITE_OTHER): Payer: 59 | Admitting: Adult Health

## 2017-10-16 ENCOUNTER — Encounter: Payer: Self-pay | Admitting: Adult Health

## 2017-10-16 VITALS — BP 155/92 | HR 70 | Ht 62.25 in | Wt 166.2 lb

## 2017-10-16 DIAGNOSIS — I10 Essential (primary) hypertension: Secondary | ICD-10-CM

## 2017-10-16 DIAGNOSIS — Z0000 Encounter for general adult medical examination without abnormal findings: Secondary | ICD-10-CM

## 2017-10-16 DIAGNOSIS — R197 Diarrhea, unspecified: Secondary | ICD-10-CM

## 2017-10-16 HISTORY — DX: Diarrhea, unspecified: R19.7

## 2017-10-16 MED ORDER — LISINOPRIL 10 MG PO TABS: 10.0000 mg | ORAL_TABLET | Freq: Every day | ORAL | 2 refills | Status: DC

## 2017-10-16 NOTE — Patient Instructions (Addendum)
Mediterranean Diet A Mediterranean diet refers to food and lifestyle choices that are based on the traditions of countries located on the [place]. This way of eating has been shown to help prevent certain conditions and improve outcomes for people who have chronic diseases, like kidney disease and heart disease. What are tips for following this plan? Lifestyle  Cook and eat meals together with your family, when possible.  Drink enough fluid to keep your urine clear or pale yellow.  Be physically active every day. This includes: ? Aerobic exercise like running or swimming. ? Leisure activities like gardening, walking, or housework.  Get 7-8 hours of sleep each night.  If recommended by your health care provider, drink red wine in moderation. This means 1 glass a day for nonpregnant women and 2 glasses a day for men. A glass of wine equals 5 oz (150 mL). Reading food labels  Check the serving size of packaged foods. For foods such as rice and pasta, the serving size refers to the amount of cooked product, not dry.  Check the total fat in packaged foods. Avoid foods that have saturated fat or trans fats.  Check the ingredients list for added sugars, such as corn syrup. Shopping  At the grocery store, buy most of your food from the areas near the walls of the store. This includes: ? Fresh fruits and vegetables (produce). ? Grains, beans, nuts, and seeds. Some of these may be available in unpackaged forms or large amounts (in bulk). ? Fresh seafood. ? Poultry and eggs. ? Low-fat dairy products.  Buy whole ingredients instead of prepackaged foods.  Buy fresh fruits and vegetables in-season from local farmers markets.  Buy frozen fruits and vegetables in resealable bags.  If you do not have access to quality fresh seafood, buy precooked frozen shrimp or canned fish, such as tuna, salmon, or sardines.  Buy small amounts of raw or cooked vegetables, salads, or olives from the  deli or salad bar at your store.  Stock your pantry so you always have certain foods on hand, such as olive oil, canned tuna, canned tomatoes, rice, pasta, and beans. Cooking  Cook foods with extra-virgin olive oil instead of using butter or other vegetable oils.  Have meat as a side dish, and have vegetables or grains as your main dish. This means having meat in small portions or adding small amounts of meat to foods like pasta or stew.  Use beans or vegetables instead of meat in common dishes like chili or lasagna.  Experiment with different cooking methods. Try roasting or broiling vegetables instead of steaming or sauteing them.  Add frozen vegetables to soups, stews, pasta, or rice.  Add nuts or seeds for added healthy fat at each meal. You can add these to yogurt, salads, or vegetable dishes.  Marinate fish or vegetables using olive oil, lemon juice, garlic, and fresh herbs. Meal planning  Plan to eat 1 vegetarian meal one day each week. Try to work up to 2 vegetarian meals, if possible.  Eat seafood 2 or more times a week.  Have healthy snacks readily available, such as: ? Vegetable sticks with hummus. ? Greek yogurt. ? Fruit and nut trail mix.  Eat balanced meals throughout the week. This includes: ? Fruit: 2-3 servings a day ? Vegetables: 4-5 servings a day ? Low-fat dairy: 2 servings a day ? Fish, poultry, or lean meat: 1 serving a day ? Beans and legumes: 2 or more servings a week ? Nuts   and seeds: 1-2 servings a day ? Whole grains: 6-8 servings a day ? Extra-virgin olive oil: 3-4 servings a day  Limit red meat and sweets to only a few servings a month What are my food choices?  Mediterranean diet ? Recommended ? Grains: Whole-grain pasta. Brown rice. Bulgar wheat. Polenta. Couscous. Whole-wheat bread. [place]. ? Vegetables: Artichokes. Beets. Broccoli. Cabbage. Carrots. Eggplant. Green beans. Chard. Kale. Spinach. Onions. Leeks. Peas. Squash.  Tomatoes. Peppers. Radishes. ? Fruits: Apples. Apricots. Avocado. Berries. Bananas. Cherries. Dates. Figs. Grapes. Lemons. Melon. Oranges. Peaches. Plums. Pomegranate. ? Meats and other protein foods: Beans. Almonds. Sunflower seeds. Pine nuts. Peanuts. [place]. Salmon. Scallops. Shrimp. [place]. Tilapia. Clams. Oysters. Eggs. ? Dairy: Low-fat milk. Cheese. Greek yogurt. ? Beverages: Water. Red wine. Herbal tea. ? Fats and oils: Extra virgin olive oil. Avocado oil. Grape seed oil. ? Sweets and desserts: [place] yogurt with honey. Baked apples. Poached pears. Trail mix. ? Seasoning and other foods: Basil. Cilantro. Coriander. Cumin. Mint. Parsley. Sage. Rosemary. Tarragon. Garlic. Oregano. Thyme. Pepper. Balsalmic vinegar. Tahini. Hummus. Tomato sauce. Olives. Mushrooms. ? Limit these ? Grains: Prepackaged pasta or rice dishes. Prepackaged cereal with added sugar. ? Vegetables: Deep fried potatoes (french fries). ? Fruits: Fruit canned in syrup. ? Meats and other protein foods: Beef. Pork. Lamb. Poultry with skin. Hot dogs. [place] Salines. ? Dairy: Ice cream. Sour cream. Whole milk. ? Beverages: Juice. Sugar-sweetened soft drinks. Beer. Liquor and spirits. ? Fats and oils: Butter. Canola oil. Vegetable oil. Beef fat (tallow). Lard. ? Sweets and desserts: Cookies. Cakes. Pies. Candy. ? Seasoning and other foods: Mayonnaise. Premade sauces and marinades. ? The items listed may not be a complete list. Talk with your dietitian about what dietary choices are right for you. Summary  The Mediterranean diet includes both food and lifestyle choices.  Eat a variety of fresh fruits and vegetables, beans, nuts, seeds, and whole grains.  Limit the amount of red meat and sweets that you eat.  Talk with your health care provider about whether it is safe for you to drink red wine in moderation. This means 1 glass a day for nonpregnant women and 2 glasses a day for men. A glass of wine equals 5 oz (150 mL). This information  is not intended to replace advice given to you by your health care provider. Make sure you discuss any questions you have with your health care provider. Document Released: 11/05/2015 Document Revised: 12/08/2015 Document Reviewed: 11/05/2015 Elsevier Interactive Patient Education  2018 [employer].   Hypertension Hypertension, commonly called high blood pressure, is when the force of blood pumping through the arteries is too strong. The arteries are the blood vessels that carry blood from the heart throughout the body. Hypertension forces the heart to work harder to pump blood and may cause arteries to become narrow or stiff. Having untreated or uncontrolled hypertension can cause heart attacks, strokes, kidney disease, and other problems. A blood pressure reading consists of a higher number over a lower number. Ideally, your blood pressure should be below 120/80. The first ("top") number is called the systolic pressure. It is a measure of the pressure in your arteries as your heart beats. The second ("bottom") number is called the diastolic pressure. It is a measure of the pressure in your arteries as the heart relaxes. What are the causes? The cause of this condition is not known. What increases the risk? Some risk factors for high blood pressure are under your control. Others are not. Factors you can  change  Smoking.  Having type 2 diabetes mellitus, high cholesterol, or both.  Not getting enough exercise or physical activity.  Being overweight.  Having too much fat, sugar, calories, or salt (sodium) in your diet.  Drinking too much alcohol. Factors that are difficult or impossible to change  Having chronic kidney disease.  Having a family history of high blood pressure.  Age. Risk increases with age.  Race. You may be at higher risk if you are African-American.  Gender. Men are at higher risk than women before age 70. After age 39, women are at higher risk than  men.  Having obstructive sleep apnea.  Stress. What are the signs or symptoms? Extremely high blood pressure (hypertensive crisis) may cause:  Headache.  Anxiety.  Shortness of breath.  Nosebleed.  Nausea and vomiting.  Severe chest pain.  Jerky movements you cannot control (seizures).  How is this diagnosed? This condition is diagnosed by measuring your blood pressure while you are seated, with your arm resting on a surface. The cuff of the blood pressure monitor will be placed directly against the skin of your upper arm at the level of your heart. It should be measured at least twice using the same arm. Certain conditions can cause a difference in blood pressure between your right and left arms. Certain factors can cause blood pressure readings to be lower or higher than normal (elevated) for a short period of time:  When your blood pressure is higher when you are in a health care provider's office than when you are at home, this is called white coat hypertension. Most people with this condition do not need medicines.  When your blood pressure is higher at home than when you are in a health care provider's office, this is called masked hypertension. Most people with this condition may need medicines to control blood pressure.  If you have a high blood pressure reading during one visit or you have normal blood pressure with other risk factors:  You may be asked to return on a different day to have your blood pressure checked again.  You may be asked to monitor your blood pressure at home for 1 week or longer.  If you are diagnosed with hypertension, you may have other blood or imaging tests to help your health care provider understand your overall risk for other conditions. How is this treated? This condition is treated by making healthy lifestyle changes, such as eating healthy foods, exercising more, and reducing your alcohol intake. Your health care provider may prescribe  medicine if lifestyle changes are not enough to get your blood pressure under control, and if:  Your systolic blood pressure is above 130.  Your diastolic blood pressure is above 80.  Your personal target blood pressure may vary depending on your medical conditions, your age, and other factors. Follow these instructions at home: Eating and drinking  Eat a diet that is high in fiber and potassium, and low in sodium, added sugar, and fat. An example eating plan is called the DASH (Dietary Approaches to Stop Hypertension) diet. To eat this way: ? Eat plenty of fresh fruits and vegetables. Try to fill half of your plate at each meal with fruits and vegetables. ? Eat whole grains, such as whole wheat pasta, brown rice, or whole grain bread. Fill about one quarter of your plate with whole grains. ? Eat or drink low-fat dairy products, such as skim milk or low-fat yogurt. ? Avoid fatty cuts of meat, processed  or cured meats, and poultry with skin. Fill about one quarter of your plate with lean proteins, such as fish, chicken without skin, beans, eggs, and tofu. ? Avoid premade and processed foods. These tend to be higher in sodium, added sugar, and fat.  Reduce your daily sodium intake. Most people with hypertension should eat less than 1,500 mg of sodium a day.  Limit alcohol intake to no more than 1 drink a day for nonpregnant women and 2 drinks a day for men. One drink equals 12 oz of beer, 5 oz of wine, or 1 oz of hard liquor. Lifestyle  Work with your health care provider to maintain a healthy body weight or to lose weight. Ask what an ideal weight is for you.  Get at least 30 minutes of exercise that causes your heart to beat faster (aerobic exercise) most days of the week. Activities may include walking, swimming, or biking.  Include exercise to strengthen your muscles (resistance exercise), such as pilates or lifting weights, as part of your weekly exercise routine. Try to do these types  of exercises for 30 minutes at least 3 days a week.  Do not use any products that contain nicotine or tobacco, such as cigarettes and e-cigarettes. If you need help quitting, ask your health care provider.  Monitor your blood pressure at home as told by your health care provider.  Keep all follow-up visits as told by your health care provider. This is important. Medicines  Take over-the-counter and prescription medicines only as told by your health care provider. Follow directions carefully. Blood pressure medicines must be taken as prescribed.  Do not skip doses of blood pressure medicine. Doing this puts you at risk for problems and can make the medicine less effective.  Ask your health care provider about side effects or reactions to medicines that you should watch for. Contact a health care provider if:  You think you are having a reaction to a medicine you are taking.  You have headaches that keep coming back (recurring).  You feel dizzy.  You have swelling in your ankles.  You have trouble with your vision. Get help right away if:  You develop a severe headache or confusion.  You have unusual weakness or numbness.  You feel faint.  You have severe pain in your chest or abdomen.  You vomit repeatedly.  You have trouble breathing. Summary  Hypertension is when the force of blood pumping through your arteries is too strong. If this condition is not controlled, it may put you at risk for serious complications.  Your personal target blood pressure may vary depending on your medical conditions, your age, and other factors. For most people, a normal blood pressure is less than 120/80.  Hypertension is treated with lifestyle changes, medicines, or a combination of both. Lifestyle changes include weight loss, eating a healthy, low-sodium diet, exercising more, and limiting alcohol. This information is not intended to replace advice given to you by your health care provider.  Make sure you discuss any questions you have with your health care provider. Document Released: 03/14/2005 Document Revised: 02/10/2016 Document Reviewed: 02/10/2016 Elsevier Interactive Patient Education  2018 [place].   Increase Lisinopril from 5mg  to 10mg  once daily. Continue to check blood pressure and heart rate several times/week. Please call clinic in 2 weeks and report readings. If systolic pressure is consistently >140 (first number) or if diastolic is consistently >40 (second number), please call clinic sooner. Referral to Gastroenterologist placed, re: loose  stools and have colonoscopy completed. Recommend complete physical with fasting labs in 6 months. NICE TO SEE YOU!

## 2017-10-16 NOTE — Assessment & Plan Note (Signed)
1-6 loose stools/day- for the last year Referral to Gastroenterologist placed, re: loose stools and have colonoscopy completed.

## 2017-10-16 NOTE — Assessment & Plan Note (Signed)
BP above goal 151/76, HR 70 Home readings SBP 130-180, DBP 80-90s Increased Lisinopril from 5mg  to 10mg  QD Increase Lisinopril from 5mg  to 10mg  once daily. Continue to check blood pressure and heart rate several times/week. Please call clinic in 2 weeks and report readings. If systolic pressure is consistently >140 (first number) or if diastolic is consistently >35 (second number), please call clinic sooner.

## 2017-10-16 NOTE — Progress Notes (Signed)
Subjective:    Patient ID: Matthew [name], male    DOB: 11/05/1960, 57 y.o.   MRN: [id number]  HPI: Matthew [name] is her for f/u: HTN and new complaint if loose stools. He has been taking Lisinopril 5mg  QD, home readings- SBP 130-180s, DBP 80-90s He denies CP/dyspnea/dizziness/HA/palpitations. He reports 1-6 loose stools/day- increase in number of BMs over the last year. He reports intermittent bright red blood on tissue paper when he wipes, he has hx of hemorrhoids. He denies abdominal pain He denies family hx of colon ca He denies change in diet in the last year   Patient Care Team    Relationship Specialty Notifications Start End  Esaw Grandchild, NP PCP - General Family Medicine  04/11/16     Patient Active Problem List   Diagnosis Date Noted  . Diarrhea 10/16/2017  . Healthcare maintenance 10/16/2017  . Ear build-up, left 08/10/2017  . Acute maxillary sinusitis 07/25/2017  . Left otitis media 07/17/2017  . Hospital discharge follow-up 04/12/2017  . Hypertension 04/12/2017  . CAP (community acquired pneumonia) 03/30/2017  . Acute respiratory failure with hypoxia ([place]) 03/30/2017  . Acute bronchitis 03/30/2017  . Acute otitis media, right 05/05/2016  . Pharyngitis 05/05/2016     Past Medical History:  Diagnosis Date  . CAP (community acquired pneumonia) 03/31/2017  . COPD (chronic obstructive pulmonary disease) ([place])   . Hypertension      Past Surgical History:  Procedure Laterality Date  . HERNIA REPAIR    . HERNIA REPAIR  1966     Family History  Problem Relation Age of Onset  . Lymphoma Father   . Cancer Mother        breast     Social History   Substance and Sexual Activity  Drug Use Yes  . Frequency: 2.0 times per week  . Types: Marijuana     Social History   Substance and Sexual Activity  Alcohol Use Yes  . Alcohol/week: 1.2 - 2.4 oz  . Types: 2 - 4 Standard drinks or equivalent per week   Comment: 7 cans of beer a week.      Social History   Tobacco Use  Smoking Status Former Smoker  . Packs/day: 1.00  . Types: Cigarettes  . Last attempt to quit: 03/30/2017  . Years since quitting: 0.5  Smokeless Tobacco Never Used     Outpatient Encounter Medications as of 10/16/2017  Medication Sig  . albuterol (PROVENTIL HFA;VENTOLIN HFA) 108 (90 Base) MCG/ACT inhaler Inhale 2 puffs into the lungs every 4 (four) hours as needed for wheezing or shortness of breath.  . fluticasone (FLONASE) 50 MCG/ACT nasal spray Place 2 sprays into both nostrils daily.  Marland Kitchen lisinopril (PRINIVIL,ZESTRIL) 10 MG tablet Take 1 tablet (10 mg total) by mouth daily. Patient needs office visit for further refills  . Vitamin D, Ergocalciferol, (DRISDOL) 50000 units CAPS capsule Take 1 capsule (50,000 Units total) by mouth every 7 (seven) days.  . [DISCONTINUED] lisinopril (PRINIVIL,ZESTRIL) 5 MG tablet Take 1 tablet (5 mg total) by mouth daily. Patient needs office visit for further refills  . [DISCONTINUED] ofloxacin (FLOXIN) 0.3 % OTIC solution Place 5 drops into the left ear daily.   No facility-administered encounter medications on file as of 10/16/2017.     Allergies: Patient has no known allergies.  Body mass index is 30.15 kg/m.  Blood pressure (!) 155/92, pulse 70, height 5' 2.25" (1.581 m), weight 166 lb 3.2 oz (75.4 kg), SpO2 97 %.  Review of Systems  Constitutional: Positive for fatigue. Negative for activity change, appetite change, chills, diaphoresis, fever and unexpected weight change.  HENT: Negative for congestion.   Eyes: Negative for visual disturbance.  Respiratory: Negative for cough, chest tightness, shortness of breath, wheezing and stridor.   Cardiovascular: Negative for chest pain, palpitations and leg swelling.  Gastrointestinal: Positive for blood in stool and diarrhea. Negative for abdominal distention, abdominal pain, anal bleeding, constipation, nausea, rectal pain and vomiting.  Genitourinary: Negative  for difficulty urinating and flank pain.  Neurological: Negative for dizziness and headaches.  Hematological: Does not bruise/bleed easily.  Psychiatric/Behavioral: Negative for confusion, decreased concentration, dysphoric mood, hallucinations, self-injury, sleep disturbance and suicidal ideas. The patient is not nervous/anxious and is not hyperactive.        Objective:   Physical Exam  Constitutional: He is oriented to person, place, and time. He appears well-developed and well-nourished. No distress.  Eyes: Pupils are equal, round, and reactive to light. Conjunctivae and EOM are normal.  Cardiovascular: Normal rate, regular rhythm, normal heart sounds and intact distal pulses.  No murmur heard. Pulmonary/Chest: Effort normal and breath sounds normal. No stridor. No respiratory distress. He has no wheezes. He has no rales. He exhibits no tenderness.  Abdominal: Soft. Bowel sounds are normal. He exhibits no distension and no mass. There is no tenderness. There is no rigidity, no rebound, no guarding and no CVA tenderness. No hernia.  Neurological: He is alert and oriented to person, place, and time.  Skin: Skin is warm and dry. Capillary refill takes less than 2 seconds. He is not diaphoretic. No erythema. No pallor.  Psychiatric: He has a normal mood and affect. His behavior is normal. Judgment and thought content normal.  Nursing note and vitals reviewed.     Assessment & Plan:   1. Hypertension, unspecified type   2. Diarrhea, unspecified type   3. Healthcare maintenance     Hypertension BP above goal 151/76, HR 70 Home readings SBP 130-180, DBP 80-90s Increased Lisinopril from 5mg  to 10mg  QD Increase Lisinopril from 5mg  to 10mg  once daily. Continue to check blood pressure and heart rate several times/week. Please call clinic in 2 weeks and report readings. If systolic pressure is consistently >140 (first number) or if diastolic is consistently >45 (second number), please call  clinic sooner.   Diarrhea 1-6 loose stools/day- for the last year Referral to Gastroenterologist placed, re: loose stools and have colonoscopy completed.   Healthcare maintenance Recommend complete physical with fasting labs in 6 months.    FOLLOW-UP:  Return in about 6 months (around 04/18/2018) for CPE.

## 2017-10-16 NOTE — Assessment & Plan Note (Signed)
Recommend complete physical with fasting labs in 6 months.

## 2017-10-17 ENCOUNTER — Encounter: Payer: Self-pay | Admitting: Gastroenterology

## 2017-12-13 ENCOUNTER — Encounter: Payer: Self-pay | Admitting: Gastroenterology

## 2017-12-13 ENCOUNTER — Other Ambulatory Visit (INDEPENDENT_AMBULATORY_CARE_PROVIDER_SITE_OTHER): Payer: 59

## 2017-12-13 ENCOUNTER — Ambulatory Visit: Payer: 59 | Admitting: Gastroenterology

## 2017-12-13 VITALS — BP 158/80 | HR 76 | Ht 62.0 in | Wt 166.0 lb

## 2017-12-13 DIAGNOSIS — R197 Diarrhea, unspecified: Secondary | ICD-10-CM

## 2017-12-13 DIAGNOSIS — Z1211 Encounter for screening for malignant neoplasm of colon: Secondary | ICD-10-CM

## 2017-12-13 LAB — CBC WITH DIFFERENTIAL/PLATELET
BASOS PCT: 1 % (ref 0.0–3.0)
Basophils Absolute: 0.1 10*3/uL (ref 0.0–0.1)
EOS PCT: 3.5 % (ref 0.0–5.0)
Eosinophils Absolute: 0.3 10*3/uL (ref 0.0–0.7)
HEMATOCRIT: 42.9 % (ref 39.0–52.0)
HEMOGLOBIN: 14.8 g/dL (ref 13.0–17.0)
Lymphocytes Relative: 16.2 % (ref 12.0–46.0)
Lymphs Abs: 1.4 10*3/uL (ref 0.7–4.0)
MCHC: 34.6 g/dL (ref 30.0–36.0)
MCV: 92 fl (ref 78.0–100.0)
MONOS PCT: 12.5 % — AB (ref 3.0–12.0)
Monocytes Absolute: 1.1 10*3/uL — ABNORMAL HIGH (ref 0.1–1.0)
Neutro Abs: 5.6 10*3/uL (ref 1.4–7.7)
Neutrophils Relative %: 66.8 % (ref 43.0–77.0)
Platelets: 289 10*3/uL (ref 150.0–400.0)
RBC: 4.66 Mil/uL (ref 4.22–5.81)
RDW: 13.1 % (ref 11.5–15.5)
WBC: 8.4 10*3/uL (ref 4.0–10.5)

## 2017-12-13 LAB — COMPREHENSIVE METABOLIC PANEL
ALK PHOS: 83 U/L (ref 39–117)
ALT: 18 U/L (ref 0–53)
AST: 17 U/L (ref 0–37)
Albumin: 4.3 g/dL (ref 3.5–5.2)
BUN: 17 mg/dL (ref 6–23)
CO2: 30 meq/L (ref 19–32)
Calcium: 9.1 mg/dL (ref 8.4–10.5)
Chloride: 102 mEq/L (ref 96–112)
Creatinine, Ser: 0.86 mg/dL (ref 0.40–1.50)
GFR: 97.35 mL/min (ref >60.00)
GLUCOSE: 103 mg/dL — AB (ref 70–99)
POTASSIUM: 4.3 meq/L (ref 3.5–5.1)
SODIUM: 138 meq/L (ref 135–145)
TOTAL PROTEIN: 7.2 g/dL (ref 6.0–8.3)
Total Bilirubin: 0.4 mg/dL (ref 0.2–1.2)

## 2017-12-13 LAB — PROTIME-INR
INR: 0.9 ratio (ref 0.8–1.0)
Prothrombin Time: 10 s (ref 9.6–13.1)

## 2017-12-13 LAB — C-REACTIVE PROTEIN: CRP: 2 mg/dL (ref 0.5–20.0)

## 2017-12-13 LAB — SEDIMENTATION RATE: Sed Rate: 13 mm/hr (ref 0–20)

## 2017-12-13 MED ORDER — NA SULFATE-K SULFATE-MG SULF 17.5-3.13-1.6 GM/177ML PO SOLN: 1.0000 | Freq: Once | ORAL | 0 refills | Status: AC

## 2017-12-13 NOTE — Patient Instructions (Addendum)
Your provider has requested that you go to the basement level for lab work before leaving today. Press "B" on the elevator. The lab is located at the first door on the left as you exit the elevator.  You have been scheduled for a colonoscopy. Please follow written instructions given to you at your visit today.  Please pick up your prep supplies at the pharmacy within the next 1-3 days. If you use inhalers (even only as needed), please bring them with you on the day of your procedure. Your physician has requested that you go to www.startemmi.com and enter the access code given to you at your visit today. This web site gives a general overview about your procedure. However, you should still follow specific instructions given to you by our office regarding your preparation for the procedure.   Take Zenpep trial for 2 days  2 capsules with meals and 1 capsule with snacks for 2 days Samples given  Start Zenpep after Lactose free diet   Follow up after your colonoscopy    Lactose-Free Diet, Adult If you have lactose intolerance, you are not able to digest lactose. Lactose is a natural sugar found mainly in milk and milk products. You may need to avoid all foods and beverages that contain lactose. A lactose-free diet can help you do this. What do I need to know about this diet?  Do not consume foods, beverages, vitamins, minerals, or medicines with lactose. Read ingredients lists carefully.  Look for the words "lactose-free" on labels.  Use lactase enzyme drops or tablets as directed by your health care provider.  Use lactose-free milk or a milk alternative, such as soy milk, for drinking and cooking.  Make sure you get enough calcium and vitamin D in your diet. A lactose-free eating plan can be lacking in these important nutrients.  Take calcium and vitamin D supplements as directed by your health care provider. Talk to your provider about supplements if you are not able to get enough calcium  and vitamin D from food. Which foods have lactose? Lactose is found in:  Milk and foods made from milk.  Yogurt.  Cheese.  Butter.  Margarine.  Sour cream.  Cream.  Whipped toppings and nondairy creamers.  Ice cream and other milk-based desserts.  Lactose is also found in foods or products made with milk or milk ingredients. To find out whether a food contains milk or a milk ingredient, look at the ingredients list. Avoid foods with the statement "May contain milk" and foods that contain:  Butter.  Cream.  Milk.  Milk solids.  Milk powder.  Whey.  Curd.  Caseinate.  Lactose.  Lactalbumin.  Lactoglobulin.  What are some alternatives to milk and foods made with milk products?  Lactose-free milk.  Soy milk with added calcium and vitamin D.  Almond, coconut, or rice milk with added calcium and vitamin D. Note that these are low in protein.  Soy products, such as soy yogurt, soy cheese, soy ice cream, and soy-based sour cream. Which foods can I eat? Grains Breads and rolls made without milk, such as [place], [place], or [place] bread, bagels, pita, and [employer]. Corn tortillas, corn meal, grits, and polenta. Crackers without lactose or milk solids, such as soda crackers and graham crackers. Cooked or dry cereals without lactose or milk solids. Pasta, quinoa, couscous, barley, oats, bulgur, farro, rice, wild rice, or other grains prepared without milk or lactose. Plain popcorn. Vegetables Fresh, frozen, and canned vegetables without cheese, cream,  or butter sauces. Fruits All fresh, canned, frozen, or dried fruits that are not processed with lactose. Meats and Other Protein Sources Plain beef, chicken, fish, [place], lamb, veal, pork, wild game, or ham. Kosher-prepared meat products. Strained or junior meats that do not contain milk. Eggs. Soy meat substitutes. Beans, lentils, and hummus. Tofu. Nuts and seeds. Peanut or other nut butters without lactose. Soups,  casseroles, and mixed dishes without cheese, cream, or milk. Dairy Lactose-free milk. Soy, rice, or almond milk with added calcium and vitamin D. Soy cheese and yogurt. Beverages Carbonated drinks. Tea. Coffee, freeze-dried coffee, and some instant coffees. Fruit and vegetable juices. Condiments Soy sauce. Carob powder. Olives. Gravy made with water. Baker's cocoa. Angie Fava. Pure seasonings and spices. Ketchup. Mustard. Bouillon. Broth. Sweets and Desserts Water and fruit ices. Gelatin. Cookies, pies, or cakes made from allowed ingredients, such as angel food cake. Pudding made with water or a milk substitute. Lactose-free tofu desserts. Soy, coconut milk, or rice-milk-based frozen desserts. Sugar. Honey. Jam, jelly, and marmalade. Molasses. Pure sugar candy. Dark chocolate without milk. Marshmallows. Fats and Oils Margarines and salad dressings that do not contain milk. Berniece Salines. Vegetable oils. Shortening. Mayonnaise. Soy or coconut-based cream. The items listed above may not be a complete list of recommended foods or beverages. Contact your dietitian for more options. Which foods are not recommended? Grains Breads and rolls that contain milk. Toaster pastries. Muffins, biscuits, waffles, cornbread, and pancakes. These can be prepared at home, commercial, or from mixes. Sweet rolls, donuts, English muffins, fry bread, lefse, flour tortillas with lactose, or [place] toast made with milk or milk ingredients. Crackers that contain lactose. Corn curls. Cooked or dry cereals with lactose. Vegetables Creamed or breaded vegetables. Vegetables in a cheese or butter sauce or with lactose-containing margarines. Instant potatoes. [place] fries. Scalloped or au gratin potatoes. Fruits None. Meats and Other Protein Sources Scrambled eggs, omelets, and souffles that contain milk. Creamed or breaded meat, fish, chicken, or [place]. Sausage products, such as wieners and liver sausage. Cold cuts that contain milk  solids. Cheese, cottage cheese, ricotta cheese, and cheese spreads. Lasagna and macaroni and cheese. Pizza. Peanut or other nut butters with added milk solids. Casseroles or mixed dishes containing milk or cheese. Dairy All dairy products, including milk, goat's milk, buttermilk, kefir, acidophilus milk, flavored milk, evaporated milk, condensed milk, dulce de [place], eggnog, yogurt, cheese, and cheese spreads. Beverages Hot chocolate. Cocoa with lactose. Instant iced teas. Powdered fruit drinks. Smoothies made with milk or yogurt. Condiments Chewing gum that has lactose. Cocoa that has lactose. Spice blends if they contain milk products. Artificial sweeteners that contain lactose. Nondairy creamers. Sweets and Desserts Ice cream, ice milk, gelato, sherbet, and frozen yogurt. Custard, pudding, and mousse. Cake, cream pies, cookies, and other desserts containing milk, cream, cream cheese, or milk chocolate. Pie crust made with milk-containing margarine or butter. Reduced-calorie desserts made with a sugar substitute that contains lactose. Toffee and butterscotch. Milk, white, or dark chocolate that contains milk. Fudge. Caramel. Fats and Oils Margarines and salad dressings that contain milk or cheese. Cream. Half and half. Cream cheese. Sour cream. Chip dips made with sour cream or yogurt. The items listed above may not be a complete list of foods and beverages to avoid. Contact your dietitian for more information. Am I getting enough calcium? Calcium is found in many foods that contain lactose and is important for bone health. The amount of calcium you need depends on your age:  Adults younger than 50 years:  1000 mg of calcium a day.  Adults older than 50 years: 1200 mg of calcium a day.  If you are not getting enough calcium, other calcium sources include:  Orange juice with calcium added. There are 300-350 mg of calcium in 1 cup of orange juice.  Sardines with edible bones. There are 325 mg  of calcium in 3 oz of sardines.  Calcium-fortified soy milk. There are 300-400 mg of calcium in 1 cup of calcium-fortified soy milk.  Calcium-fortified rice or almond milk. There are 300 mg of calcium in 1 cup of calcium-fortified rice or almond milk.  Canned salmon with edible bones. There are 180 mg of calcium in 3 oz of canned salmon with edible bones.  Calcium-fortified breakfast cereals. There are [phone] mg of calcium in calcium-fortified breakfast cereals.  Tofu set with calcium sulfate. There are 250 mg of calcium in  cup of tofu set with calcium sulfate.  Spinach, cooked. There are 145 mg of calcium in  cup of cooked spinach.  Edamame, cooked. There are 130 mg of calcium in  cup of cooked edamame.  Collard greens, cooked. There are 125 mg of calcium in  cup of cooked collard greens.  Kale, frozen or cooked. There are 90 mg of calcium in  cup of cooked or frozen kale.  Almonds. There are 95 mg of calcium in  cup of almonds.  Broccoli, cooked. There are 60 mg of calcium in 1 cup of cooked broccoli.  This information is not intended to replace advice given to you by your health care provider. Make sure you discuss any questions you have with your health care provider. Document Released: 09/03/2001 Document Revised: 08/20/2015 Document Reviewed: 06/14/2013 Elsevier Interactive Patient Education  [employer].  If you are age 44 or older, your body mass index should be between 23-30. Your Body mass index is 30.36 kg/m. If this is out of the aforementioned range listed, please consider follow up with your Primary Care Provider.  If you are age 21 or younger, your body mass index should be between 19-25. Your Body mass index is 30.36 kg/m. If this is out of the aformentioned range listed, please consider follow up with your Primary Care Provider.    Thank you for choosing [facility]  Karleen Hampshire Nandigam,MD

## 2017-12-13 NOTE — Progress Notes (Signed)
Matthew [name]    [id number]    [birth year]  Primary Care Physician:Danford, Berna Spare, NP  Referring Physician: Esaw Grandchild, NP [place], [place] [address]  Chief complaint: Diarrhea HPI: 57 year old male with history of hypertension here for new patient visit with complaints of diarrhea.  Patient reports worsening diarrhea since he was hospitalized in January with acute bronchitis and pneumonia.  He quit smoking after the hospitalization and feels that the diarrhea is worse since then.  Denies any change in diet.  No nocturnal symptoms.  He mostly has 2-3 loose bowel movements in the morning and he does not have any bowel movements in the afternoon or evening.  On a worse day he can have up to 5 or 6 bowel movements in the morning.  He drinks milk occasionally, eats few slices of cheese every day for snack in the afternoon.  He used to drink heavily up to 12 pack beer a day in the past.  He started drinking at age 89, he has cut back recently and mostly drinks 1 or 2 mixed drinks or glass of wine.  Denies any blood in stool, nausea, vomiting, abdominal pain, loss of appetite or weight loss.  No family history of IBD, celiac or GI malignancy. Never had colonoscopy or colorectal cancer screening.   Outpatient Encounter Medications as of 12/13/2017  Medication Sig  . fluticasone (FLONASE) 50 MCG/ACT nasal spray Place 2 sprays into both nostrils daily.  Marland Kitchen lisinopril (PRINIVIL,ZESTRIL) 10 MG tablet Take 1 tablet (10 mg total) by mouth daily. Patient needs office visit for further refills  . Vitamin D, Ergocalciferol, (DRISDOL) 50000 units CAPS capsule Take 1 capsule (50,000 Units total) by mouth every 7 (seven) days.  . [DISCONTINUED] albuterol (PROVENTIL HFA;VENTOLIN HFA) 108 (90 Base) MCG/ACT inhaler Inhale 2 puffs into the lungs every 4 (four) hours as needed for wheezing or shortness of breath.   No facility-administered encounter medications on file as of  12/13/2017.     Allergies as of 12/13/2017  . (No Known Allergies)    Past Medical History:  Diagnosis Date  . CAP (community acquired pneumonia) 03/31/2017  . COPD (chronic obstructive pulmonary disease) ([place])   . Hypertension     Past Surgical History:  Procedure Laterality Date  . HERNIA REPAIR    . HERNIA REPAIR  1966    Family History  Problem Relation Age of Onset  . Lymphoma Father   . Cancer Mother        breast    Social History   Socioeconomic History  . Marital status: Widowed    Spouse name: Not on file  . Number of children: 1  . Years of education: Not on file  . Highest education level: Not on file  Occupational History  . Not on file  Social Needs  . Financial resource strain: Not on file  . Food insecurity:    Worry: Not on file    Inability: Not on file  . Transportation needs:    Medical: Not on file    Non-medical: Not on file  Tobacco Use  . Smoking status: Former Smoker    Packs/day: 1.00    Types: Cigarettes    Last attempt to quit: 03/30/2017    Years since quitting: 0.7  . Smokeless tobacco: Never Used  Substance and Sexual Activity  . Alcohol use: Yes    Alcohol/week: 2.0 - 4.0 standard drinks  Types: 2 - 4 Standard drinks or equivalent per week    Comment: 7 cans of beer a week.  . Drug use: Yes    Frequency: 2.0 times per week    Types: Marijuana  . Sexual activity: Yes    Birth control/protection: None  Lifestyle  . Physical activity:    Days per week: Not on file    Minutes per session: Not on file  . Stress: Not on file  Relationships  . Social connections:    Talks on phone: Not on file    Gets together: Not on file    Attends religious service: Not on file    Active member of club or organization: Not on file    Attends meetings of clubs or organizations: Not on file    Relationship status: Not on file  . Intimate partner violence:    Fear of current or ex partner: Not on file    Emotionally abused: Not on  file    Physically abused: Not on file    Forced sexual activity: Not on file  Other Topics Concern  . Not on file  Social History Narrative  . Not on file      Review of systems: Review of Systems  Constitutional: Negative for fever and chills.  Positive for fatigue HENT: Negative.   Eyes: Negative for blurred vision.  Respiratory: Negative for cough, shortness of breath and wheezing.   Cardiovascular: Negative for chest pain and palpitations.  Gastrointestinal: as per HPI Genitourinary: Negative for dysuria, urgency, frequency and hematuria.  Musculoskeletal: Positive for myalgias, back pain and joint pain.  Skin: Negative for itching and rash.  Neurological: Negative for dizziness, tremors, focal weakness, seizures and loss of consciousness.  Endo/Heme/Allergies: Positive for seasonal allergies.  Psychiatric/Behavioral: Negative for depression, suicidal ideas and hallucinations.  Positive for insomnia All other systems reviewed and are negative.   Physical Exam: Vitals:   12/13/17 0920  BP: (!) 158/80  Pulse: 76   Body mass index is 30.36 kg/m. Gen:      No acute distress HEENT:  EOMI, sclera anicteric Neck:     No masses; no thyromegaly Lungs:    Clear to auscultation bilaterally; normal respiratory effort CV:         Regular rate and rhythm; no murmurs Abd:      + bowel sounds; soft, non-tender; no palpable masses, no distension Ext:    No edema; adequate peripheral perfusion Skin:      Warm and dry; no rash Neuro: alert and oriented x 3 Psych: normal mood and affect  Data Reviewed:  Reviewed labs, radiology imaging, old records and pertinent past GI work up   Assessment and Plan/Recommendations:  58 year old male with obesity, hypertension, former smoker here with complaints of intermittent diarrhea for past 7 to 8 months. Patient may have lactose intolerance, discussed lactose-free diet Will also need to exclude pancreatic insufficiency given history of  alcohol abuse in the past Check fecal fat and fecal elastase We will also check CBC, CMP, INR PT and CRP Trial of pancreatic lipase, was given samples for Zenpep to try for 2 days to see any improvement of symptoms Due for colorectal cancer screening, and will also obtain biopsies from colon to exclude microscopic colitis if continues to have persistent diarrhea The risks and benefits as well as alternatives of endoscopic procedure(s) have been discussed and reviewed. All questions answered. The patient agrees to proceed.   Damaris Hippo , MD [phone]  CC: Esaw Grandchild, NP

## 2018-01-11 ENCOUNTER — Encounter: Payer: Self-pay | Admitting: Gastroenterology

## 2018-01-19 ENCOUNTER — Encounter: Payer: Self-pay | Admitting: Gastroenterology

## 2018-01-23 ENCOUNTER — Telehealth: Payer: Self-pay

## 2018-01-23 ENCOUNTER — Other Ambulatory Visit: Payer: Self-pay

## 2018-01-23 NOTE — Telephone Encounter (Signed)
Patient reports fever and illness. He is unsure if he will be well by his colonoscopy procedure date 01/25/18. Rescheduled the colonoscopy to 02/07/18. New instructions redone and mailed to the patient.

## 2018-01-25 ENCOUNTER — Encounter: Payer: 59 | Admitting: Gastroenterology

## 2018-02-05 ENCOUNTER — Other Ambulatory Visit: Payer: 59

## 2018-02-05 DIAGNOSIS — Z1211 Encounter for screening for malignant neoplasm of colon: Secondary | ICD-10-CM

## 2018-02-05 DIAGNOSIS — R197 Diarrhea, unspecified: Secondary | ICD-10-CM

## 2018-02-07 ENCOUNTER — Ambulatory Visit (AMBULATORY_SURGERY_CENTER): Payer: 59 | Admitting: Gastroenterology

## 2018-02-07 ENCOUNTER — Other Ambulatory Visit: Payer: Self-pay

## 2018-02-07 ENCOUNTER — Encounter: Payer: Self-pay | Admitting: Gastroenterology

## 2018-02-07 VITALS — BP 148/76 | HR 83 | Temp 98.4°F | Resp 22 | Ht 62.0 in | Wt 166.0 lb

## 2018-02-07 DIAGNOSIS — R197 Diarrhea, unspecified: Secondary | ICD-10-CM

## 2018-02-07 DIAGNOSIS — D123 Benign neoplasm of transverse colon: Secondary | ICD-10-CM | POA: Diagnosis not present

## 2018-02-07 DIAGNOSIS — D128 Benign neoplasm of rectum: Secondary | ICD-10-CM

## 2018-02-07 DIAGNOSIS — D129 Benign neoplasm of anus and anal canal: Secondary | ICD-10-CM

## 2018-02-07 DIAGNOSIS — K649 Unspecified hemorrhoids: Secondary | ICD-10-CM

## 2018-02-07 DIAGNOSIS — Z1211 Encounter for screening for malignant neoplasm of colon: Secondary | ICD-10-CM

## 2018-02-07 DIAGNOSIS — K5730 Diverticulosis of large intestine without perforation or abscess without bleeding: Secondary | ICD-10-CM | POA: Diagnosis not present

## 2018-02-07 DIAGNOSIS — D120 Benign neoplasm of cecum: Secondary | ICD-10-CM

## 2018-02-07 MED ORDER — SODIUM CHLORIDE 0.9 % IV SOLN: 500.0000 mL | Freq: Once | INTRAVENOUS | Status: DC

## 2018-02-07 NOTE — Progress Notes (Signed)
No problems noted in the recovery room. maw 

## 2018-02-07 NOTE — Progress Notes (Signed)
Called to room to assist during endoscopic procedure.  Patient ID and intended procedure confirmed with present staff. Received instructions for my participation in the procedure from the performing physician.  

## 2018-02-07 NOTE — Op Note (Signed)
[facility] Patient Name: Matthew [name] Procedure Date: 02/07/2018 1:05 PM MRN: [id number] Endoscopist: Mauri Pole , MD Age: 57 Referring MD:  Date of Birth: December 31, 1960 Gender: Male Account #: [id number] Procedure:                Colonoscopy Indications:              Clinically significant diarrhea of unexplained                            origin Medicines:                Monitored Anesthesia Care Procedure:                Pre-Anesthesia Assessment:                           - Prior to the procedure, a History and Physical                            was performed, and patient medications and                            allergies were reviewed. The patient's tolerance of                            previous anesthesia was also reviewed. The risks                            and benefits of the procedure and the sedation                            options and risks were discussed with the patient.                            All questions were answered, and informed consent                            was obtained. Prior Anticoagulants: The patient has                            taken no previous anticoagulant or antiplatelet                            agents. ASA Grade Assessment: II - A patient with                            mild systemic disease. After reviewing the risks                            and benefits, the patient was deemed in                            satisfactory condition to undergo the procedure.  After obtaining informed consent, the colonoscope                            was passed under direct vision. Throughout the                            procedure, the patient's blood pressure, pulse, and                            oxygen saturations were monitored continuously. The                            Model PCF-H190DL [id number] [id number]) scope was introduced                            through the anus and advanced to the the cecum,                           identified by appendiceal orifice and ileocecal                            valve. The colonoscopy was performed without                            difficulty. The patient tolerated the procedure                            well. The quality of the bowel preparation was                            adequate. The terminal ileum, ileocecal valve,                            appendiceal orifice, and rectum were photographed. Scope In: 1:22:20 PM Scope Out: 1:40:17 PM Scope Withdrawal Time: 0 hours 12 minutes 30 seconds  Total Procedure Duration: 0 hours 17 minutes 57 seconds  Findings:                 The perianal and digital rectal examinations were                            normal.                           Two sessile polyps were found in the transverse                            colon and cecum. The polyps were 5 to 7 mm in size.                            These polyps were removed with a cold snare.                            Resection and retrieval were complete.  Two sessile polyps were found in the rectum and                            transverse colon. The polyps were 1 to 3 mm in                            size. These polyps were removed with a cold biopsy                            forceps. Resection and retrieval were complete.                           Scattered small-mouthed diverticula were found in                            the sigmoid colon and descending colon.                           Non-bleeding internal hemorrhoids were found during                            retroflexion. The hemorrhoids were medium-sized.                           Normal mucosa was found in the entire colon.                            Biopsies for histology were taken with a cold                            forceps from the right colon and left colon for                            evaluation of microscopic colitis. Complications:            No immediate  complications. Estimated Blood Loss:     Estimated blood loss was minimal. Impression:               - Two 5 to 7 mm polyps in the transverse colon and                            in the cecum, removed with a cold snare. Resected                            and retrieved.                           - Two 1 to 3 mm polyps in the rectum and in the                            transverse colon, removed with a cold biopsy  forceps. Resected and retrieved.                           - Diverticulosis in the sigmoid colon and in the                            descending colon.                           - Non-bleeding internal hemorrhoids.                           - Normal mucosa in the entire examined colon.                            Biopsied. Recommendation:           - Patient has a contact number available for                            emergencies. The signs and symptoms of potential                            delayed complications were discussed with the                            patient. Return to normal activities tomorrow.                            Written discharge instructions were provided to the                            patient.                           - Resume previous diet.                           - Continue present medications.                           - Await pathology results.                           - Repeat colonoscopy in 3 - 5 years for                            surveillance based on pathology results. Mauri Pole, MD 02/07/2018 1:50:16 PM This report has been signed electronically.

## 2018-02-07 NOTE — Progress Notes (Signed)
Report to PACU, RN, vss, BBS= Clear.  

## 2018-02-07 NOTE — Progress Notes (Signed)
Upon initial induction attempt, the meds could not be pushed thru the line.  No blood could be withdrawn and saline flush did not work.  Pt agreeable to restart IV.  One stick, top of R hand with 22g by J Alphonse Asbridge CRNA

## 2018-02-07 NOTE — Patient Instructions (Signed)
YOU HAD AN ENDOSCOPIC PROCEDURE TODAY AT [facility]:   Refer to the procedure report that was given to you for any specific questions about what was found during the examination.  If the procedure report does not answer your questions, please call your gastroenterologist to clarify.  If you requested that your care partner not be given the details of your procedure findings, then the procedure report has been included in a sealed envelope for you to review at your convenience later.  YOU SHOULD EXPECT: Some feelings of bloating in the abdomen. Passage of more gas than usual.  Walking can help get rid of the air that was put into your GI tract during the procedure and reduce the bloating. If you had a lower endoscopy (such as a colonoscopy or flexible sigmoidoscopy) you may notice spotting of blood in your stool or on the toilet paper. If you underwent a bowel prep for your procedure, you may not have a normal bowel movement for a few days.  Please Note:  You might notice some irritation and congestion in your nose or some drainage.  This is from the oxygen used during your procedure.  There is no need for concern and it should clear up in a day or so.  SYMPTOMS TO REPORT IMMEDIATELY:   Following lower endoscopy (colonoscopy or flexible sigmoidoscopy):  Excessive amounts of blood in the stool  Significant tenderness or worsening of abdominal pains  Swelling of the abdomen that is new, acute  Fever of 100F or higher   For urgent or emergent issues, a gastroenterologist can be reached at any hour by calling [phone].   DIET:  We do recommend a small meal at first, but then you may proceed to your regular diet.  Drink plenty of fluids but you should avoid alcoholic beverages for 24 hours.  ACTIVITY:  You should plan to take it easy for the rest of today and you should NOT DRIVE or use heavy machinery until tomorrow (because of the sedation medicines used during the test).     FOLLOW UP: Our staff will call the number listed on your records the next business day following your procedure to check on you and address any questions or concerns that you may have regarding the information given to you following your procedure. If we do not reach you, we will leave a message.  However, if you are feeling well and you are not experiencing any problems, there is no need to return our call.  We will assume that you have returned to your regular daily activities without incident.  If any biopsies were taken you will be contacted by phone or by letter within the next 1-3 weeks.  Please call us at [phone] if you have not heard about the biopsies in 3 weeks.    SIGNATURES/CONFIDENTIALITY: You and/or your care partner have signed paperwork which will be entered into your electronic medical record.  These signatures attest to the fact that that the information above on your After Visit Summary has been reviewed and is understood.  Full responsibility of the confidentiality of this discharge information lies with you and/or your care-partner.   Handouts were given to your care partner on polyps, diverticulosis, and hemorrhoids. You may resume your current medications today. Await biopsy results. Please call if any questions or concerns.

## 2018-02-08 ENCOUNTER — Telehealth: Payer: Self-pay | Admitting: *Deleted

## 2018-02-08 NOTE — Telephone Encounter (Signed)
Patient contacted. He has taken "gas medicine" and he is starting to feel better. Encouraged to walk and move around. Pass gas freely. Call us if he acutely worsens or fails to improve.

## 2018-02-08 NOTE — Telephone Encounter (Signed)
Ok thanks 

## 2018-02-08 NOTE — Telephone Encounter (Signed)
Beth, please check if he is feeling better. Thanks

## 2018-02-08 NOTE — Telephone Encounter (Signed)
  Follow up Call-  Call back number 02/07/2018  Post procedure Call Back phone  # [phone]  Permission to leave phone message Yes  Some recent data might be hidden     Patient questions:  Do you have a fever, pain , or abdominal swelling? Yes.   Pain Score  5 *  Have you tolerated food without any problems? Yes.    Have you been able to return to your normal activities? No.  Do you have any questions about your discharge instructions: Diet   Yes.   Medications  No. Follow up visit  No.  Do you have questions or concerns about your Care? Yes.    Actions: * If pain score is 4 or above: Physician/ provider Notified : Harl Bowie, MD   Patient states that he has lower left abd pain.  States that he couldn't sleep last night although he did eat well.  Patient states that it is really annoying especially since her couldn't sleep.  I told him to stay on a bland diet for today. He is having BM's.  No fever. No nausea or vomiting.  Phone note forwarded to Dr. Silverio Decamp.

## 2018-02-11 ENCOUNTER — Other Ambulatory Visit: Payer: Self-pay

## 2018-02-11 ENCOUNTER — Emergency Department (HOSPITAL_COMMUNITY): Payer: 59

## 2018-02-11 ENCOUNTER — Inpatient Hospital Stay (HOSPITAL_COMMUNITY)
Admission: EM | Admit: 2018-02-11 | Discharge: 2018-02-14 | DRG: 392 | Disposition: A | Discharge status: Home / Self Care | Payer: 59 | Attending: Internal Medicine | Admitting: Internal Medicine

## 2018-02-11 ENCOUNTER — Encounter (HOSPITAL_COMMUNITY): Payer: Self-pay | Admitting: Obstetrics and Gynecology

## 2018-02-11 DIAGNOSIS — I10 Essential (primary) hypertension: Secondary | ICD-10-CM | POA: Diagnosis present

## 2018-02-11 DIAGNOSIS — R1032 Left lower quadrant pain: Secondary | ICD-10-CM | POA: Diagnosis not present

## 2018-02-11 DIAGNOSIS — K5732 Diverticulitis of large intestine without perforation or abscess without bleeding: Secondary | ICD-10-CM

## 2018-02-11 DIAGNOSIS — Z7951 Long term (current) use of inhaled steroids: Secondary | ICD-10-CM

## 2018-02-11 DIAGNOSIS — J449 Chronic obstructive pulmonary disease, unspecified: Secondary | ICD-10-CM | POA: Diagnosis present

## 2018-02-11 DIAGNOSIS — Z807 Family history of other malignant neoplasms of lymphoid, hematopoietic and related tissues: Secondary | ICD-10-CM

## 2018-02-11 DIAGNOSIS — K5792 Diverticulitis of intestine, part unspecified, without perforation or abscess without bleeding: Secondary | ICD-10-CM

## 2018-02-11 DIAGNOSIS — Z87891 Personal history of nicotine dependence: Secondary | ICD-10-CM

## 2018-02-11 HISTORY — DX: Diverticulitis of large intestine without perforation or abscess without bleeding: K57.32

## 2018-02-11 LAB — COMPREHENSIVE METABOLIC PANEL
ALK PHOS: 77 U/L (ref 38–126)
ALT: 20 U/L (ref 0–44)
ANION GAP: 8 (ref 5–15)
AST: 16 U/L (ref 15–41)
Albumin: 4.1 g/dL (ref 3.5–5.0)
BILIRUBIN TOTAL: 1 mg/dL (ref 0.3–1.2)
BUN: 12 mg/dL (ref 6–20)
CALCIUM: 9.2 mg/dL (ref 8.9–10.3)
CO2: 27 mmol/L (ref 22–32)
Chloride: 103 mmol/L (ref 98–111)
Creatinine, Ser: 0.8 mg/dL (ref 0.61–1.24)
GFR calc Af Amer: >60 mL/min (ref >60)
GLUCOSE: 113 mg/dL — AB (ref 70–99)
POTASSIUM: 3.9 mmol/L (ref 3.5–5.1)
Sodium: 138 mmol/L (ref 135–145)
TOTAL PROTEIN: 7 g/dL (ref 6.5–8.1)

## 2018-02-11 LAB — CBC
HCT: 43.9 % (ref 39.0–52.0)
HEMOGLOBIN: 14.8 g/dL (ref 13.0–17.0)
MCH: 31.8 pg (ref 26.0–34.0)
MCHC: 33.7 g/dL (ref 30.0–36.0)
MCV: 94.4 fL (ref 80.0–100.0)
PLATELETS: 282 10*3/uL (ref 150–400)
RBC: 4.65 MIL/uL (ref 4.22–5.81)
RDW: 12.4 % (ref 11.5–15.5)
WBC: 10.6 10*3/uL — AB (ref 4.0–10.5)
nRBC: 0 % (ref 0.0–0.2)

## 2018-02-11 LAB — URINALYSIS, ROUTINE W REFLEX MICROSCOPIC
BILIRUBIN URINE: NEGATIVE
Glucose, UA: NEGATIVE mg/dL
HGB URINE DIPSTICK: NEGATIVE
Ketones, ur: 5 mg/dL — AB
Leukocytes, UA: NEGATIVE
NITRITE: NEGATIVE
PROTEIN: NEGATIVE mg/dL
SPECIFIC GRAVITY, URINE: 1.016 (ref 1.005–1.030)
pH: 6 (ref 5.0–8.0)

## 2018-02-11 LAB — LIPASE, BLOOD: Lipase: 132 U/L — ABNORMAL HIGH (ref 11–51)

## 2018-02-11 MED ORDER — LACTATED RINGERS IV SOLN
INTRAVENOUS | Status: AC
  Administered 2018-02-11 – 2018-02-12 (×2): via INTRAVENOUS

## 2018-02-11 MED ORDER — HYDROCODONE-ACETAMINOPHEN 5-325 MG PO TABS: 1.0000 | ORAL_TABLET | Freq: Three times a day (TID) | ORAL | 0 refills | Status: DC | PRN

## 2018-02-11 MED ORDER — ACETAMINOPHEN 325 MG PO TABS: 650.0000 mg | ORAL_TABLET | Freq: Four times a day (QID) | ORAL | Status: DC | PRN

## 2018-02-11 MED ORDER — ACETAMINOPHEN 650 MG RE SUPP: 650.0000 mg | Freq: Four times a day (QID) | RECTAL | Status: DC | PRN

## 2018-02-11 MED ORDER — METRONIDAZOLE 500 MG PO TABS
500.0000 mg | ORAL_TABLET | Freq: Once | ORAL | Status: AC
  Administered 2018-02-11: 500 mg via ORAL
  Filled 2018-02-11: qty 1

## 2018-02-11 MED ORDER — MORPHINE SULFATE (PF) 2 MG/ML IV SOLN
1.0000 mg | INTRAVENOUS | Status: DC | PRN
  Administered 2018-02-12 (×2): 1 mg via INTRAVENOUS
  Filled 2018-02-11 (×2): qty 1

## 2018-02-11 MED ORDER — SODIUM CHLORIDE (PF) 0.9 % IJ SOLN
INTRAMUSCULAR | Status: AC
  Filled 2018-02-11: qty 50

## 2018-02-11 MED ORDER — SODIUM CHLORIDE 0.9 % IV BOLUS
1000.0000 mL | Freq: Once | INTRAVENOUS | Status: AC
  Administered 2018-02-11: 1000 mL via INTRAVENOUS

## 2018-02-11 MED ORDER — CIPROFLOXACIN HCL 500 MG PO TABS: 500.0000 mg | ORAL_TABLET | Freq: Two times a day (BID) | ORAL | 0 refills | Status: DC

## 2018-02-11 MED ORDER — METRONIDAZOLE 500 MG PO TABS: 500.0000 mg | ORAL_TABLET | Freq: Three times a day (TID) | ORAL | 0 refills | Status: DC

## 2018-02-11 MED ORDER — MORPHINE SULFATE (PF) 4 MG/ML IV SOLN
4.0000 mg | Freq: Once | INTRAVENOUS | Status: AC
  Administered 2018-02-11: 4 mg via INTRAVENOUS
  Filled 2018-02-11: qty 1

## 2018-02-11 MED ORDER — HYDRALAZINE HCL 20 MG/ML IJ SOLN: 5.0000 mg | INTRAMUSCULAR | Status: DC | PRN

## 2018-02-11 MED ORDER — IOPAMIDOL (ISOVUE-300) INJECTION 61%
INTRAVENOUS | Status: AC
  Filled 2018-02-11: qty 100

## 2018-02-11 MED ORDER — IOPAMIDOL (ISOVUE-300) INJECTION 61%
100.0000 mL | Freq: Once | INTRAVENOUS | Status: AC | PRN
  Administered 2018-02-11: 100 mL via INTRAVENOUS

## 2018-02-11 MED ORDER — ONDANSETRON HCL 4 MG/2ML IJ SOLN: 4.0000 mg | Freq: Four times a day (QID) | INTRAMUSCULAR | Status: DC | PRN

## 2018-02-11 MED ORDER — ONDANSETRON HCL 4 MG PO TABS: 4.0000 mg | ORAL_TABLET | Freq: Four times a day (QID) | ORAL | Status: DC | PRN

## 2018-02-11 MED ORDER — CIPROFLOXACIN HCL 500 MG PO TABS
500.0000 mg | ORAL_TABLET | Freq: Once | ORAL | Status: AC
  Administered 2018-02-11: 500 mg via ORAL
  Filled 2018-02-11: qty 1

## 2018-02-11 MED ORDER — METRONIDAZOLE IN NACL 5-0.79 MG/ML-% IV SOLN
500.0000 mg | Freq: Three times a day (TID) | INTRAVENOUS | Status: DC
  Administered 2018-02-12 – 2018-02-14 (×8): 500 mg via INTRAVENOUS
  Filled 2018-02-11 (×8): qty 100

## 2018-02-11 MED ORDER — ONDANSETRON HCL 4 MG/2ML IJ SOLN
4.0000 mg | Freq: Once | INTRAMUSCULAR | Status: AC
  Administered 2018-02-11: 4 mg via INTRAVENOUS
  Filled 2018-02-11: qty 2

## 2018-02-11 MED ORDER — KETOROLAC TROMETHAMINE 30 MG/ML IJ SOLN
30.0000 mg | Freq: Once | INTRAMUSCULAR | Status: AC
  Administered 2018-02-11: 30 mg via INTRAVENOUS
  Filled 2018-02-11: qty 1

## 2018-02-11 NOTE — ED Notes (Signed)
Pt cont to c/o nausea at this time. Will make MD aware.

## 2018-02-11 NOTE — ED Notes (Signed)
Pt stated they would attempt to give urine sample with urinal at bedside.

## 2018-02-11 NOTE — ED Notes (Signed)
Pt returned from CT °

## 2018-02-11 NOTE — ED Notes (Signed)
Pt with c/o nausea. Per MD hold D/C until nausea improved after Zofran. Recheck with MD on reassessment.

## 2018-02-11 NOTE — ED Notes (Signed)
Pt reminded of need for urine specimen. Informed pt waiting on CT scan.

## 2018-02-11 NOTE — ED Provider Notes (Addendum)
[facility] DEPT Provider Note   CSN: [id number] Arrival date & time: 02/11/18  1454     History   Chief Complaint Chief Complaint  Patient presents with  . Abdominal Pain    HPI Matthew [name] is a 57 y.o. male possible history of COPD, hypertension who presents for evaluation of left lower quadrant abdominal pain that is been ongoing since last night.  He reports that he recently had a colonoscopy done on 02/07/2018.  He reports that afterwards, he felt a little "off" but states that last night, he started having worsening left lower quadrant abdominal pain.  He reports that since last night, the pain has progressively worsened.  He reports some nausea but no vomiting.  He states he has had some subjective fever but has not measured an actual temperature.  He states he called the GI doctor ([facility]) and was prompted to come to the emergency department for lab work, CT scan.  Patient reports that that he got a colonoscopy because he had been having some diarrhea and states that he had never had one before.  He reports that there was some polyps but no other abnormalities.  Patient denies any chest pain, difficulty breathing, blood in stool, urinary complaints.  The history is provided by the patient.    Past Medical History:  Diagnosis Date  . CAP (community acquired pneumonia) 03/31/2017  . COPD (chronic obstructive pulmonary disease) ([place])   . Hypertension     Patient Active Problem List   Diagnosis Date Noted  . Diarrhea 10/16/2017  . Healthcare maintenance 10/16/2017  . Ear build-up, left 08/10/2017  . Acute maxillary sinusitis 07/25/2017  . Left otitis media 07/17/2017  . Hospital discharge follow-up 04/12/2017  . Hypertension 04/12/2017  . CAP (community acquired pneumonia) 03/30/2017  . Acute respiratory failure with hypoxia ([place]) 03/30/2017  . Acute bronchitis 03/30/2017  . Acute otitis media, right 05/05/2016  . Pharyngitis  05/05/2016    Past Surgical History:  Procedure Laterality Date  . HERNIA REPAIR    . [place] Medications    Prior to Admission medications   Medication Sig Start Date End Date Taking? Authorizing Provider  ibuprofen (ADVIL,MOTRIN) 200 MG tablet Take 600 mg by mouth every 6 (six) hours as needed for headache, mild pain or moderate pain.   Yes [provider]  lisinopril (PRINIVIL,ZESTRIL) 10 MG tablet Take 1 tablet (10 mg total) by mouth daily. Patient needs office visit for further refills 10/16/17  Yes Danford, [name] D, NP  ciprofloxacin (CIPRO) 500 MG tablet Take 1 tablet (500 mg total) by mouth every 12 (twelve) hours for 7 days. 02/11/18 02/18/18  Volanda Napoleon, PA-C  fluticasone (FLONASE) 50 MCG/ACT nasal spray Place 2 sprays into both nostrils daily. Patient not taking: Reported on 02/11/2018 07/25/17   Mina Marble D, NP  HYDROcodone-acetaminophen (NORCO/VICODIN) 5-325 MG tablet Take 1-2 tablets by mouth every 8 (eight) hours as needed. 02/11/18   Volanda Napoleon, PA-C  metroNIDAZOLE (FLAGYL) 500 MG tablet Take 1 tablet (500 mg total) by mouth 3 (three) times daily for 7 days. 02/11/18 02/18/18  Volanda Napoleon, PA-C  Vitamin D, Ergocalciferol, (DRISDOL) 50000 units CAPS capsule Take 1 capsule (50,000 Units total) by mouth every 7 (seven) days. Patient not taking: Reported on 02/11/2018 06/15/17   Esaw Grandchild, NP    Family History Family History  Problem Relation Age of Onset  .  Lymphoma Father   . Cancer Mother        breast  . Colon cancer Neg Hx   . Rectal cancer Neg Hx   . Stomach cancer Neg Hx     Social History Social History   Tobacco Use  . Smoking status: Former Smoker    Packs/day: 1.00    Types: Cigarettes    Last attempt to quit: 03/30/2017    Years since quitting: 0.8  . Smokeless tobacco: Never Used  Substance Use Topics  . Alcohol use: Not Currently    Alcohol/week: 2.0 - 4.0 standard drinks    Types: 2  - 4 Standard drinks or equivalent per week    Comment: 7 cans of beer a week.  . Drug use: Not Currently    Frequency: 2.0 times per week    Types: Marijuana     Allergies   Patient has no known allergies.   Review of Systems Review of Systems  Constitutional: Negative for fever.  Respiratory: Negative for cough and shortness of breath.   Cardiovascular: Negative for chest pain.  Gastrointestinal: Positive for abdominal pain and nausea. Negative for vomiting.  Genitourinary: Negative for dysuria and hematuria.  Neurological: Negative for headaches.  All other systems reviewed and are negative.    Physical Exam Updated Vital Signs BP (!) 145/78   Pulse 82   Temp 98.6 F (37 C) (Oral)   Resp 16   Wt 75.3 kg   SpO2 96%   BMI 30.36 kg/m   Physical Exam  Constitutional: He is oriented to person, place, and time. He appears well-developed and well-nourished.  Appears uncomfortable but no acute distress   HENT:  Head: Normocephalic and atraumatic.  Mouth/Throat: Oropharynx is clear and moist and mucous membranes are normal.  Eyes: Pupils are equal, round, and reactive to light. Conjunctivae, EOM and lids are normal.  Neck: Full passive range of motion without pain.  Cardiovascular: Normal rate, regular rhythm, normal heart sounds and normal pulses. Exam reveals no gallop and no friction rub.  No murmur heard. Pulmonary/Chest: Effort normal and breath sounds normal.  Lungs clear to auscultation bilaterally.  Symmetric chest rise.  No wheezing, rales, rhonchi.  Abdominal: Soft. Normal appearance. There is tenderness in the left upper quadrant and left lower quadrant. There is no rigidity and no guarding.  Abdomen is soft, nondistended.  Tenderness noted to the left lower quadrant and left upper quadrant.  Musculoskeletal: Normal range of motion.  Neurological: He is alert and oriented to person, place, and time.  Skin: Skin is warm and dry. Capillary refill takes less than  2 seconds.  Psychiatric: He has a normal mood and affect. His speech is normal.  Nursing note and vitals reviewed.    ED Treatments / Results  Labs (all labs ordered are listed, but only abnormal results are displayed) Labs Reviewed  LIPASE, BLOOD - Abnormal; Notable for the following components:      Result Value   Lipase 132 (*)    All other components within normal limits  COMPREHENSIVE METABOLIC PANEL - Abnormal; Notable for the following components:   Glucose, Bld 113 (*)    All other components within normal limits  CBC - Abnormal; Notable for the following components:   WBC 10.6 (*)    All other components within normal limits  URINALYSIS, ROUTINE W REFLEX MICROSCOPIC - Abnormal; Notable for the following components:   Ketones, ur 5 (*)    All other components within normal limits  EKG None  Radiology Ct Abdomen Pelvis W Contrast  Result Date: 02/11/2018 CLINICAL DATA:  Abdominal pain, nausea since colonoscopy. EXAM: CT ABDOMEN AND PELVIS WITH CONTRAST TECHNIQUE: Multidetector CT imaging of the abdomen and pelvis was performed using the standard protocol following bolus administration of intravenous contrast. CONTRAST:  116mL ISOVUE-300 IOPAMIDOL (ISOVUE-300) INJECTION 61% COMPARISON:  None. FINDINGS: Lower chest: Lung bases are clear. No effusions. Heart is normal size. Hepatobiliary: Mild fatty infiltration of the liver. Gallbladder unremarkable. Pancreas: No focal abnormality or ductal dilatation. Spleen: No focal abnormality.  Normal size. Adrenals/Urinary Tract: No adrenal abnormality. No focal renal abnormality. No stones or hydronephrosis. Urinary bladder is unremarkable. Stomach/Bowel: Sigmoid diverticulosis. Inflammatory stranding around the sigmoid colon compatible with active diverticulitis. Stomach and small bowel decompressed. Appendix normal. Vascular/Lymphatic: Aortic atherosclerosis. No enlarged abdominal or pelvic lymph nodes. Reproductive: Mild prominence of  the prostate. Other: No free fluid or free air. Musculoskeletal: No acute bony abnormality. IMPRESSION: Sigmoid diverticulosis. Inflammatory stranding adjacent to the sigmoid colon compatible with active diverticulitis. No evidence of perforation. Early fatty infiltration of the liver. Electronically Signed   By: Rolm Baptise M.D.   On: 02/11/2018 20:38    Procedures Procedures (including critical care time)  Medications Ordered in ED Medications  iopamidol (ISOVUE-300) 61 % injection (has no administration in time range)  sodium chloride (PF) 0.9 % injection (has no administration in time range)  ketorolac (TORADOL) 30 MG/ML injection 30 mg (has no administration in time range)  ciprofloxacin (CIPRO) tablet 500 mg (has no administration in time range)  metroNIDAZOLE (FLAGYL) tablet 500 mg (has no administration in time range)  sodium chloride 0.9 % bolus 1,000 mL (0 mLs Intravenous Stopped 02/11/18 1927)  morphine 4 MG/ML injection 4 mg (4 mg Intravenous Given 02/11/18 1815)  iopamidol (ISOVUE-300) 61 % injection 100 mL (100 mLs Intravenous Contrast Given 02/11/18 2021)     Initial Impression / Assessment and Plan / ED Course  I have reviewed the triage vital signs and the nursing notes.  Pertinent labs & imaging results that were available during my care of the patient were reviewed by me and considered in my medical decision making (see chart for details).     57 year old male who presents for evaluation of left lower quadrant abdominal pain.  Recent colonoscopy on 02/07/2018.  Associate with nausea.  Reports subjective fevers but not actually measured temperature.  Called the GI doctor and was told to come to the ED for further evaluation. Patient is afebrile, non-toxic appearing, sitting comfortably on examination table. Vital signs reviewed and stable.  On exam, he has tenderness noted to the left upper quadrant and left lower quadrant.  Consider infectious etiology versus pain  associated from colonoscopy.  Initial labs ordered at triage.    CMP is unremarkable.  CBC shows slight leukocytosis of 10.6.  Lipase is elevated at 132.  No priors for comparison.  Re-evaluation.  He reports some improvement in pain after analgesics.  CT abdomen pelvis shows inflammatory stranding adjacent to the colon compatible with active diverticulitis.  No evidence of perforation.  Discussed results with patient.  He reports improvement in pain after analgesics.  He states is still sore but states it is better than when he first came in.  He has been able to tolerate p.o. in the department any difficulty.  Vital signs are stable.  I discussed with him regarding management of diverticulitis.  He has no known drug allergies.  Patient feels comfortable managing this at home.  I encouraged at home supportive care measures.  Instructed patient to follow-up with GI for further evaluation. Patient had ample opportunity for questions and discussion. All patient's questions were answered with full understanding. Strict return precautions discussed. Patient expresses understanding and agreement to plan.   Patient was set for discharge home.  RN informed me that after he took the Flagyl and Cipro here in the ED, he had some worsening pain and nausea and one-time episodes of vomiting.  He was given Zofran but continues to feel nauseous and has worsening pain in the left lower quadrant.  Reevaluation shows tenderness noted left lower quadrant.  Will consult hospitalist for admission.   Final Clinical Impressions(s) / ED Diagnoses   Final diagnoses:  Diverticulitis    ED Discharge Orders         Ordered    metroNIDAZOLE (FLAGYL) 500 MG tablet  3 times daily     02/11/18 2103    ciprofloxacin (CIPRO) 500 MG tablet  Every 12 hours     02/11/18 2103    HYDROcodone-acetaminophen (NORCO/VICODIN) 5-325 MG tablet  Every 8 hours PRN     02/11/18 2106           Volanda Napoleon, PA-C 02/11/18  2215    Lennice Sites, DO 02/11/18 2225    Volanda Napoleon, PA-C 02/11/18 2257    Lennice Sites, DO 02/11/18 2349

## 2018-02-11 NOTE — Discharge Instructions (Signed)
You can take 1000 mg of Tylenol.  Do not exceed 4000 mg of Tylenol a day.  Take pain medications as directed for break through pain. Do not drive or operate machinery while taking this medication.   Take antibiotics as directed. Please take all of your antibiotics until finished.  As we discussed, limit your diet to soft, clear foods to help with your symptoms.  Follow-up with your referred GI doctor.  Return the emergency department for any fever, persistent vomiting, worsening pain or any other worsening or concerning symptoms.

## 2018-02-11 NOTE — ED Triage Notes (Signed)
Pt reports he recently had a colonoscopy and had 4 polyps removed. Pt reports he has been extra tender and has had a lot of pain.  Pt reports he has not been able to eat a full meal since the colonoscopy and has felt nausea but has had no emesis.

## 2018-02-11 NOTE — ED Notes (Signed)
ED PA at bedside at this time.

## 2018-02-11 NOTE — ED Notes (Signed)
Pt reminded of need for urine 

## 2018-02-11 NOTE — ED Notes (Signed)
Hospitalist consult ordered at this time.

## 2018-02-11 NOTE — Telephone Encounter (Signed)
Patient called today Had colonoscopy in 02/07/2018-colonoscopy revealed small colonic polyps, diverticulosis and random colonic biopsies were taken. Since colonoscopy started having more abdominal pain which is getting worse today. He has some subjective fever, tenderness in the left lower quadrant on touch and does not feel good. Told him that he needs to come over to the emergency room at [facility] with the intent to get blood work and possibly CT scan.  He told me that he would be he had an hour.

## 2018-02-11 NOTE — Progress Notes (Signed)
Pharmacy Antibiotic Note  Matthew [name] is a 57 y.o. male admitted on 02/11/2018 with intra-abdominal infection.  Pharmacy has been consulted for cipro dosing.  Plan: Cipro 400 mg IV q12h Flagyl 500 mg IV q8h (MD) F/u scr/cultures  Weight: 166 lb (75.3 kg)  Temp (24hrs), Avg:98.2 F (36.8 C), Min:97.7 F (36.5 C), Max:98.6 F (37 C)  Recent Labs  Lab 02/11/18 1527  WBC 10.6*  CREATININE 0.80    Estimated Creatinine Clearance: 90.6 mL/min (by C-G formula based on SCr of 0.8 mg/dL).    No Known Allergies  Antimicrobials this admission: 11/17 cipro >>  11/17 flagyl >>   Dose adjustments this admission:   Microbiology results:  BCx:   UCx:    Sputum:    MRSA PCR:   Thank you for allowing pharmacy to be a part of this patient's care.  Dorrene German 02/11/2018 11:50 PM

## 2018-02-11 NOTE — ED Notes (Signed)
Pt given urinal and made aware of need for urine specimen 

## 2018-02-11 NOTE — ED Notes (Signed)
Report called to 5W. Pt going to 1527.

## 2018-02-11 NOTE — ED Notes (Signed)
Pt informed of plans to admit under observation status.

## 2018-02-11 NOTE — H&P (Signed)
History and Physical    Matthew [name] DOB: [birth year] DOA: 02/11/2018  PCP: Esaw Grandchild, NP  Patient coming from: Home.  Chief Complaint: Abdominal pain.  HPI: Matthew [name] is a 57 y.o. male with history of hypertension had a colonoscopy done on the 13th 2019 by Dr. Silverio Decamp.  During which some polyps were removed and also was observed to have diverticulosis of the sigmoid area.  Following the colonoscopy patient states he developed some discomfort for the next 2 days and became more worse 2 days ago.  Since the pain worsened patient came to the ER.  Denies any fever chills.  Has some chronic diarrhea.  ED Course: In the ER on exam patient has tenderness of the left lower quadrant.  CT abdomen and pelvis done shows sigmoid diverticulitis.  No fever.  But patient started having nausea vomiting and pain was worsening.  So admitted for IV antibiotics and further observation.  Review of Systems: As per HPI, rest all negative.   Past Medical History:  Diagnosis Date  . CAP (community acquired pneumonia) 03/31/2017  . COPD (chronic obstructive pulmonary disease) ([place])   . Hypertension     Past Surgical History:  Procedure Laterality Date  . HERNIA REPAIR    . HERNIA REPAIR  1966     reports that he quit smoking about 10 months ago. His smoking use included cigarettes. He smoked 1.00 pack per day. He has never used smokeless tobacco. He reports that he drank about 2.0 - 4.0 standard drinks of alcohol per week. He reports that he has current or past drug history. Drug: Marijuana. Frequency: 2.00 times per week.  No Known Allergies  Family History  Problem Relation Age of Onset  . Lymphoma Father   . Cancer Mother        breast  . Colon cancer Neg Hx   . Rectal cancer Neg Hx   . Stomach cancer Neg Hx     Prior to Admission medications   Medication Sig Start Date End Date Taking? Authorizing Provider  ibuprofen (ADVIL,MOTRIN) 200 MG tablet Take 600 mg by  mouth every 6 (six) hours as needed for headache, mild pain or moderate pain.   Yes [provider]  lisinopril (PRINIVIL,ZESTRIL) 10 MG tablet Take 1 tablet (10 mg total) by mouth daily. Patient needs office visit for further refills 10/16/17  Yes Danford, [name] D, NP  ciprofloxacin (CIPRO) 500 MG tablet Take 1 tablet (500 mg total) by mouth every 12 (twelve) hours for 7 days. 02/11/18 02/18/18  Volanda Napoleon, PA-C  fluticasone (FLONASE) 50 MCG/ACT nasal spray Place 2 sprays into both nostrils daily. Patient not taking: Reported on 02/11/2018 07/25/17   Mina Marble D, NP  HYDROcodone-acetaminophen (NORCO/VICODIN) 5-325 MG tablet Take 1-2 tablets by mouth every 8 (eight) hours as needed. 02/11/18   Volanda Napoleon, PA-C  metroNIDAZOLE (FLAGYL) 500 MG tablet Take 1 tablet (500 mg total) by mouth 3 (three) times daily for 7 days. 02/11/18 02/18/18  Volanda Napoleon, PA-C  Vitamin D, Ergocalciferol, (DRISDOL) 50000 units CAPS capsule Take 1 capsule (50,000 Units total) by mouth every 7 (seven) days. Patient not taking: Reported on 02/11/2018 06/15/17   Mina Marble D, NP    Physical Exam: Vitals:   02/11/18 2155 02/11/18 2200 02/11/18 2230 02/11/18 2252  BP:  (!) 150/80 129/76   Pulse: 91 82 84 83  Resp:   18   Temp:      TempSrc:  SpO2: 97% 95% 97% 96%  Weight:          Constitutional: Moderately built and nourished. Vitals:   02/11/18 2155 02/11/18 2200 02/11/18 2230 02/11/18 2252  BP:  (!) 150/80 129/76   Pulse: 91 82 84 83  Resp:   18   Temp:      TempSrc:      SpO2: 97% 95% 97% 96%  Weight:       Eyes: Anicteric no pallor. ENMT: No discharge from the ears eyes nose or mouth. Neck: No mass felt.  No neck rigidity.  No JVD appreciated. Respiratory: No rhonchi or crepitations. Cardiovascular: S1-S2 heard no murmurs appreciated. Abdomen: Soft tenderness in the left lower quadrant no guarding rigidity. Musculoskeletal: No edema. Skin: No rash. Neurologic:  Alert awake oriented to time place and person.  Moves all extremities. Psychiatric: Appears normal per normal affect.   Labs on Admission: I have personally reviewed following labs and imaging studies  CBC: Recent Labs  Lab 02/11/18 1527  WBC 10.6*  HGB 14.8  HCT 43.9  MCV 94.4  PLT 409   Basic Metabolic Panel: Recent Labs  Lab 02/11/18 1527  NA 138  K 3.9  CL 103  CO2 27  GLUCOSE 113*  BUN 12  CREATININE 0.80  CALCIUM 9.2   GFR: Estimated Creatinine Clearance: 90.6 mL/min (by C-G formula based on SCr of 0.8 mg/dL). Liver Function Tests: Recent Labs  Lab 02/11/18 1527  AST 16  ALT 20  ALKPHOS 77  BILITOT 1.0  PROT 7.0  ALBUMIN 4.1   Recent Labs  Lab 02/11/18 1527  LIPASE 132*   No results for input(s): AMMONIA in the last 168 hours. Coagulation Profile: No results for input(s): INR, PROTIME in the last 168 hours. Cardiac Enzymes: No results for input(s): CKTOTAL, CKMB, CKMBINDEX, TROPONINI in the last 168 hours. BNP (last 3 results) No results for input(s): PROBNP in the last 8760 hours. HbA1C: No results for input(s): HGBA1C in the last 72 hours. CBG: No results for input(s): GLUCAP in the last 168 hours. Lipid Profile: No results for input(s): CHOL, HDL, LDLCALC, TRIG, CHOLHDL, LDLDIRECT in the last 72 hours. Thyroid Function Tests: No results for input(s): TSH, T4TOTAL, FREET4, T3FREE, THYROIDAB in the last 72 hours. Anemia Panel: No results for input(s): VITAMINB12, FOLATE, FERRITIN, TIBC, IRON, RETICCTPCT in the last 72 hours. Urine analysis:    Component Value Date/Time   COLORURINE YELLOW 02/11/2018 2002   APPEARANCEUR CLEAR 02/11/2018 2002   LABSPEC 1.016 02/11/2018 2002   PHURINE 6.0 02/11/2018 2002   GLUCOSEU NEGATIVE 02/11/2018 2002   HGBUR NEGATIVE 02/11/2018 2002   [place] NEGATIVE 02/11/2018 2002   KETONESUR 5 (A) 02/11/2018 2002   PROTEINUR NEGATIVE 02/11/2018 2002   NITRITE NEGATIVE 02/11/2018 2002   LEUKOCYTESUR  NEGATIVE 02/11/2018 2002   Sepsis Labs: @LABRCNTIP (procalcitonin:4,lacticidven:4) )No results found for this or any previous visit (from the past 240 hour(s)).   Radiological Exams on Admission: Ct Abdomen Pelvis W Contrast  Result Date: 02/11/2018 CLINICAL DATA:  Abdominal pain, nausea since colonoscopy. EXAM: CT ABDOMEN AND PELVIS WITH CONTRAST TECHNIQUE: Multidetector CT imaging of the abdomen and pelvis was performed using the standard protocol following bolus administration of intravenous contrast. CONTRAST:  142mL ISOVUE-300 IOPAMIDOL (ISOVUE-300) INJECTION 61% COMPARISON:  None. FINDINGS: Lower chest: Lung bases are clear. No effusions. Heart is normal size. Hepatobiliary: Mild fatty infiltration of the liver. Gallbladder unremarkable. Pancreas: No focal abnormality or ductal dilatation. Spleen: No focal abnormality.  Normal size. Adrenals/Urinary Tract:  No adrenal abnormality. No focal renal abnormality. No stones or hydronephrosis. Urinary bladder is unremarkable. Stomach/Bowel: Sigmoid diverticulosis. Inflammatory stranding around the sigmoid colon compatible with active diverticulitis. Stomach and small bowel decompressed. Appendix normal. Vascular/Lymphatic: Aortic atherosclerosis. No enlarged abdominal or pelvic lymph nodes. Reproductive: Mild prominence of the prostate. Other: No free fluid or free air. Musculoskeletal: No acute bony abnormality. IMPRESSION: Sigmoid diverticulosis. Inflammatory stranding adjacent to the sigmoid colon compatible with active diverticulitis. No evidence of perforation. Early fatty infiltration of the liver. Electronically Signed   By: Rolm Baptise M.D.   On: 02/11/2018 20:38     Assessment/Plan Principal Problem:   Sigmoid diverticulitis Active Problems:   Hypertension    1. Acute sigmoid diverticulitis -patient has been placed on Cipro and Flagyl and since patient has significant pain and nausea vomiting we will keep patient n.p.o. overnight.  IV  fluids pain relief medication.  I discussed with patient's gastroenterologist Dr. Silverio Decamp in the morning. 2. Elevated lipase -cause not clear.  Will recheck lipase in a.m.  Denies drinking alcohol.  CT does not show any definite impression for pancreatitis.  LFTs are normal. 3. Hypertension -since patient is n.p.o. we will keep patient on PRN IV hydralazine.   DVT prophylaxis: SCDs. Code Status: Full code. Family Communication: Discussed with patient. Disposition Plan: Home. Consults called: None. Admission status: Observation.   Rise Patience MD [facility] Pager [phone].  If 7PM-7AM, please contact night-coverage [email or web address] Password [email or web address]  02/11/2018, 11:44 PM

## 2018-02-11 NOTE — ED Notes (Signed)
Pt transported to CT ?

## 2018-02-11 NOTE — ED Notes (Signed)
New orders for pt to be admitted under OBS status.

## 2018-02-12 DIAGNOSIS — K5732 Diverticulitis of large intestine without perforation or abscess without bleeding: Secondary | ICD-10-CM | POA: Diagnosis present

## 2018-02-12 DIAGNOSIS — K5792 Diverticulitis of intestine, part unspecified, without perforation or abscess without bleeding: Secondary | ICD-10-CM

## 2018-02-12 DIAGNOSIS — I10 Essential (primary) hypertension: Secondary | ICD-10-CM

## 2018-02-12 DIAGNOSIS — R1032 Left lower quadrant pain: Secondary | ICD-10-CM | POA: Diagnosis present

## 2018-02-12 DIAGNOSIS — Z7951 Long term (current) use of inhaled steroids: Secondary | ICD-10-CM | POA: Diagnosis not present

## 2018-02-12 DIAGNOSIS — Z807 Family history of other malignant neoplasms of lymphoid, hematopoietic and related tissues: Secondary | ICD-10-CM | POA: Diagnosis not present

## 2018-02-12 DIAGNOSIS — J449 Chronic obstructive pulmonary disease, unspecified: Secondary | ICD-10-CM | POA: Diagnosis present

## 2018-02-12 DIAGNOSIS — Z87891 Personal history of nicotine dependence: Secondary | ICD-10-CM | POA: Diagnosis not present

## 2018-02-12 LAB — BASIC METABOLIC PANEL
ANION GAP: 6 (ref 5–15)
BUN: 14 mg/dL (ref 6–20)
CALCIUM: 8.6 mg/dL — AB (ref 8.9–10.3)
CO2: 29 mmol/L (ref 22–32)
Chloride: 104 mmol/L (ref 98–111)
Creatinine, Ser: 0.96 mg/dL (ref 0.61–1.24)
GFR calc Af Amer: >60 mL/min (ref >60)
GLUCOSE: 108 mg/dL — AB (ref 70–99)
POTASSIUM: 4.5 mmol/L (ref 3.5–5.1)
Sodium: 139 mmol/L (ref 135–145)

## 2018-02-12 LAB — HEPATIC FUNCTION PANEL
ALT: 15 U/L (ref 0–44)
AST: 15 U/L (ref 15–41)
Albumin: 3.6 g/dL (ref 3.5–5.0)
Alkaline Phosphatase: 69 U/L (ref 38–126)
BILIRUBIN DIRECT: 0.2 mg/dL (ref 0.0–0.2)
BILIRUBIN INDIRECT: 0.7 mg/dL (ref 0.3–0.9)
TOTAL PROTEIN: 6.2 g/dL — AB (ref 6.5–8.1)
Total Bilirubin: 0.9 mg/dL (ref 0.3–1.2)

## 2018-02-12 LAB — CBC WITH DIFFERENTIAL/PLATELET
Abs Immature Granulocytes: 0.02 10*3/uL (ref 0.00–0.07)
BASOS ABS: 0.1 10*3/uL (ref 0.0–0.1)
BASOS PCT: 1 %
EOS ABS: 0.2 10*3/uL (ref 0.0–0.5)
Eosinophils Relative: 2 %
HCT: 41.5 % (ref 39.0–52.0)
Hemoglobin: 13.4 g/dL (ref 13.0–17.0)
IMMATURE GRANULOCYTES: 0 %
Lymphocytes Relative: 23 %
Lymphs Abs: 1.7 10*3/uL (ref 0.7–4.0)
MCH: 30.7 pg (ref 26.0–34.0)
MCHC: 32.3 g/dL (ref 30.0–36.0)
MCV: 95.2 fL (ref 80.0–100.0)
Monocytes Absolute: 1.1 10*3/uL — ABNORMAL HIGH (ref 0.1–1.0)
Monocytes Relative: 15 %
NEUTROS PCT: 59 %
Neutro Abs: 4.4 10*3/uL (ref 1.7–7.7)
PLATELETS: 266 10*3/uL (ref 150–400)
RBC: 4.36 MIL/uL (ref 4.22–5.81)
RDW: 12.4 % (ref 11.5–15.5)
WBC: 7.5 10*3/uL (ref 4.0–10.5)
nRBC: 0 % (ref 0.0–0.2)

## 2018-02-12 LAB — LIPASE, BLOOD: LIPASE: 64 U/L — AB (ref 11–51)

## 2018-02-12 MED ORDER — LISINOPRIL 10 MG PO TABS
10.0000 mg | ORAL_TABLET | Freq: Every day | ORAL | Status: DC
  Administered 2018-02-12 – 2018-02-14 (×3): 10 mg via ORAL
  Filled 2018-02-12 (×3): qty 1

## 2018-02-12 MED ORDER — CIPROFLOXACIN IN D5W 400 MG/200ML IV SOLN
400.0000 mg | Freq: Two times a day (BID) | INTRAVENOUS | Status: DC
  Administered 2018-02-12 – 2018-02-14 (×5): 400 mg via INTRAVENOUS
  Filled 2018-02-12 (×5): qty 200

## 2018-02-12 NOTE — Progress Notes (Signed)
PROGRESS NOTE    Matthew [name]  DGL:[id number] DOB: 12-Jul-1960 DOA: 02/11/2018 PCP: Esaw Grandchild, NP    Brief Narrative:   Matthew [name] is a 57 y.o. male with history of hypertension had a colonoscopy done on the 13th 2019 by Dr. Silverio Decamp.  During which some polyps were removed and also was observed to have diverticulosis of the sigmoid area.  Following the colonoscopy patient states he developed some discomfort for the next 2 days and became more worse 2 days ago.  Since the pain worsened patient came to the ER.  Denies any fever chills.  Has some chronic diarrhea.  ED Course: In the ER on exam patient has tenderness of the left lower quadrant.  CT abdomen and pelvis done shows sigmoid diverticulitis.  No fever.  But patient started having nausea vomiting and pain was worsening.  So admitted for IV antibiotics and further observation.   Assessment & Plan:   Principal Problem:   Sigmoid diverticulitis Active Problems:   Hypertension  1 acute sigmoid diverticulitis Questionable etiology.  Patient with recent colonoscopy done.  CT abdomen and pelvis negative for perforation.  Patient with abdominal pain nausea.  No further emesis.  Continue IV fluids.  Start clear liquids.  Continue empiric IV ciprofloxacin and IV Flagyl.  2.  Elevated lipase Cause unclear.  Repeat lipase levels decreasing.  Patient denies any epigastric pain.  Patient denies any alcohol intake.  CT abdomen and pelvis negative for acute pancreatitis.  Follow.  3.  Hypertension Blood pressure was stable early on this morning.  Resume home regimen of lisinopril.   DVT prophylaxis: SCDs Code Status: Full Family Communication: Updated patient.  No family at bedside. Disposition Plan: Home once abdominal pain has improved, tolerating a regular diet, tolerating oral antibiotics.   Consultants:   Curb sided GI.   Procedures:   CT abdomen and pelvis 02/11/2018  Antimicrobials:   IV ciprofloxacin  02/11/2018  IV Flagyl 02/11/2018   Subjective: Patient laying in bed.  Patient still with left lower quadrant abdominal pain.  No nausea or emesis.  Asking whether he can eat.  Objective: Vitals:   02/11/18 2252 02/11/18 2349 02/11/18 2352 02/12/18 0443  BP:  136/80  131/79  Pulse: 83 73  71  Resp:  18  20  Temp:  97.7 F (36.5 C)  98 F (36.7 C)  TempSrc:  Oral  Oral  SpO2: 96% 98%  97%  Weight:   73.8 kg   Height:   5\' 3"  (1.6 m)     Intake/Output Summary (Last 24 hours) at 02/12/2018 1330 Last data filed at 02/12/2018 1200 Gross per 24 hour  Intake 2323.98 ml  Output 625 ml  Net 1698.98 ml   Filed Weights   02/11/18 1502 02/11/18 2352  Weight: 75.3 kg 73.8 kg    Examination:  General exam: Appears calm and comfortable  Respiratory system: Clear to auscultation. Respiratory effort normal. Cardiovascular system: S1 & S2 heard, RRR. No JVD, murmurs, rubs, gallops or clicks. No pedal edema. Gastrointestinal system: Abdomen is nondistended, soft and tender to palpation the left lower quadrant.  Positive bowel sounds.  No rebound.  No guarding.  Central nervous system: Alert and oriented. No focal neurological deficits. Extremities: Symmetric 5 x 5 power. Skin: No rashes, lesions or ulcers Psychiatry: Judgement and insight appear normal. Mood & affect appropriate.     Data Reviewed: I have personally reviewed following labs and imaging studies  CBC: Recent Labs  Lab 02/11/18  1527 02/12/18 0417  WBC 10.6* 7.5  NEUTROABS  --  4.4  HGB 14.8 13.4  HCT 43.9 41.5  MCV 94.4 95.2  PLT 282 720   Basic Metabolic Panel: Recent Labs  Lab 02/11/18 1527 02/12/18 0417  NA 138 139  K 3.9 4.5  CL 103 104  CO2 27 29  GLUCOSE 113* 108*  BUN 12 14  CREATININE 0.80 0.96  CALCIUM 9.2 8.6*   GFR: Estimated Creatinine Clearance: 76.5 mL/min (by C-G formula based on SCr of 0.96 mg/dL). Liver Function Tests: Recent Labs  Lab 02/11/18 1527 02/12/18 0417  AST 16 15   ALT 20 15  ALKPHOS 77 69  BILITOT 1.0 0.9  PROT 7.0 6.2*  ALBUMIN 4.1 3.6   Recent Labs  Lab 02/11/18 1527 02/12/18 0417  LIPASE 132* 64*   No results for input(s): AMMONIA in the last 168 hours. Coagulation Profile: No results for input(s): INR, PROTIME in the last 168 hours. Cardiac Enzymes: No results for input(s): CKTOTAL, CKMB, CKMBINDEX, TROPONINI in the last 168 hours. BNP (last 3 results) No results for input(s): PROBNP in the last 8760 hours. HbA1C: No results for input(s): HGBA1C in the last 72 hours. CBG: No results for input(s): GLUCAP in the last 168 hours. Lipid Profile: No results for input(s): CHOL, HDL, LDLCALC, TRIG, CHOLHDL, LDLDIRECT in the last 72 hours. Thyroid Function Tests: No results for input(s): TSH, T4TOTAL, FREET4, T3FREE, THYROIDAB in the last 72 hours. Anemia Panel: No results for input(s): VITAMINB12, FOLATE, FERRITIN, TIBC, IRON, RETICCTPCT in the last 72 hours. Sepsis Labs: No results for input(s): PROCALCITON, LATICACIDVEN in the last 168 hours.  No results found for this or any previous visit (from the past 240 hour(s)).       Radiology Studies: Ct Abdomen Pelvis W Contrast  Result Date: 02/11/2018 CLINICAL DATA:  Abdominal pain, nausea since colonoscopy. EXAM: CT ABDOMEN AND PELVIS WITH CONTRAST TECHNIQUE: Multidetector CT imaging of the abdomen and pelvis was performed using the standard protocol following bolus administration of intravenous contrast. CONTRAST:  164mL ISOVUE-300 IOPAMIDOL (ISOVUE-300) INJECTION 61% COMPARISON:  None. FINDINGS: Lower chest: Lung bases are clear. No effusions. Heart is normal size. Hepatobiliary: Mild fatty infiltration of the liver. Gallbladder unremarkable. Pancreas: No focal abnormality or ductal dilatation. Spleen: No focal abnormality.  Normal size. Adrenals/Urinary Tract: No adrenal abnormality. No focal renal abnormality. No stones or hydronephrosis. Urinary bladder is unremarkable.  Stomach/Bowel: Sigmoid diverticulosis. Inflammatory stranding around the sigmoid colon compatible with active diverticulitis. Stomach and small bowel decompressed. Appendix normal. Vascular/Lymphatic: Aortic atherosclerosis. No enlarged abdominal or pelvic lymph nodes. Reproductive: Mild prominence of the prostate. Other: No free fluid or free air. Musculoskeletal: No acute bony abnormality. IMPRESSION: Sigmoid diverticulosis. Inflammatory stranding adjacent to the sigmoid colon compatible with active diverticulitis. No evidence of perforation. Early fatty infiltration of the liver. Electronically Signed   By: Rolm Baptise M.D.   On: 02/11/2018 20:38        Scheduled Meds: Continuous Infusions: . ciprofloxacin 200 mL/hr at 02/12/18 0639  . lactated ringers 100 mL/hr at 02/12/18 1211  . metronidazole 500 mg (02/12/18 1317)     LOS: 0 days    Time spent: 35 minutes    Irine Seal, MD [facility] Pager [phone]  If 7PM-7AM, please contact night-coverage [email or web address] Password [email or web address] 02/12/2018, 1:30 PM

## 2018-02-12 NOTE — Progress Notes (Addendum)
Called by hospitalist regarding patient's current admission.  Patient is known to our practice. He underwent colonoscopy by Dr. Silverio Decamp on the 13th of this month for evaluation of diarrhea.  Four small polyps were removed, 2 by cold biopsy and 2 by cold snare, and random cold biopsies were obtained to further evaluate diarrhea.  Additional findings of medium-sized internal hemorrhoids and left colon diverticulosis.   Patient is currently admitted with uncomplicated sigmoid diverticulitis.  No evidence for perforation on CT scan.  Case discussed with the Hospitalist.  Agree with current treatment including Cipro, metronidazole, and clear liquids for uncomplicated acute diverticulitis. We are available if additional support is needed. Please call for questions or problems. If patient doesn't improve with above measures please contact us.   Tye Savoy, NP [facility]

## 2018-02-13 LAB — CBC WITH DIFFERENTIAL/PLATELET
ABS IMMATURE GRANULOCYTES: 0.04 10*3/uL (ref 0.00–0.07)
BASOS PCT: 1 %
Basophils Absolute: 0.1 10*3/uL (ref 0.0–0.1)
Eosinophils Absolute: 0.1 10*3/uL (ref 0.0–0.5)
Eosinophils Relative: 2 %
HCT: 42.7 % (ref 39.0–52.0)
Hemoglobin: 13.7 g/dL (ref 13.0–17.0)
IMMATURE GRANULOCYTES: 1 %
LYMPHS PCT: 21 %
Lymphs Abs: 1.4 10*3/uL (ref 0.7–4.0)
MCH: 30.9 pg (ref 26.0–34.0)
MCHC: 32.1 g/dL (ref 30.0–36.0)
MCV: 96.4 fL (ref 80.0–100.0)
MONOS PCT: 14 %
Monocytes Absolute: 0.9 10*3/uL (ref 0.1–1.0)
NEUTROS PCT: 61 %
Neutro Abs: 4.2 10*3/uL (ref 1.7–7.7)
PLATELETS: 271 10*3/uL (ref 150–400)
RBC: 4.43 MIL/uL (ref 4.22–5.81)
RDW: 12.4 % (ref 11.5–15.5)
WBC: 6.7 10*3/uL (ref 4.0–10.5)
nRBC: 0 % (ref 0.0–0.2)

## 2018-02-13 LAB — COMPREHENSIVE METABOLIC PANEL
ALT: 16 U/L (ref 0–44)
AST: 16 U/L (ref 15–41)
Albumin: 3.6 g/dL (ref 3.5–5.0)
Alkaline Phosphatase: 64 U/L (ref 38–126)
Anion gap: 7 (ref 5–15)
BUN: 9 mg/dL (ref 6–20)
CHLORIDE: 107 mmol/L (ref 98–111)
CO2: 28 mmol/L (ref 22–32)
Calcium: 8.8 mg/dL — ABNORMAL LOW (ref 8.9–10.3)
Creatinine, Ser: 0.89 mg/dL (ref 0.61–1.24)
GLUCOSE: 108 mg/dL — AB (ref 70–99)
Potassium: 4.2 mmol/L (ref 3.5–5.1)
SODIUM: 142 mmol/L (ref 135–145)
Total Bilirubin: 1.1 mg/dL (ref 0.3–1.2)
Total Protein: 5.9 g/dL — ABNORMAL LOW (ref 6.5–8.1)

## 2018-02-13 LAB — MAGNESIUM: MAGNESIUM: 2.2 mg/dL (ref 1.7–2.4)

## 2018-02-13 MED ORDER — OXYCODONE HCL 5 MG PO TABS
5.0000 mg | ORAL_TABLET | ORAL | Status: DC | PRN
  Administered 2018-02-13 (×2): 5 mg via ORAL
  Filled 2018-02-13 (×2): qty 1

## 2018-02-13 NOTE — Progress Notes (Signed)
PROGRESS NOTE    Matthew [name]  NFA:[id number] DOB: Jul 26, 1960 DOA: 02/11/2018 PCP: Esaw Grandchild, NP    Brief Narrative:   Matthew [name] is a 57 y.o. male with history of hypertension had a colonoscopy done on the 13th 2019 by Dr. Silverio Decamp.  During which some polyps were removed and also was observed to have diverticulosis of the sigmoid area.  Following the colonoscopy patient states he developed some discomfort for the next 2 days and became more worse 2 days ago.  Since the pain worsened patient came to the ER.  Denies any fever chills.  Has some chronic diarrhea.  ED Course: In the ER on exam patient has tenderness of the left lower quadrant.  CT abdomen and pelvis done shows sigmoid diverticulitis.  No fever.  But patient started having nausea vomiting and pain was worsening.  So admitted for IV antibiotics and further observation.   Assessment & Plan:   Principal Problem:   Sigmoid diverticulitis Active Problems:   Hypertension   Diverticulitis  1 acute sigmoid diverticulitis Questionable etiology.  Patient with recent colonoscopy done.  CT abdomen and pelvis negative for perforation.  Patient still with left lower quadrant abdominal pain and some nausea.  Denies any emesis.  Tolerating clear liquids.  Will advance diet to a full liquid diet and a soft diet for breakfast.  Continue empiric IV ciprofloxacin and IV Flagyl.  Pain management.  Supportive care.   2.  Elevated lipase Cause unclear.  Repeat lipase levels decreasing.  Patient denies any epigastric pain.  Patient denies any alcohol intake.  CT abdomen and pelvis negative for acute pancreatitis.  Follow.  3.  Hypertension Blood pressure improved with resumption of home regimen of lisinopril.    DVT prophylaxis: SCDs Code Status: Full Family Communication: Updated patient.  No family at bedside. Disposition Plan: Home once abdominal pain has improved, tolerating a regular diet, tolerating oral  antibiotics.   Consultants:   Curb sided GI.   Procedures:   CT abdomen and pelvis 02/11/2018  Antimicrobials:   IV ciprofloxacin 02/11/2018  IV Flagyl 02/11/2018   Subjective: Patient in bed still with left lower quadrant abdominal pain.  Patient had a bout of nausea however no emesis.  Patient tolerating clear liquids.  Patient asking when he can be advanced to a solid diet.   Objective: Vitals:   02/12/18 2110 02/13/18 0508 02/13/18 0930 02/13/18 1029  BP: (!) 151/82 138/76 (!) 151/87 (!) 141/80  Pulse: 86 75 71 75  Resp: 16 16 17    Temp: 97.9 F (36.6 C) 97.8 F (36.6 C) 97.9 F (36.6 C)   TempSrc: Oral Oral Oral   SpO2: 98% 93% 96% 96%  Weight:      Height:        Intake/Output Summary (Last 24 hours) at 02/13/2018 1315 Last data filed at 02/13/2018 1000 Gross per 24 hour  Intake 1969.59 ml  Output 2050 ml  Net -80.41 ml   Filed Weights   02/11/18 1502 02/11/18 2352  Weight: 75.3 kg 73.8 kg    Examination:  General exam: NAD. Respiratory system: CTAB. Respiratory effort normal. Cardiovascular system: RRR no murmurs rubs or gallops.  No JVD.  No lower extremity edema.  Gastrointestinal system: Abdomen is soft, nontender, nondistended, tender to palpation in the left lower quadrant.  Positive bowel sounds.  No rebound.  No guarding. Central nervous system: Alert and oriented. No focal neurological deficits. Extremities: Symmetric 5 x 5 power. Skin: No rashes, lesions  or ulcers Psychiatry: Judgement and insight appear normal. Mood & affect appropriate.     Data Reviewed: I have personally reviewed following labs and imaging studies  CBC: Recent Labs  Lab 02/11/18 1527 02/12/18 0417 02/13/18 0355  WBC 10.6* 7.5 6.7  NEUTROABS  --  4.4 4.2  HGB 14.8 13.4 13.7  HCT 43.9 41.5 42.7  MCV 94.4 95.2 96.4  PLT 282 266 818   Basic Metabolic Panel: Recent Labs  Lab 02/11/18 1527 02/12/18 0417 02/13/18 0355  NA 138 139 142  K 3.9 4.5 4.2  CL  103 104 107  CO2 27 29 28   GLUCOSE 113* 108* 108*  BUN 12 14 9   CREATININE 0.80 0.96 0.89  CALCIUM 9.2 8.6* 8.8*  MG  --   --  2.2   GFR: Estimated Creatinine Clearance: 82.5 mL/min (by C-G formula based on SCr of 0.89 mg/dL). Liver Function Tests: Recent Labs  Lab 02/11/18 1527 02/12/18 0417 02/13/18 0355  AST 16 15 16   ALT 20 15 16   ALKPHOS 77 69 64  BILITOT 1.0 0.9 1.1  PROT 7.0 6.2* 5.9*  ALBUMIN 4.1 3.6 3.6   Recent Labs  Lab 02/11/18 1527 02/12/18 0417  LIPASE 132* 64*   No results for input(s): AMMONIA in the last 168 hours. Coagulation Profile: No results for input(s): INR, PROTIME in the last 168 hours. Cardiac Enzymes: No results for input(s): CKTOTAL, CKMB, CKMBINDEX, TROPONINI in the last 168 hours. BNP (last 3 results) No results for input(s): PROBNP in the last 8760 hours. HbA1C: No results for input(s): HGBA1C in the last 72 hours. CBG: No results for input(s): GLUCAP in the last 168 hours. Lipid Profile: No results for input(s): CHOL, HDL, LDLCALC, TRIG, CHOLHDL, LDLDIRECT in the last 72 hours. Thyroid Function Tests: No results for input(s): TSH, T4TOTAL, FREET4, T3FREE, THYROIDAB in the last 72 hours. Anemia Panel: No results for input(s): VITAMINB12, FOLATE, FERRITIN, TIBC, IRON, RETICCTPCT in the last 72 hours. Sepsis Labs: No results for input(s): PROCALCITON, LATICACIDVEN in the last 168 hours.  No results found for this or any previous visit (from the past 240 hour(s)).       Radiology Studies: Ct Abdomen Pelvis W Contrast  Result Date: 02/11/2018 CLINICAL DATA:  Abdominal pain, nausea since colonoscopy. EXAM: CT ABDOMEN AND PELVIS WITH CONTRAST TECHNIQUE: Multidetector CT imaging of the abdomen and pelvis was performed using the standard protocol following bolus administration of intravenous contrast. CONTRAST:  168mL ISOVUE-300 IOPAMIDOL (ISOVUE-300) INJECTION 61% COMPARISON:  None. FINDINGS: Lower chest: Lung bases are clear. No  effusions. Heart is normal size. Hepatobiliary: Mild fatty infiltration of the liver. Gallbladder unremarkable. Pancreas: No focal abnormality or ductal dilatation. Spleen: No focal abnormality.  Normal size. Adrenals/Urinary Tract: No adrenal abnormality. No focal renal abnormality. No stones or hydronephrosis. Urinary bladder is unremarkable. Stomach/Bowel: Sigmoid diverticulosis. Inflammatory stranding around the sigmoid colon compatible with active diverticulitis. Stomach and small bowel decompressed. Appendix normal. Vascular/Lymphatic: Aortic atherosclerosis. No enlarged abdominal or pelvic lymph nodes. Reproductive: Mild prominence of the prostate. Other: No free fluid or free air. Musculoskeletal: No acute bony abnormality. IMPRESSION: Sigmoid diverticulosis. Inflammatory stranding adjacent to the sigmoid colon compatible with active diverticulitis. No evidence of perforation. Early fatty infiltration of the liver. Electronically Signed   By: Rolm Baptise M.D.   On: 02/11/2018 20:38        Scheduled Meds: . lisinopril  10 mg Oral Daily   Continuous Infusions: . ciprofloxacin 400 mg (02/13/18 0916)  . metronidazole 500 mg (  02/13/18 0543)     LOS: 1 day    Time spent: 35 minutes    Irine Seal, MD [facility] Pager [phone]  If 7PM-7AM, please contact night-coverage [email or web address] Password [email or web address] 02/13/2018, 1:15 PM

## 2018-02-14 DIAGNOSIS — K5732 Diverticulitis of large intestine without perforation or abscess without bleeding: Principal | ICD-10-CM

## 2018-02-14 LAB — FECAL FAT, QUALITATIVE
Fat Qual Neutral, Stl: NORMAL
Fat Qual Total, Stl: NORMAL

## 2018-02-14 LAB — PANCREATIC ELASTASE, FECAL: Pancreatic Elastase, Fecal: >500 ug Elast./g

## 2018-02-14 MED ORDER — METRONIDAZOLE 500 MG PO TABS: 500.0000 mg | ORAL_TABLET | Freq: Three times a day (TID) | ORAL | 0 refills | Status: AC

## 2018-02-14 MED ORDER — CIPROFLOXACIN HCL 500 MG PO TABS: 500.0000 mg | ORAL_TABLET | Freq: Two times a day (BID) | ORAL | 0 refills | Status: AC

## 2018-02-14 MED ORDER — HYDROCODONE-ACETAMINOPHEN 5-325 MG PO TABS: 1.0000 | ORAL_TABLET | Freq: Three times a day (TID) | ORAL | 0 refills | Status: AC | PRN

## 2018-02-14 NOTE — Progress Notes (Addendum)
1520 Pt tolerated lunch tray and pt discharged home. RN walked pt down to lobby.

## 2018-02-14 NOTE — Discharge Summary (Signed)
Physician Discharge Summary  Matthew [name] TFT:[id number] DOB: 13-Apr-1960 DOA: 02/11/2018  PCP: Esaw Grandchild, NP  Admit date: 02/11/2018 Discharge date: 02/14/2018  Admitted From: Home  Disposition:  Home   Recommendations for Outpatient Follow-up:  1. Follow up with PCP in 1-2 weeks 2. Please obtain BMP/CBC in one week 3. Follow on resolution of diverticulitis.    Discharge Condition: stable.  CODE STATUS: full code.  Diet recommendation: Heart Healthy   Brief/Interim Summary: Brief Narrative:  Volney Reierson Ritchieis a 57 y.o.malewithhistory of hypertension had a colonoscopy done on the 13th 2019 by Dr. Silverio Decamp. During which some polyps were removed and also was observed to have diverticulosis of the sigmoid area. Following the colonoscopy patient states he developed some discomfort for the next 2 days and became more worse 2 days ago. Since the pain worsened patient came to the ER. Denies any fever chills. Has some chronic diarrhea.  ED Course:In the ER on exam patient has tenderness of the left lower quadrant. CT abdomen and pelvis done shows sigmoid diverticulitis. No fever. But patient started having nausea vomiting and pain was worsening. So admitted for IV antibiotics and further observation.   Assessment & Plan:   Principal Problem:   Sigmoid diverticulitis Active Problems:   Hypertension   Diverticulitis  1 Acute sigmoid diverticulitis Questionable etiology.  Patient with recent colonoscopy done.  CT abdomen and pelvis negative for perforation.   Treated  with  empiric IV ciprofloxacin and IV Flagyl.  Pain management.  Supportive care.  he report pain has improved. Only has pain on palpation. He was able to tolerate soft diet this am. Plan to discharge home today if he tolerates lunch.  WBC normal. Needs follow up with PCP.   2.  Elevated lipase Cause unclear.  Repeat lipase levels decreasing.  Patient denies any epigastric pain.  Patient denies  any alcohol intake.  CT abdomen and pelvis negative for acute pancreatitis.   Decreased.   3.  Hypertension Continue with lisinopril.    Discharge Diagnoses:  Principal Problem:   Sigmoid diverticulitis Active Problems:   Hypertension   Diverticulitis    Discharge Instructions  Discharge Instructions    Increase activity slowly   Complete by:  As directed      Allergies as of 02/14/2018   No Known Allergies     Medication List    STOP taking these medications   fluticasone 50 MCG/ACT nasal spray Commonly known as:  FLONASE   ibuprofen 200 MG tablet Commonly known as:  ADVIL,MOTRIN     TAKE these medications   ciprofloxacin 500 MG tablet Commonly known as:  CIPRO Take 1 tablet (500 mg total) by mouth every 12 (twelve) hours for 7 days.   HYDROcodone-acetaminophen 5-325 MG tablet Commonly known as:  NORCO/VICODIN Take 1 tablet by mouth every 8 (eight) hours as needed for up to 3 days.   lisinopril 10 MG tablet Commonly known as:  PRINIVIL,ZESTRIL Take 1 tablet (10 mg total) by mouth daily. Patient needs office visit for further refills   metroNIDAZOLE 500 MG tablet Commonly known as:  FLAGYL Take 1 tablet (500 mg total) by mouth 3 (three) times daily for 7 days.   Vitamin D (Ergocalciferol) 1.25 MG (50000 UT) Caps capsule Commonly known as:  DRISDOL Take 1 capsule (50,000 Units total) by mouth every 7 (seven) days.      Follow-up [facility].   Specialty:  Gastroenterology Contact information: [place]  [place] [address] [phone]         No Known Allergies  Consultations: None  Procedures/Studies: Ct Abdomen Pelvis W Contrast  Result Date: 02/11/2018 CLINICAL DATA:  Abdominal pain, nausea since colonoscopy. EXAM: CT ABDOMEN AND PELVIS WITH CONTRAST TECHNIQUE: Multidetector CT imaging of the abdomen and pelvis was performed using the standard protocol following bolus  administration of intravenous contrast. CONTRAST:  111mL ISOVUE-300 IOPAMIDOL (ISOVUE-300) INJECTION 61% COMPARISON:  None. FINDINGS: Lower chest: Lung bases are clear. No effusions. Heart is normal size. Hepatobiliary: Mild fatty infiltration of the liver. Gallbladder unremarkable. Pancreas: No focal abnormality or ductal dilatation. Spleen: No focal abnormality.  Normal size. Adrenals/Urinary Tract: No adrenal abnormality. No focal renal abnormality. No stones or hydronephrosis. Urinary bladder is unremarkable. Stomach/Bowel: Sigmoid diverticulosis. Inflammatory stranding around the sigmoid colon compatible with active diverticulitis. Stomach and small bowel decompressed. Appendix normal. Vascular/Lymphatic: Aortic atherosclerosis. No enlarged abdominal or pelvic lymph nodes. Reproductive: Mild prominence of the prostate. Other: No free fluid or free air. Musculoskeletal: No acute bony abnormality. IMPRESSION: Sigmoid diverticulosis. Inflammatory stranding adjacent to the sigmoid colon compatible with active diverticulitis. No evidence of perforation. Early fatty infiltration of the liver. Electronically Signed   By: Rolm Baptise M.D.   On: 02/11/2018 20:38      Subjective: Report abdominal pain has improved, pain only on palpation. He denies worsening pain after breakfast   Discharge Exam: Vitals:   02/13/18 2213 02/14/18 0538  BP: (!) 146/83 (!) 142/87  Pulse: 74 64  Resp: 14 16  Temp: 98.2 F (36.8 C) 97.7 F (36.5 C)  SpO2: 96% 96%   Vitals:   02/13/18 1451 02/13/18 1601 02/13/18 2213 02/14/18 0538  BP: (!) 156/77 132/80 (!) 146/83 (!) 142/87  Pulse: 77 77 74 64  Resp: 18 17 14 16   Temp: 97.7 F (36.5 C) 98.5 F (36.9 C) 98.2 F (36.8 C) 97.7 F (36.5 C)  TempSrc: Oral Oral Oral Oral  SpO2: 97% 98% 96% 96%  Weight:      Height:        General: Pt is alert, awake, not in acute distress Cardiovascular: RRR, S1/S2 +, no rubs, no gallops Respiratory: CTA bilaterally, no  wheezing, no rhonchi Abdominal: Soft,, ND, bowel sounds + mild tenderness on palpation.  Extremities: no edema, no cyanosis    The results of significant diagnostics from this hospitalization (including imaging, microbiology, ancillary and laboratory) are listed below for reference.     Microbiology: No results found for this or any previous visit (from the past 240 hour(s)).   Labs: BNP (last 3 results) Recent Labs    03/30/17 1335  BNP 87.5   Basic Metabolic Panel: Recent Labs  Lab 02/11/18 1527 02/12/18 0417 02/13/18 0355  NA 138 139 142  K 3.9 4.5 4.2  CL 103 104 107  CO2 27 29 28   GLUCOSE 113* 108* 108*  BUN 12 14 9   CREATININE 0.80 0.96 0.89  CALCIUM 9.2 8.6* 8.8*  MG  --   --  2.2   Liver Function Tests: Recent Labs  Lab 02/11/18 1527 02/12/18 0417 02/13/18 0355  AST 16 15 16   ALT 20 15 16   ALKPHOS 77 69 64  BILITOT 1.0 0.9 1.1  PROT 7.0 6.2* 5.9*  ALBUMIN 4.1 3.6 3.6   Recent Labs  Lab 02/11/18 1527 02/12/18 0417  LIPASE 132* 64*   No results for input(s): AMMONIA in the last 168 hours. CBC: Recent Labs  Lab 02/11/18 1527 02/12/18 0417 02/13/18 0355  WBC 10.6* 7.5 6.7  NEUTROABS  --  4.4 4.2  HGB 14.8 13.4 13.7  HCT 43.9 41.5 42.7  MCV 94.4 95.2 96.4  PLT 282 266 271   Cardiac Enzymes: No results for input(s): CKTOTAL, CKMB, CKMBINDEX, TROPONINI in the last 168 hours. BNP: Invalid input(s): POCBNP CBG: No results for input(s): GLUCAP in the last 168 hours. D-Dimer No results for input(s): DDIMER in the last 72 hours. Hgb A1c No results for input(s): HGBA1C in the last 72 hours. Lipid Profile No results for input(s): CHOL, HDL, LDLCALC, TRIG, CHOLHDL, LDLDIRECT in the last 72 hours. Thyroid function studies No results for input(s): TSH, T4TOTAL, T3FREE, THYROIDAB in the last 72 hours.  Invalid input(s): FREET3 Anemia work up No results for input(s): VITAMINB12, FOLATE, FERRITIN, TIBC, IRON, RETICCTPCT in the last 72  hours. Urinalysis    Component Value Date/Time   COLORURINE YELLOW 02/11/2018 2002   APPEARANCEUR CLEAR 02/11/2018 2002   LABSPEC 1.016 02/11/2018 2002   PHURINE 6.0 02/11/2018 2002   GLUCOSEU NEGATIVE 02/11/2018 2002   HGBUR NEGATIVE 02/11/2018 2002   [place] NEGATIVE 02/11/2018 2002   KETONESUR 5 (A) 02/11/2018 2002   PROTEINUR NEGATIVE 02/11/2018 2002   NITRITE NEGATIVE 02/11/2018 2002   LEUKOCYTESUR NEGATIVE 02/11/2018 2002   Sepsis Labs Invalid input(s): PROCALCITONIN,  WBC,  LACTICIDVEN Microbiology No results found for this or any previous visit (from the past 240 hour(s)).   Time coordinating discharge: 35 minutes   SIGNED:   Elmarie Shiley, MD  [facility] 02/14/2018, 1:28 PM Pager   If 7PM-7AM, please contact night-coverage [email or web address] Password [email or web address]

## 2018-02-19 ENCOUNTER — Ambulatory Visit (INDEPENDENT_AMBULATORY_CARE_PROVIDER_SITE_OTHER): Payer: 59

## 2018-02-19 VITALS — BP 122/62 | HR 102 | Temp 98.1°F

## 2018-02-19 DIAGNOSIS — Z23 Encounter for immunization: Secondary | ICD-10-CM | POA: Diagnosis not present

## 2018-02-19 NOTE — Progress Notes (Signed)
Pt here for influenza and Tdap vaccines.  Screening questionnaires reviewed, VISs provided to patient, and any/all patient questions answered.  Charyl Bigger, CMA

## 2018-03-14 ENCOUNTER — Encounter: Payer: Self-pay | Admitting: Gastroenterology

## 2018-03-14 ENCOUNTER — Ambulatory Visit: Payer: 59 | Admitting: Gastroenterology

## 2018-03-14 VITALS — BP 128/78 | HR 68 | Ht 63.0 in | Wt 168.4 lb

## 2018-03-14 DIAGNOSIS — Z8719 Personal history of other diseases of the digestive system: Secondary | ICD-10-CM

## 2018-03-14 DIAGNOSIS — R197 Diarrhea, unspecified: Secondary | ICD-10-CM | POA: Diagnosis not present

## 2018-03-14 MED ORDER — COLESTIPOL HCL 1 G PO TABS: 1.0000 g | ORAL_TABLET | Freq: Two times a day (BID) | ORAL | 3 refills | Status: DC

## 2018-03-14 NOTE — Progress Notes (Signed)
KELII CHITTUM    [id number]    Oct 16, 1960  Primary Care Physician:Danford, Berna Spare, NP  Referring Physician: Esaw Grandchild, NP [place], [place] [address]  Chief complaint: Diarrhea  HPI: 57 year old male here for follow-up visit with complaints of persistent diarrhea. He is having 2-3 bowel movements daily on average and some days he has up to 6 bowel movements, mostly semi-formed to soft stool.  Denies any nocturnal diarrhea.  He has fecal urgency but no incontinence.  Denies any blood in stool or mucus. Fecal fat and elastase within normal limits He did a trial of Zenpep, pancreatic lipase, does not recall if his symptoms improved or not during the trial..  Colonoscopy:  February 07, 2018: 4 sessile polyps [tubular adenomas] removed, left-sided diverticulosis and internal hemorrhoids Random colon biopsies negative for microscopic colitis  CT abdomen pelvis with contrast February 11, 2018 Sigmoid diverticulosis, inflammatory stranding adjacent to sigmoid colon compatible with acute diverticulitis with no perforation.    He was hospitalized with acute diverticulitis from November 17 to 20th, was treated with IV Cipro and Flagyl and completed a 10-day course of antibiotics on discharge.  He denies any persistent abdominal pain, nausea, vomiting, loss of appetite or weight loss   Outpatient Encounter Medications as of 03/14/2018  Medication Sig  . lisinopril (PRINIVIL,ZESTRIL) 10 MG tablet Take 1 tablet (10 mg total) by mouth daily. Patient needs office visit for further refills  . [DISCONTINUED] Vitamin D, Ergocalciferol, (DRISDOL) 50000 units CAPS capsule Take 1 capsule (50,000 Units total) by mouth every 7 (seven) days.   No facility-administered encounter medications on file as of 03/14/2018.     Allergies as of 03/14/2018  . (No Known Allergies)    Past Medical History:  Diagnosis Date  . CAP (community acquired pneumonia) 03/31/2017  .  COPD (chronic obstructive pulmonary disease) ([place])   . Hypertension     Past Surgical History:  Procedure Laterality Date  . HERNIA REPAIR    . HERNIA REPAIR  1966    Family History  Problem Relation Age of Onset  . Lymphoma Father   . Cancer Mother        breast  . Colon cancer Neg Hx   . Rectal cancer Neg Hx   . Stomach cancer Neg Hx     Social History   Socioeconomic History  . Marital status: Widowed    Spouse name: Not on file  . Number of children: 1  . Years of education: Not on file  . Highest education level: Not on file  Occupational History  . Not on file  Social Needs  . Financial resource strain: Not on file  . Food insecurity:    Worry: Not on file    Inability: Not on file  . Transportation needs:    Medical: Not on file    Non-medical: Not on file  Tobacco Use  . Smoking status: Former Smoker    Packs/day: 1.00    Types: Cigarettes    Last attempt to quit: 03/30/2017    Years since quitting: 0.9  . Smokeless tobacco: Never Used  Substance and Sexual Activity  . Alcohol use: Not Currently    Alcohol/week: 2.0 - 4.0 standard drinks    Types: 2 - 4 Standard drinks or equivalent per week    Comment: 7 cans of beer a week.  . Drug use: Not Currently    Frequency: 2.0 times per  week    Types: Marijuana  . Sexual activity: Yes    Birth control/protection: None  Lifestyle  . Physical activity:    Days per week: Not on file    Minutes per session: Not on file  . Stress: Not on file  Relationships  . Social connections:    Talks on phone: Not on file    Gets together: Not on file    Attends religious service: Not on file    Active member of club or organization: Not on file    Attends meetings of clubs or organizations: Not on file    Relationship status: Not on file  . Intimate partner violence:    Fear of current or ex partner: Not on file    Emotionally abused: Not on file    Physically abused: Not on file    Forced sexual activity: Not  on file  Other Topics Concern  . Not on file  Social History Narrative  . Not on file      Review of systems: Review of Systems  Constitutional: Negative for fever and chills.  HENT: Negative.   Eyes: Negative for blurred vision.  Respiratory: Negative for cough, shortness of breath and wheezing.   Cardiovascular: Negative for chest pain and palpitations.  Gastrointestinal: as per HPI Genitourinary: Negative for dysuria, urgency, frequency and hematuria.  Musculoskeletal: Negative for myalgias, back pain and joint pain.  Skin: Negative for itching and rash.  Neurological: Negative for dizziness, tremors, focal weakness, seizures and loss of consciousness.  Endo/Heme/Allergies: Negative for seasonal allergies.  Psychiatric/Behavioral: Negative for depression, suicidal ideas and hallucinations.  All other systems reviewed and are negative.   Physical Exam: Vitals:   03/14/18 0944  BP: 128/78  Pulse: 68   Body mass index is 29.83 kg/m. Gen:      No acute distress HEENT:  EOMI, sclera anicteric Neck:     No masses; no thyromegaly Lungs:    Clear to auscultation bilaterally; normal respiratory effort CV:         Regular rate and rhythm; no murmurs Abd:      + bowel sounds; soft, non-tender; no palpable masses, no distension Ext:    No edema; adequate peripheral perfusion Skin:      Warm and dry; no rash Neuro: alert and oriented x 3 Psych: normal mood and affect  Data Reviewed:  Reviewed labs, radiology imaging, old records and pertinent past GI work up   Assessment and Plan/Recommendations:  57 year old male with complaints of persistent diarrhea Status post sigmoid diverticulitis November 8341, uncomplicated treated with antibiotics Colon biopsies negative for microscopic colitis Lactose-free diet Start probiotic for 2 weeks, 1 capsule daily Colestid 1 g twice daily If continues to have persistent diarrhea, will do a trial of Zenpep 40,000 units 2 capsules with  meals and 1 capsule with snack Return in 4 to 6 weeks or sooner if needed   K. Denzil Magnuson , MD [phone]    CC: Esaw Grandchild, NP

## 2018-03-14 NOTE — Patient Instructions (Signed)
Take the samples of Restora we have given you today and take 1 daily for 10 days  Start Colestid 1 gm twice a day after completing the Restora  Start Zenpep after 2 weeks of Colestid  Take Zenpep 40,000 unites 2 with meals and 1 with snacks for 2 days   Thank you for choosing [facility]  Kavitha Nandigam,MD

## 2018-03-15 ENCOUNTER — Encounter: Payer: Self-pay | Admitting: Gastroenterology

## 2018-04-10 ENCOUNTER — Other Ambulatory Visit: Payer: Self-pay

## 2018-04-10 DIAGNOSIS — E559 Vitamin D deficiency, unspecified: Secondary | ICD-10-CM

## 2018-04-10 DIAGNOSIS — I10 Essential (primary) hypertension: Secondary | ICD-10-CM

## 2018-04-10 DIAGNOSIS — Z0000 Encounter for general adult medical examination without abnormal findings: Secondary | ICD-10-CM

## 2018-04-10 NOTE — Progress Notes (Signed)
Cbc

## 2018-04-11 ENCOUNTER — Other Ambulatory Visit: Payer: 59

## 2018-04-11 DIAGNOSIS — Z0000 Encounter for general adult medical examination without abnormal findings: Secondary | ICD-10-CM

## 2018-04-11 DIAGNOSIS — I10 Essential (primary) hypertension: Secondary | ICD-10-CM

## 2018-04-11 DIAGNOSIS — E559 Vitamin D deficiency, unspecified: Secondary | ICD-10-CM

## 2018-04-12 ENCOUNTER — Other Ambulatory Visit: Payer: Self-pay | Admitting: Adult Health

## 2018-04-12 LAB — COMPREHENSIVE METABOLIC PANEL
ALT: 23 IU/L (ref 0–44)
AST: 19 IU/L (ref 0–40)
Albumin/Globulin Ratio: 2.4 — ABNORMAL HIGH (ref 1.2–2.2)
Albumin: 4.3 g/dL (ref 3.5–5.5)
Alkaline Phosphatase: 62 IU/L (ref 39–117)
BUN/Creatinine Ratio: 16 (ref 9–20)
BUN: 15 mg/dL (ref 6–24)
Bilirubin Total: 0.2 mg/dL (ref 0.0–1.2)
CALCIUM: 9.1 mg/dL (ref 8.7–10.2)
CO2: 22 mmol/L (ref 20–29)
Chloride: 105 mmol/L (ref 96–106)
Creatinine, Ser: 0.94 mg/dL (ref 0.76–1.27)
GFR calc Af Amer: 104 mL/min/{1.73_m2} (ref >59)
GFR, EST NON AFRICAN AMERICAN: 90 mL/min/{1.73_m2} (ref >59)
GLUCOSE: 105 mg/dL — AB (ref 65–99)
Globulin, Total: 1.8 g/dL (ref 1.5–4.5)
Potassium: 4.8 mmol/L (ref 3.5–5.2)
Sodium: 142 mmol/L (ref 134–144)
Total Protein: 6.1 g/dL (ref 6.0–8.5)

## 2018-04-12 LAB — CBC WITH DIFFERENTIAL/PLATELET
Basophils Absolute: 0.1 10*3/uL (ref 0.0–0.2)
Basos: 1 %
EOS (ABSOLUTE): 0.2 10*3/uL (ref 0.0–0.4)
Eos: 3 %
HEMOGLOBIN: 13.5 g/dL (ref 13.0–17.7)
Hematocrit: 40.3 % (ref 37.5–51.0)
Immature Grans (Abs): 0 10*3/uL (ref 0.0–0.1)
Immature Granulocytes: 0 %
Lymphocytes Absolute: 1.4 10*3/uL (ref 0.7–3.1)
Lymphs: 22 %
MCH: 31.1 pg (ref 26.6–33.0)
MCHC: 33.5 g/dL (ref 31.5–35.7)
MCV: 93 fL (ref 79–97)
MONOCYTES: 13 %
Monocytes Absolute: 0.8 10*3/uL (ref 0.1–0.9)
NEUTROS ABS: 4 10*3/uL (ref 1.4–7.0)
Neutrophils: 61 %
Platelets: 290 10*3/uL (ref 150–450)
RBC: 4.34 x10E6/uL (ref 4.14–5.80)
RDW: 12.3 % (ref 11.6–15.4)
WBC: 6.6 10*3/uL (ref 3.4–10.8)

## 2018-04-12 LAB — LIPID PANEL
Chol/HDL Ratio: 3.4 ratio (ref 0.0–5.0)
Cholesterol, Total: 175 mg/dL (ref 100–199)
HDL: 51 mg/dL (ref >39)
LDL Calculated: 102 mg/dL — ABNORMAL HIGH (ref 0–99)
Triglycerides: 111 mg/dL (ref 0–149)
VLDL Cholesterol Cal: 22 mg/dL (ref 5–40)

## 2018-04-12 LAB — HEMOGLOBIN A1C
Est. average glucose Bld gHb Est-mCnc: 128 mg/dL
Hgb A1c MFr Bld: 6.1 % — ABNORMAL HIGH (ref 4.8–5.6)

## 2018-04-12 LAB — VITAMIN D 25 HYDROXY (VIT D DEFICIENCY, FRACTURES): Vit D, 25-Hydroxy: 20.9 ng/mL — ABNORMAL LOW (ref 30.0–100.0)

## 2018-04-12 LAB — TSH: TSH: 1.47 u[IU]/mL (ref 0.450–4.500)

## 2018-04-12 MED ORDER — VITAMIN D (ERGOCALCIFEROL) 1.25 MG (50000 UNIT) PO CAPS: 50000.0000 [IU] | ORAL_CAPSULE | ORAL | 0 refills | Status: DC

## 2018-04-16 NOTE — Progress Notes (Deleted)
   Subjective:    Patient ID: Matthew [name], male    DOB: Oct 09, 1960, 58 y.o.   MRN: [id number]  HPI:  Matthew [name] is here for CPE  Healthcare Maintenance: Colonoscopy- Immunizations-  Patient Care Team    Relationship Specialty Notifications Start End  Matthew Grandchild, NP PCP - General Family Medicine  04/11/16     Patient Active Problem List   Diagnosis Date Noted  . Diverticulitis   . Sigmoid diverticulitis 02/11/2018  . Diarrhea 10/16/2017  . Healthcare maintenance 10/16/2017  . Ear build-up, left 08/10/2017  . Acute maxillary sinusitis 07/25/2017  . Left otitis media 07/17/2017  . Hospital discharge follow-up 04/12/2017  . Hypertension 04/12/2017  . CAP (community acquired pneumonia) 03/30/2017  . Acute respiratory failure with hypoxia ([place]) 03/30/2017  . Acute bronchitis 03/30/2017  . Acute otitis media, right 05/05/2016  . Pharyngitis 05/05/2016     Past Medical History:  Diagnosis Date  . CAP (community acquired pneumonia) 03/31/2017  . COPD (chronic obstructive pulmonary disease) ([place])   . Hypertension      Past Surgical History:  Procedure Laterality Date  . HERNIA REPAIR    . HERNIA REPAIR  1966     Family History  Problem Relation Age of Onset  . Lymphoma Father   . Cancer Mother        breast  . Colon cancer Neg Hx   . Rectal cancer Neg Hx   . Stomach cancer Neg Hx      Social History   Substance and Sexual Activity  Drug Use Not Currently  . Frequency: 2.0 times per week  . Types: Marijuana     Social History   Substance and Sexual Activity  Alcohol Use Not Currently  . Alcohol/week: 2.0 - 4.0 standard drinks  . Types: 2 - 4 Standard drinks or equivalent per week   Comment: 7 cans of beer a week.     Social History   Tobacco Use  Smoking Status Former Smoker  . Packs/day: 1.00  . Types: Cigarettes  . Last attempt to quit: 03/30/2017  . Years since quitting: 1.0  Smokeless Tobacco Never Used     Outpatient Encounter  Medications as of 04/18/2018  Medication Sig  . colestipol (COLESTID) 1 g tablet Take 1 tablet (1 g total) by mouth 2 (two) times daily.  Marland Kitchen lisinopril (PRINIVIL,ZESTRIL) 10 MG tablet Take 1 tablet (10 mg total) by mouth daily. Patient needs office visit for further refills  . Vitamin D, Ergocalciferol, (DRISDOL) 1.25 MG (50000 UT) CAPS capsule Take 1 capsule (50,000 Units total) by mouth every 7 (seven) days.   No facility-administered encounter medications on file as of 04/18/2018.     Allergies: Patient has no known allergies.  There is no height or weight on file to calculate BMI.  There were no vitals taken for this visit.     Review of Systems     Objective:   Physical Exam        Assessment & Plan:  No diagnosis found.  No problem-specific Assessment & Plan notes found for this encounter.    FOLLOW-UP:  No follow-ups on file.

## 2018-04-18 ENCOUNTER — Encounter: Payer: 59 | Admitting: Adult Health

## 2018-04-24 ENCOUNTER — Ambulatory Visit: Payer: 59 | Admitting: Gastroenterology

## 2018-05-11 ENCOUNTER — Other Ambulatory Visit: Payer: Self-pay

## 2018-05-11 ENCOUNTER — Telehealth: Payer: Self-pay | Admitting: Gastroenterology

## 2018-05-11 MED ORDER — CIPROFLOXACIN HCL 500 MG PO TABS: 500.0000 mg | ORAL_TABLET | Freq: Two times a day (BID) | ORAL | 0 refills | Status: AC

## 2018-05-11 MED ORDER — METRONIDAZOLE 500 MG PO TABS: 500.0000 mg | ORAL_TABLET | Freq: Two times a day (BID) | ORAL | 0 refills | Status: AC

## 2018-05-11 NOTE — Telephone Encounter (Signed)
Please send Rx for Cipro and flagyl 500mg  BID X  7 days. Miralax 1 capful twice daily. Liquid diet. If has persistent pain, will schedule for CT abd & pelvis.

## 2018-05-11 NOTE — Telephone Encounter (Signed)
Started feeling uncomfortable yesterday in the abdomen. Last evening it became more LLQ constant pain that peaks in waves. Feels like he needs to go to move his bowels but cannot. Reports he recognizes this pain as being like the diverticulitis he had in December 2019. Denies fever, bloody diarrhea or nausea. Please advise.

## 2018-05-11 NOTE — Telephone Encounter (Signed)
Patient is instructed. Rx's to [facility]. Discussed diet and Miralax.

## 2018-05-11 NOTE — Telephone Encounter (Signed)
Pt has hx of  diverticulitis and is in pain.  Pt requested to be seen ASAP.  Please advise scheduling.

## 2018-06-06 ENCOUNTER — Other Ambulatory Visit: Payer: Self-pay

## 2018-06-06 ENCOUNTER — Ambulatory Visit: Payer: 59 | Admitting: Gastroenterology

## 2018-06-06 ENCOUNTER — Encounter: Payer: Self-pay | Admitting: Gastroenterology

## 2018-06-06 ENCOUNTER — Encounter (INDEPENDENT_AMBULATORY_CARE_PROVIDER_SITE_OTHER): Payer: Self-pay

## 2018-06-06 VITALS — BP 142/78 | HR 84 | Temp 97.6°F | Ht 65.0 in | Wt 164.2 lb

## 2018-06-06 DIAGNOSIS — Z8719 Personal history of other diseases of the digestive system: Secondary | ICD-10-CM | POA: Diagnosis not present

## 2018-06-06 DIAGNOSIS — K9089 Other intestinal malabsorption: Secondary | ICD-10-CM | POA: Diagnosis not present

## 2018-06-06 NOTE — Progress Notes (Signed)
ESIAS MORY    [id number]    1960/06/24  Primary Care Physician:Danford, Berna Spare, NP  Referring Physician: Esaw Grandchild, NP [address] [place], [place] [address]  Chief complaint: Diarrhea, history of diverticulitis  HPI:  58 year old male with history of diverticulitis, hospitalized in November 2019 and chronic diarrhea here for follow-up visit Colon biopsies negative for microscopic colitis He had a mild episode last month, was treated with course of oral antibiotics Abdominal pain has resolved Diarrhea improved with Colestid, when he is taking 1 g twice daily he is having intermittent constipation.  He stopped taking for few days and has restarted again.  If he does not take any Colestid he has 3-4 semi-formed bowel movements.  Denies any blood or mucus in stool.  No weight loss. Fecal fat and elastase within normal limits He was given samples of Zenpep to try, does not recall if he had any improvement of symptoms or not.  Colonoscopy:  February 07, 2018: 4 sessile polyps [tubular adenomas] removed, left-sided diverticulosis and internal hemorrhoids Random colon biopsies negative for microscopic colitis  CT abdomen pelvis with contrast February 11, 2018 Sigmoid diverticulosis, inflammatory stranding adjacent to sigmoid colon compatible with acute diverticulitis with no perforation.    Outpatient Encounter Medications as of 06/06/2018  Medication Sig  . colestipol (COLESTID) 1 g tablet Take 1 tablet (1 g total) by mouth 2 (two) times daily. (Patient taking differently: Take 1 g by mouth daily. )  . lisinopril (PRINIVIL,ZESTRIL) 10 MG tablet Take 1 tablet (10 mg total) by mouth daily. Patient needs office visit for further refills  . Vitamin D, Ergocalciferol, (DRISDOL) 1.25 MG (50000 UT) CAPS capsule Take 1 capsule (50,000 Units total) by mouth every 7 (seven) days.   No facility-administered encounter medications on file as of 06/06/2018.     Allergies  as of 06/06/2018  . (No Known Allergies)    Past Medical History:  Diagnosis Date  . CAP (community acquired pneumonia) 03/31/2017  . COPD (chronic obstructive pulmonary disease) ([place])   . Hypertension     Past Surgical History:  Procedure Laterality Date  . HERNIA REPAIR    . HERNIA REPAIR  1966    Family History  Problem Relation Age of Onset  . Lymphoma Father   . Cancer Mother        breast  . Colon cancer Neg Hx   . Rectal cancer Neg Hx   . Stomach cancer Neg Hx     Social History   Socioeconomic History  . Marital status: Widowed    Spouse name: Not on file  . Number of children: 1  . Years of education: Not on file  . Highest education level: Not on file  Occupational History  . Not on file  Social Needs  . Financial resource strain: Not on file  . Food insecurity:    Worry: Not on file    Inability: Not on file  . Transportation needs:    Medical: Not on file    Non-medical: Not on file  Tobacco Use  . Smoking status: Former Smoker    Packs/day: 1.00    Types: Cigarettes    Last attempt to quit: 03/30/2017    Years since quitting: 1.1  . Smokeless tobacco: Never Used  Substance and Sexual Activity  . Alcohol use: Not Currently    Alcohol/week: 2.0 - 4.0 standard drinks    Types: 2 - 4  Standard drinks or equivalent per week    Comment: 7 cans of beer a week.  . Drug use: Not Currently    Frequency: 2.0 times per week    Types: Marijuana  . Sexual activity: Yes    Birth control/protection: None  Lifestyle  . Physical activity:    Days per week: Not on file    Minutes per session: Not on file  . Stress: Not on file  Relationships  . Social connections:    Talks on phone: Not on file    Gets together: Not on file    Attends religious service: Not on file    Active member of club or organization: Not on file    Attends meetings of clubs or organizations: Not on file    Relationship status: Not on file  . Intimate partner violence:    Fear  of current or ex partner: Not on file    Emotionally abused: Not on file    Physically abused: Not on file    Forced sexual activity: Not on file  Other Topics Concern  . Not on file  Social History Narrative  . Not on file      Review of systems: Review of Systems  Constitutional: Negative for fever and chills.  Positive for lack of energy HENT: Negative.   Eyes: Negative for blurred vision.  Respiratory: Negative for cough, shortness of breath and wheezing.   Cardiovascular: Negative for chest pain and palpitations.  Gastrointestinal: as per HPI Genitourinary: Negative for dysuria, urgency, frequency and hematuria.  Musculoskeletal: Negative for myalgias, back pain and joint pain.  Skin: Negative for itching and rash.  Neurological: Negative for dizziness, tremors, focal weakness, seizures and loss of consciousness.  Endo/Heme/Allergies: Negative for seasonal allergies.  Psychiatric/Behavioral: Negative for depression, suicidal ideas and hallucinations.  All other systems reviewed and are negative.   Physical Exam: Vitals:   06/06/18 1017  BP: (!) 142/78  Pulse: 84  Temp: 97.6 F (36.4 C)   Body mass index is 27.33 kg/m. Gen:      No acute distress HEENT:  EOMI, sclera anicteric Neck:     No masses; no thyromegaly Lungs:    Clear to auscultation bilaterally; normal respiratory effort CV:         Regular rate and rhythm; no murmurs Abd:      + bowel sounds; soft, non-tender; no palpable masses, no distension Ext:    No edema; adequate peripheral perfusion Skin:      Warm and dry; no rash Neuro: alert and oriented x 3 Psych: normal mood and affect  Data Reviewed:  Reviewed labs, radiology imaging, old records and pertinent past GI work up   Assessment and Plan/Recommendations:  58 year old male with history of chronic diarrhea, status post recurrent sigmoid diverticulitis Diarrhea improving with Colestid, but he is now having intermittent episodes of  constipation We will decrease the dose of Colestid to 500 mg daily and slowly increase to 500 mg twice daily if needed We will again do a trial of pancreatic enzyme replacement, was given samples of Creon to take for 2 days and advised patient to call to report any change in symptoms Benefiber 1 teaspoon 2-3 times daily with meals Return in 3 months or sooner if needed  25 minutes was spent face-to-face with the patient. Greater than 50% of the time used for counseling as well as treatment plan and follow-up. He had multiple questions which were answered to his satisfaction  K. Denzil Magnuson ,  MD [phone]    CC: Esaw Grandchild, NP

## 2018-06-06 NOTE — Patient Instructions (Signed)
Take Creon 36,000 units  2 capsules with meals , Samples given  Take Colestid 1/2 tablet daily with breakfast start next week  Take Benefiber 1 teaspoon twice a day with meals   Increase water intake to 8-10 cups daily  Follow up in 3 months    I appreciate the  opportunity to care for you  Thank You   Harl Bowie , MD

## 2018-06-08 ENCOUNTER — Encounter: Payer: Self-pay | Admitting: Gastroenterology

## 2018-07-03 ENCOUNTER — Encounter: Payer: 59 | Admitting: Adult Health

## 2018-10-14 NOTE — Progress Notes (Deleted)
   Subjective:    Patient ID: Matthew [name], male    DOB: 12/05/1960, 58 y.o.   MRN: [id number]  HPI:  Matthew [name] is her for CPE  Needs A1c! Healthcare Maintenance: Immunizations- Colonoscopy-  Patient Care Team    Relationship Specialty Notifications Start End  Lorain, Berna Spare, NP PCP - General Family Medicine  04/11/16     Patient Active Problem List   Diagnosis Date Noted  . Diverticulitis   . Sigmoid diverticulitis 02/11/2018  . Diarrhea 10/16/2017  . Healthcare maintenance 10/16/2017  . Ear build-up, left 08/10/2017  . Acute maxillary sinusitis 07/25/2017  . Left otitis media 07/17/2017  . Hospital discharge follow-up 04/12/2017  . Hypertension 04/12/2017  . CAP (community acquired pneumonia) 03/30/2017  . Acute respiratory failure with hypoxia ([place]) 03/30/2017  . Acute bronchitis 03/30/2017  . Acute otitis media, right 05/05/2016  . Pharyngitis 05/05/2016     Past Medical History:  Diagnosis Date  . CAP (community acquired pneumonia) 03/31/2017  . COPD (chronic obstructive pulmonary disease) ([place])   . Hypertension      Past Surgical History:  Procedure Laterality Date  . HERNIA REPAIR    . HERNIA REPAIR  1966     Family History  Problem Relation Age of Onset  . Lymphoma Father   . Cancer Mother        breast  . Colon cancer Neg Hx   . Rectal cancer Neg Hx   . Stomach cancer Neg Hx      Social History   Substance and Sexual Activity  Drug Use Not Currently  . Frequency: 2.0 times per week  . Types: Marijuana     Social History   Substance and Sexual Activity  Alcohol Use Not Currently  . Alcohol/week: 2.0 - 4.0 standard drinks  . Types: 2 - 4 Standard drinks or equivalent per week   Comment: 7 cans of beer a week.     Social History   Tobacco Use  Smoking Status Former Smoker  . Packs/day: 1.00  . Types: Cigarettes  . Quit date: 03/30/2017  . Years since quitting: 1.5  Smokeless Tobacco Never Used     Outpatient Encounter  Medications as of 10/15/2018  Medication Sig  . colestipol (COLESTID) 1 g tablet Take 1 tablet (1 g total) by mouth 2 (two) times daily. (Patient taking differently: Take 1 g by mouth daily. )  . lisinopril (PRINIVIL,ZESTRIL) 10 MG tablet Take 1 tablet (10 mg total) by mouth daily. Patient needs office visit for further refills  . Vitamin D, Ergocalciferol, (DRISDOL) 1.25 MG (50000 UT) CAPS capsule Take 1 capsule (50,000 Units total) by mouth every 7 (seven) days.   No facility-administered encounter medications on file as of 10/15/2018.     Allergies: Patient has no known allergies.  There is no height or weight on file to calculate BMI.  There were no vitals taken for this visit.     Review of Systems     Objective:   Physical Exam        Assessment & Plan:  No diagnosis found.  No problem-specific Assessment & Plan notes found for this encounter.    FOLLOW-UP:  No follow-ups on file.

## 2018-10-15 ENCOUNTER — Encounter: Payer: 59 | Admitting: Adult Health

## 2018-10-25 ENCOUNTER — Other Ambulatory Visit: Payer: Self-pay | Admitting: Adult Health

## 2018-11-27 NOTE — Progress Notes (Deleted)
Subjective:    Patient ID: Matthew [name], male    DOB: Aug 18, 1960, 58 y.o.   MRN: [id number]  HPI:  Matthew [name] is here for CPE  04/12/2018 Labs- Vit d- low, 20.9, sent in ergocalciferol, will recheck in 4 months  TSH-WNL, 1.470  Lipid Panel-  The 10-year ASCVD risk score [name], et al., 2013) is: 11.6%  Values used to calculate the score:   Age: 55 years   Sex: Male   Is Non-Hispanic African American: No   Diabetic: No   Tobacco smoker: Yes   Systolic Blood Pressure: 425 mmHg   Is BP treated: Yes   HDL Cholesterol: 51 mg/dL   Total Cholesterol: 175 mg/dL  LDL-102  Will discuss statin therapy  A1c-6.1, continues to climb, really needs to focus on diet/exercise  CMP-stable  CBC-stable  Need A1c, Vit D level, Lipid panel  Healthcare Maintenance: Colonoscopy- Immunizations- AAA Screening-N/A LDCT-  Patient Care Team    Relationship Specialty Notifications Start End  Esaw Grandchild, NP PCP - General Family Medicine  04/11/16     Patient Active Problem List   Diagnosis Date Noted  . Diverticulitis   . Sigmoid diverticulitis 02/11/2018  . Diarrhea 10/16/2017  . Healthcare maintenance 10/16/2017  . Ear build-up, left 08/10/2017  . Acute maxillary sinusitis 07/25/2017  . Left otitis media 07/17/2017  . Hospital discharge follow-up 04/12/2017  . Hypertension 04/12/2017  . CAP (community acquired pneumonia) 03/30/2017  . Acute respiratory failure with hypoxia ([place]) 03/30/2017  . Acute bronchitis 03/30/2017  . Acute otitis media, right 05/05/2016  . Pharyngitis 05/05/2016     Past Medical History:  Diagnosis Date  . CAP (community acquired pneumonia) 03/31/2017  . COPD (chronic obstructive pulmonary disease) ([place])   . Hypertension      Past Surgical History:  Procedure Laterality Date  . HERNIA REPAIR    . HERNIA REPAIR  1966     Family History  Problem Relation Age of Onset  . Lymphoma Father   . Cancer Mother         breast  . Colon cancer Neg Hx   . Rectal cancer Neg Hx   . Stomach cancer Neg Hx      Social History   Substance and Sexual Activity  Drug Use Not Currently  . Frequency: 2.0 times per week  . Types: Marijuana     Social History   Substance and Sexual Activity  Alcohol Use Not Currently  . Alcohol/week: 2.0 - 4.0 standard drinks  . Types: 2 - 4 Standard drinks or equivalent per week   Comment: 7 cans of beer a week.     Social History   Tobacco Use  Smoking Status Former Smoker  . Packs/day: 1.00  . Types: Cigarettes  . Quit date: 03/30/2017  . Years since quitting: 1.6  Smokeless Tobacco Never Used     Outpatient Encounter Medications as of 11/28/2018  Medication Sig  . colestipol (COLESTID) 1 g tablet Take 1 tablet (1 g total) by mouth 2 (two) times daily. (Patient taking differently: Take 1 g by mouth daily. )  . lisinopril (ZESTRIL) 10 MG tablet TAKE 1 TABLET BY MOUTH DAILY  . Vitamin D, Ergocalciferol, (DRISDOL) 1.25 MG (50000 UT) CAPS capsule Take 1 capsule (50,000 Units total) by mouth every 7 (seven) days.   No facility-administered encounter medications on file as of 11/28/2018.     Allergies: Patient has no known allergies.  There is no height or weight  on file to calculate BMI.  There were no vitals taken for this visit.     Review of Systems     Objective:   Physical Exam        Assessment & Plan:  No diagnosis found.  No problem-specific Assessment & Plan notes found for this encounter.    FOLLOW-UP:  No follow-ups on file.

## 2018-11-28 ENCOUNTER — Encounter: Payer: 59 | Admitting: Adult Health

## 2019-04-23 ENCOUNTER — Other Ambulatory Visit: Payer: Self-pay | Admitting: Adult Health

## 2019-04-24 ENCOUNTER — Telehealth: Payer: Self-pay

## 2019-04-24 NOTE — Telephone Encounter (Signed)
Please call pt to schedule appt.  No further refills until pt is seen.  T. Sanjuan Sawa, CMA  

## 2019-04-30 ENCOUNTER — Encounter: Payer: Self-pay | Admitting: Adult Health

## 2019-04-30 ENCOUNTER — Ambulatory Visit (INDEPENDENT_AMBULATORY_CARE_PROVIDER_SITE_OTHER): Payer: 59 | Admitting: Adult Health

## 2019-04-30 ENCOUNTER — Other Ambulatory Visit: Payer: Self-pay

## 2019-04-30 VITALS — BP 163/89 | HR 101 | Temp 98.3°F | Ht 65.25 in | Wt 186.4 lb

## 2019-04-30 DIAGNOSIS — Z23 Encounter for immunization: Secondary | ICD-10-CM | POA: Diagnosis not present

## 2019-04-30 DIAGNOSIS — Z0000 Encounter for general adult medical examination without abnormal findings: Secondary | ICD-10-CM

## 2019-04-30 DIAGNOSIS — I10 Essential (primary) hypertension: Secondary | ICD-10-CM | POA: Diagnosis not present

## 2019-04-30 DIAGNOSIS — F439 Reaction to severe stress, unspecified: Secondary | ICD-10-CM

## 2019-04-30 NOTE — Patient Instructions (Addendum)

## 2019-04-30 NOTE — Assessment & Plan Note (Addendum)
Continue all medications as directed. Increase water intake, strive for at least 75 ounces/day.   Follow Heart Healthy diet Increase regular exercise.  Recommend at least 30 minutes daily, 5 days per week of walking, biking, swimming, YouTube/Pinterest workout videos. Referral to Behavioral Health/Psychology placed- someone from that practice will call you to schedule an appt. We will call you when lab results are available. Follow-up with primary care in 3 months. Continue to social distance and wear a mask when in public.

## 2019-04-30 NOTE — Progress Notes (Signed)
Subjective:    Patient ID: Matthew [name], male    DOB: [birth year], 59 y.o.   MRN: [id number]  HPI:  Matthew [name] is here for CPE. He denies regular exercise and has been trying to follow heart healthy diet. He estimates to drink 40-50 oz water/day. He continues to remain off tobacco- great! He estimates to drink 1/5 liquor per week. He reports increased stress due to financial concerns r/t to the following- 1) he has been out of work since Feb 2021 2) his mother's estate has not been well managed and more capital needs to be raised to cover taxes. 3) his expected retirement has to be pushed back to family financial concerns.  He reports early waking r/t to "over thinking". He denies SI/HI. He is agreeable to BH/pyschology referral.  Fasting labs obtained today  He has not taken his anti-hypertensive today- he denies chest pain/tightness with exertion.  Healthcare Maintenance: AAA Screening-declined  LDCT-ordered Colonoscopy-UTD, 2019-repeat 3-5 Immunizations-UTD  Patient Care Team    Relationship Specialty Notifications Start End  Matthew [name], Berna Spare, NP PCP - General Family Medicine  04/11/16     Patient Active Problem List   Diagnosis Date Noted  . Diverticulitis   . Sigmoid diverticulitis 02/11/2018  . Diarrhea 10/16/2017  . Healthcare maintenance 10/16/2017  . Ear build-up, left 08/10/2017  . Acute maxillary sinusitis 07/25/2017  . Left otitis media 07/17/2017  . Hospital discharge follow-up 04/12/2017  . Hypertension 04/12/2017  . CAP (community acquired pneumonia) 03/30/2017  . Acute respiratory failure with hypoxia ([place]) 03/30/2017  . Acute bronchitis 03/30/2017  . Acute otitis media, right 05/05/2016  . Pharyngitis 05/05/2016     Past Medical History:  Diagnosis Date  . CAP (community acquired pneumonia) 03/31/2017  . COPD (chronic obstructive pulmonary disease) ([place])   . Hypertension      Past Surgical History:  Procedure Laterality Date  . HERNIA  REPAIR    . HERNIA REPAIR  1966     Family History  Problem Relation Age of Onset  . Lymphoma Father   . Cancer Mother        breast  . Colon cancer Neg Hx   . Rectal cancer Neg Hx   . Stomach cancer Neg Hx      Social History   Substance and Sexual Activity  Drug Use Not Currently  . Frequency: 2.0 times per week  . Types: Marijuana     Social History   Substance and Sexual Activity  Alcohol Use Not Currently  . Alcohol/week: 2.0 - 4.0 standard drinks  . Types: 2 - 4 Standard drinks or equivalent per week   Comment: 7 cans of beer a week.     Social History   Tobacco Use  Smoking Status Former Smoker  . Packs/day: 1.00  . Types: Cigarettes  . Quit date: 03/30/2017  . Years since quitting: 2.0  Smokeless Tobacco Never Used     Outpatient Encounter Medications as of 04/30/2019  Medication Sig  . lisinopril (ZESTRIL) 10 MG tablet TAKE 1 TABLET BY MOUTH DAILY  . Vitamin D, Ergocalciferol, (DRISDOL) 1.25 MG (50000 UT) CAPS capsule Take 1 capsule (50,000 Units total) by mouth every 7 (seven) days.  . [DISCONTINUED] colestipol (COLESTID) 1 g tablet Take 1 tablet (1 g total) by mouth 2 (two) times daily. (Patient taking differently: Take 1 g by mouth daily. )   No facility-administered encounter medications on file as of 04/30/2019.    Allergies: Patient has no known allergies.  Body mass index is 30.78 kg/m.  Blood pressure (!) 163/89, pulse (!) 101, temperature 98.3 F (36.8 C), temperature source Oral, height 5' 5.25" (1.657 m), weight 186 lb 6.4 oz (84.6 kg), SpO2 94 %.    Review of Systems  Constitutional: Positive for fatigue. Negative for activity change, appetite change, chills, diaphoresis, fever and unexpected weight change.  HENT: Positive for congestion, postnasal drip and rhinorrhea.   Eyes: Negative for visual disturbance.  Respiratory: Negative for cough, chest tightness, shortness of breath, wheezing and stridor.   Cardiovascular: Negative  for chest pain, palpitations and leg swelling.  Gastrointestinal: Negative for abdominal distention, abdominal pain, blood in stool, constipation, diarrhea, nausea and vomiting.  Endocrine: Negative for polydipsia, polyphagia and polyuria.  Genitourinary: Negative for difficulty urinating and flank pain.  Musculoskeletal: Negative for arthralgias, back pain, gait problem, joint swelling, myalgias, neck pain and neck stiffness.  Neurological: Negative for dizziness and headaches.  Hematological: Negative for adenopathy. Does not bruise/bleed easily.  Psychiatric/Behavioral: Positive for sleep disturbance. Negative for agitation, behavioral problems, confusion, decreased concentration, dysphoric mood, hallucinations, self-injury and suicidal ideas. The patient is nervous/anxious. The patient is not hyperactive.        Objective:   Physical Exam Vitals and nursing note reviewed.  Constitutional:      General: He is not in acute distress.    Appearance: Normal appearance. He is obese. He is not ill-appearing, toxic-appearing or diaphoretic.  HENT:     Head: Normocephalic and atraumatic.     Right Ear: Tympanic membrane, ear canal and external ear normal.     Left Ear: Tympanic membrane, ear canal and external ear normal.     Nose: Mucosal edema, congestion and rhinorrhea present.     Right Turbinates: Swollen.     Left Turbinates: Swollen.     Right Sinus: No maxillary sinus tenderness or frontal sinus tenderness.     Left Sinus: No maxillary sinus tenderness or frontal sinus tenderness.  Eyes:     Extraocular Movements: Extraocular movements intact.     Conjunctiva/sclera: Conjunctivae normal.     Pupils: Pupils are equal, round, and reactive to light.  Cardiovascular:     Rate and Rhythm: Normal rate and regular rhythm.     Pulses: Normal pulses.     Heart sounds: Normal heart sounds. No murmur. No friction rub. No gallop.   Pulmonary:     Effort: Pulmonary effort is normal. No  respiratory distress.     Breath sounds: Normal breath sounds. No stridor. No wheezing, rhonchi or rales.  Chest:     Chest wall: No tenderness.  Abdominal:     General: Abdomen is protuberant. Bowel sounds are normal.     Palpations: Abdomen is soft.     Tenderness: There is no abdominal tenderness. There is no right CVA tenderness or left CVA tenderness.  Genitourinary:    Comments: He declined DRE/gential examination  Musculoskeletal:        General: No tenderness. Normal range of motion.     Cervical back: Normal range of motion and neck supple.  Skin:    General: Skin is warm and dry.     Capillary Refill: Capillary refill takes less than 2 seconds.  Neurological:     Mental Status: He is alert and oriented to person, place, and time.     Coordination: Coordination normal.  Psychiatric:        Mood and Affect: Mood normal.        Behavior: Behavior normal.  Thought Content: Thought content normal.        Judgment: Judgment normal.         Assessment & Plan:   1. Hypertension, unspecified type   2. Healthcare maintenance   3. Stress     Healthcare maintenance Continue all medications as directed. Increase water intake, strive for at least 75 ounces/day.   Follow Heart Healthy diet Increase regular exercise.  Recommend at least 30 minutes daily, 5 days per week of walking, biking, swimming, YouTube/Pinterest workout videos. Referral to Behavioral Health/Psychology placed- someone from that practice will call you to schedule an appt. We will call you when lab results are available. Follow-up with primary care in 3 months. Continue to social distance and wear a mask when in public.  Hypertension Lisinopril 10mg  QD He has not taken Rx today CMP drawn today    FOLLOW-UP:  Return in about 3 months (around 07/28/2019) for HTN.

## 2019-04-30 NOTE — Assessment & Plan Note (Addendum)
Lisinopril 10mg  QD He has not taken Rx today CMP drawn today

## 2019-05-01 LAB — COMPREHENSIVE METABOLIC PANEL
ALT: 61 IU/L — ABNORMAL HIGH (ref 0–44)
AST: 42 IU/L — ABNORMAL HIGH (ref 0–40)
Albumin/Globulin Ratio: 1.9 (ref 1.2–2.2)
Albumin: 4.5 g/dL (ref 3.8–4.9)
Alkaline Phosphatase: 89 IU/L (ref 39–117)
BUN/Creatinine Ratio: 10 (ref 9–20)
BUN: 11 mg/dL (ref 6–24)
Bilirubin Total: 0.5 mg/dL (ref 0.0–1.2)
CO2: 27 mmol/L (ref 20–29)
Calcium: 9.9 mg/dL (ref 8.7–10.2)
Chloride: 96 mmol/L (ref 96–106)
Creatinine, Ser: 1.06 mg/dL (ref 0.76–1.27)
GFR calc Af Amer: 89 mL/min/{1.73_m2} (ref >59)
GFR calc non Af Amer: 77 mL/min/{1.73_m2} (ref >59)
Globulin, Total: 2.4 g/dL (ref 1.5–4.5)
Glucose: 106 mg/dL — ABNORMAL HIGH (ref 65–99)
Potassium: 4.4 mmol/L (ref 3.5–5.2)
Sodium: 138 mmol/L (ref 134–144)
Total Protein: 6.9 g/dL (ref 6.0–8.5)

## 2019-05-01 LAB — CBC WITH DIFFERENTIAL/PLATELET
Basophils Absolute: 0.1 10*3/uL (ref 0.0–0.2)
Basos: 1 %
EOS (ABSOLUTE): 0.2 10*3/uL (ref 0.0–0.4)
Eos: 3 %
Hematocrit: 50 % (ref 37.5–51.0)
Hemoglobin: 17 g/dL (ref 13.0–17.7)
Immature Grans (Abs): 0 10*3/uL (ref 0.0–0.1)
Immature Granulocytes: 1 %
Lymphocytes Absolute: 1.5 10*3/uL (ref 0.7–3.1)
Lymphs: 25 %
MCH: 32.4 pg (ref 26.6–33.0)
MCHC: 34 g/dL (ref 31.5–35.7)
MCV: 95 fL (ref 79–97)
Monocytes Absolute: 0.8 10*3/uL (ref 0.1–0.9)
Monocytes: 13 %
Neutrophils Absolute: 3.5 10*3/uL (ref 1.4–7.0)
Neutrophils: 57 %
Platelets: 259 10*3/uL (ref 150–450)
RBC: 5.25 x10E6/uL (ref 4.14–5.80)
RDW: 12.6 % (ref 11.6–15.4)
WBC: 6.1 10*3/uL (ref 3.4–10.8)

## 2019-05-01 LAB — TSH: TSH: 1.52 u[IU]/mL (ref 0.450–4.500)

## 2019-05-01 LAB — LIPID PANEL
Chol/HDL Ratio: 4.5 ratio (ref 0.0–5.0)
Cholesterol, Total: 227 mg/dL — ABNORMAL HIGH (ref 100–199)
HDL: 51 mg/dL (ref >39)
LDL Chol Calc (NIH): 152 mg/dL — ABNORMAL HIGH (ref 0–99)
Triglycerides: 135 mg/dL (ref 0–149)
VLDL Cholesterol Cal: 24 mg/dL (ref 5–40)

## 2019-05-01 LAB — HEMOGLOBIN A1C
Est. average glucose Bld gHb Est-mCnc: 120 mg/dL
Hgb A1c MFr Bld: 5.8 % — ABNORMAL HIGH (ref 4.8–5.6)

## 2019-05-01 LAB — HEPATITIS C ANTIBODY: Hep C Virus Ab: <0.1 s/co ratio (ref 0.0–0.9)

## 2019-06-11 ENCOUNTER — Other Ambulatory Visit: Payer: Self-pay | Admitting: Adult Health

## 2019-06-12 ENCOUNTER — Other Ambulatory Visit: Payer: Self-pay | Admitting: Adult Health

## 2019-06-12 MED ORDER — LISINOPRIL 10 MG PO TABS: 10.0000 mg | ORAL_TABLET | Freq: Every day | ORAL | 1 refills | Status: DC

## 2019-06-12 NOTE — Telephone Encounter (Signed)
Pt came by office states had OV w/ [name] in Feb & thought she was going to refill his Rxs, but there was not Rx refill for his :    lisinopril (ZESTRIL) 10 MG tablet [[id number]] DISCONTINUED  Order Details Dose, Route, Frequency: As Directed  Dispense Quantity: 90 tablet Refills: 0       Sig: TAKE 1 TABLET BY MOUTH DAILY    --Forwarding refill request to med asst that if approved send refill order to :  Preferred Pharm[facility]      [place], [place]. [phone] (Phone) [phone] (Fax   --glh

## 2019-06-12 NOTE — Telephone Encounter (Signed)
Med refill sent to pharmacy. Patient is aware. AS CMA

## 2019-06-14 ENCOUNTER — Other Ambulatory Visit: Payer: 59

## 2019-07-08 ENCOUNTER — Ambulatory Visit
Admission: EM | Admit: 2019-07-08 | Discharge: 2019-07-08 | Disposition: A | Discharge status: Home / Self Care | Payer: BC Managed Care – PPO | Attending: Physician Assistant | Admitting: Physician Assistant

## 2019-07-08 ENCOUNTER — Telehealth: Payer: Self-pay | Admitting: Family Medicine

## 2019-07-08 ENCOUNTER — Telehealth: Payer: Self-pay | Admitting: Adult Health

## 2019-07-08 DIAGNOSIS — I10 Essential (primary) hypertension: Secondary | ICD-10-CM | POA: Diagnosis not present

## 2019-07-08 NOTE — Discharge Instructions (Signed)
No alarming signs on exam.  Please take your blood pressure twice a day and write down the number. Keep hydrated, urine should be clear to pale yellow in color.  Decrease alcohol, caffeine, salt.  Follow-up with PCP for reevaluation and management have high blood pressure.  If blood pressure continues to be elevated, now with headache, blurry vision, one-sided weakness, facial drooping, chest pain, shortness of breath, go to the emergency department for further evaluation.

## 2019-07-08 NOTE — ED Provider Notes (Signed)
[facility]    CSN: [id number] Arrival date & time: 07/08/19  0919      History   Chief Complaint Chief Complaint  Patient presents with  . Hypertension    HPI Matthew [name] is a 59 y.o. male.   59 year old male with history of hypertension, COPD comes in for 1 week history of elevated blood pressure readings.  States he also feels he is more sensitive to sunlight when he walks out first thing in the morning.  Denies phonophobia, nausea, vomiting.  Denies headache, changes in vision.  Has nasal congestion at baseline without other URI symptoms.  Denies fever, chills, body aches.  Denies chest pain, shortness of breath.  Had experienced few seconds of lightheadedness where he describes as "airy" that has since resolved.  Denies weakness, dizziness.  Consistent alcohol use, "couple of drinks" at night, drinks liquor.  Former smoker.      Past Medical History:  Diagnosis Date  . CAP (community acquired pneumonia) 03/31/2017  . COPD (chronic obstructive pulmonary disease) ([place])   . Hypertension     Patient Active Problem List   Diagnosis Date Noted  . Diverticulitis   . Sigmoid diverticulitis 02/11/2018  . Diarrhea 10/16/2017  . Healthcare maintenance 10/16/2017  . Ear build-up, left 08/10/2017  . Acute maxillary sinusitis 07/25/2017  . Left otitis media 07/17/2017  . Hospital discharge follow-up 04/12/2017  . Hypertension 04/12/2017  . CAP (community acquired pneumonia) 03/30/2017  . Acute respiratory failure with hypoxia ([place]) 03/30/2017  . Acute bronchitis 03/30/2017  . Acute otitis media, right 05/05/2016  . Pharyngitis 05/05/2016    Past Surgical History:  Procedure Laterality Date  . HERNIA REPAIR    . [place] Medications    Prior to Admission medications   Medication Sig Start Date End Date Taking? Authorizing Provider  lisinopril (ZESTRIL) 10 MG tablet Take 1 tablet (10 mg total) by mouth daily. 06/12/19    Opalski, Neoma Laming, DO  Vitamin D, Ergocalciferol, (DRISDOL) 1.25 MG (50000 UT) CAPS capsule Take 1 capsule (50,000 Units total) by mouth every 7 (seven) days. 04/12/18   Esaw Grandchild, NP    Family History Family History  Problem Relation Age of Onset  . Lymphoma Father   . Cancer Mother        breast  . Colon cancer Neg Hx   . Rectal cancer Neg Hx   . Stomach cancer Neg Hx     Social History Social History   Tobacco Use  . Smoking status: Former Smoker    Packs/day: 1.00    Types: Cigarettes    Quit date: 03/30/2017    Years since quitting: 2.2  . Smokeless tobacco: Never Used  Substance Use Topics  . Alcohol use: Not on file    Comment: 7 cans of beer a week.  . Drug use: Not Currently    Frequency: 2.0 times per week    Types: Marijuana     Allergies   Patient has no known allergies.   Review of Systems Review of Systems  Reason unable to perform ROS: See HPI as above.     Physical Exam Triage Vital Signs ED Triage Vitals  Enc Vitals Group     BP 07/08/19 0951 (!) 175/90     Pulse Rate 07/08/19 0951 84     Resp 07/08/19 0951 16     Temp 07/08/19 0951 97.8 F (36.6 C)     Temp  Source 07/08/19 0951 Oral     SpO2 07/08/19 0951 97 %     Weight --      Height --      Head Circumference --      Peak Flow --      Pain Score 07/08/19 0956 0     Pain Loc --      Pain Edu? --      Excl. in [place]? --    No data found.  Updated Vital Signs BP (!) 158/94 (BP Location: Left Arm)   Pulse 84   Temp 97.8 F (36.6 C) (Oral)   Resp 16   SpO2 97%   Physical Exam Constitutional:      General: He is not in acute distress.    Appearance: Normal appearance. He is well-developed. He is not toxic-appearing or diaphoretic.  HENT:     Head: Normocephalic and atraumatic.     Mouth/Throat:     Mouth: Mucous membranes are moist.     Pharynx: Oropharynx is clear. Uvula midline.  Eyes:     Extraocular Movements: Extraocular movements intact.     Conjunctiva/sclera:  Conjunctivae normal.     Pupils: Pupils are equal, round, and reactive to light.     Comments: No photophobia on exam.   Cardiovascular:     Rate and Rhythm: Normal rate and regular rhythm.  Pulmonary:     Effort: Pulmonary effort is normal. No respiratory distress.     Comments: Speaking in full sentences without difficulty. LCTAB Musculoskeletal:     Cervical back: Normal range of motion and neck supple.  Skin:    General: Skin is warm and dry.  Neurological:     Mental Status: He is alert and oriented to person, place, and time.     Comments: Cranial nerves II-XII grossly intact. Strength 5/5 bilaterally for upper and lower extremity. Sensation intact. Normal coordination with normal finger to nose, heel to shin. Negative pronator drift, romberg. Gait intact. Able to ambulate on own without difficulty.     UC Treatments / Result  Labs (all labs ordered are listed, but only abnormal results are displayed) Labs Reviewed - No data to display  EKG   Radiology No results found.  Procedures Procedures (including critical care time)  Medications Ordered in UC Medications - No data to display  Initial Impression / Assessment and Plan / UC Course  I have reviewed the triage vital signs and the nursing notes.  Pertinent labs & imaging results that were available during my care of the patient were reviewed by me and considered in my medical decision making (see chart for details).    Patient without chest pain, shortness of breath.  Neurology exam grossly intact without focal deficits.  No photophobia on exam.  No indications of hypertensive urgency/emergency at this time.  Patient to continue blood pressure medication, document daily readings.  Push fluids.  Follow-up with PCP for further adjustment of medication needed.  Return precautions given.  Patient expresses understanding and agrees to plan.  Final Clinical Impressions(s) / UC Diagnoses   Final diagnoses:  Essential  hypertension   ED Prescriptions    None     PDMP not reviewed this encounter.   Ok Edwards, PA-C 07/08/19 1321

## 2019-07-08 NOTE — Telephone Encounter (Signed)
Matthew [name], I am forwarding this message from a patient previously managed by [name] to you for your attention/ review. Thanks.

## 2019-07-08 NOTE — Telephone Encounter (Signed)
Patient called office stating that his BP has been elevated for the past few days. States yesterday his BP reading was 186/104 3 hours after taking Lisinopril and today his BP reading is 173/104. Patient states he is have fatigue and light sensitivity.   Patient advised to go to UC or ED for elevated/symptomatic BP readings. Patient was reluctant to go to UC but I advised patient with a BP that high he could have a stroke. Patient verbalized understanding and said he would go to UC. AS, CMA

## 2019-07-08 NOTE — ED Triage Notes (Signed)
Pt c/o having hypertension for past few weeks. States has been having light sensitivity. States sent here from PCP

## 2019-07-09 ENCOUNTER — Telehealth: Payer: Self-pay | Admitting: Adult Health

## 2019-07-09 NOTE — Telephone Encounter (Signed)
Agree with recommendations given and will address HTN at follow-up appointment on 07/24/19.   Thank you! Lorrene Reid, PA-C

## 2019-07-09 NOTE — Telephone Encounter (Signed)
Patient calling back today with BP reading this morning of 188/105 and this afternoon 177/99. Patient states he has light sensitivity and feeling jittery. Patient requesting advise. I spoke with Dr. Raliegh Scarlet in regards to this patient and his BP readings and symptoms. She advised patient should go to ED for evaluation.   Patient has been advised of the above and verbalized understanding. AS, CMA

## 2019-07-09 NOTE — Telephone Encounter (Signed)
Brief discussion had btwn CMA and myself about this pt.    Pt had called today concerned b/c his BP was up quite substantially for him at 188/105 and that he was feeling weird and jittery.  He verbalized his concerns about having stroke-like sx since he was sensitive to light ( a new neuro sx) and not feeling like himself at all.     It was discussed with me that this was a pt whom I had never seen and who prior had been followed by my former colleague at the clinic.   Furthermore, I was told that I did not have any available appts today, tomorrow nor the rest of the week.    Also history given to me by CMA was that pt just seen in the UC for these very sx and that no tests were done, no txmnts were rendered and pt was growing increasingly concerned about these ongoing concerns that were NEW and NOT resolving.   Due to pt hx given to me today as above - advised CMA to tell pt to seek eval by advanced practitioner in our ER at this time.

## 2019-07-16 ENCOUNTER — Telehealth: Payer: Self-pay | Admitting: Adult Health

## 2019-07-16 ENCOUNTER — Encounter: Payer: Self-pay | Admitting: Physician Assistant

## 2019-07-16 ENCOUNTER — Other Ambulatory Visit: Payer: Self-pay

## 2019-07-16 ENCOUNTER — Ambulatory Visit (INDEPENDENT_AMBULATORY_CARE_PROVIDER_SITE_OTHER): Payer: BC Managed Care – PPO | Admitting: Physician Assistant

## 2019-07-16 VITALS — BP 175/83 | HR 97 | Temp 97.7°F | Ht 65.25 in | Wt 185.6 lb

## 2019-07-16 DIAGNOSIS — I10 Essential (primary) hypertension: Secondary | ICD-10-CM | POA: Diagnosis not present

## 2019-07-16 DIAGNOSIS — E7800 Pure hypercholesterolemia, unspecified: Secondary | ICD-10-CM | POA: Diagnosis not present

## 2019-07-16 DIAGNOSIS — R5383 Other fatigue: Secondary | ICD-10-CM

## 2019-07-16 MED ORDER — BISOPROLOL-HYDROCHLOROTHIAZIDE 2.5-6.25 MG PO TABS: 1.0000 | ORAL_TABLET | Freq: Every day | ORAL | 1 refills | Status: DC

## 2019-07-16 NOTE — Patient Instructions (Signed)
Your goal blood pressure should be around 130/80 or less on a regular basis.    However, if you are 59 years old or greater, then higher blood pressures are often tolerated based on your medical conditions.  Please follow the recommendations discussed with you by your primary care provider and/or cardiologist.       Hypertension Hypertension, commonly called high blood pressure, is when the force of blood pumping through the arteries is too strong. The arteries are the blood vessels that carry blood from the heart throughout the body. Hypertension forces the heart to work harder to pump blood and may cause arteries to become narrow or stiff. Having untreated or uncontrolled hypertension can cause heart attacks, strokes, kidney disease, and other problems. A blood pressure reading consists of a higher number over a lower number. Ideally, your blood pressure should be below 120/80. The first ("top") number is called the systolic pressure. It is a measure of the pressure in your arteries as your heart beats. The second ("bottom") number is called the diastolic pressure. It is a measure of the pressure in your arteries as the heart relaxes. What are the causes? The cause of this condition is not known. What increases the risk? Some risk factors for high blood pressure are under your control. Others are not. Factors you can change  Smoking.  Having type 2 diabetes mellitus, high cholesterol, or both.  Not getting enough exercise or physical activity.  Being overweight.  Having too much fat, sugar, calories, or salt (sodium) in your diet.  Drinking too much alcohol. Factors that are difficult or impossible to change  Having chronic kidney disease.  Having a family history of high blood pressure.  Age. Risk increases with age.  Race. You may be at higher risk if you are African-American.  Gender. Men are at higher risk than women before age 51. After age 59, women are at higher risk than  men.  Having obstructive sleep apnea.  Stress. What are the signs or symptoms? Extremely high blood pressure (hypertensive crisis) may cause:  Headache.  Anxiety.  Shortness of breath.  Nosebleed.  Nausea and vomiting.  Severe chest pain.  Jerky movements you cannot control (seizures).  How is this diagnosed? This condition is diagnosed by measuring your blood pressure while you are seated, with your arm resting on a surface. The cuff of the blood pressure monitor will be placed directly against the skin of your upper arm at the level of your heart. It should be measured at least twice using the same arm. Certain conditions can cause a difference in blood pressure between your right and left arms. Certain factors can cause blood pressure readings to be lower or higher than normal (elevated) for a short period of time:  When your blood pressure is higher when you are in a health care provider's office than when you are at home, this is called white coat hypertension. Most people with this condition do not need medicines.  When your blood pressure is higher at home than when you are in a health care provider's office, this is called masked hypertension. Most people with this condition may need medicines to control blood pressure.  If you have a high blood pressure reading during one visit or you have normal blood pressure with other risk factors:  You may be asked to return on a different day to have your blood pressure checked again.  You may be asked to monitor your blood pressure at home  for 1 week or longer.  If you are diagnosed with hypertension, you may have other blood or imaging tests to help your health care provider understand your overall risk for other conditions. How is this treated? This condition is treated by making healthy lifestyle changes, such as eating healthy foods, exercising more, and reducing your alcohol intake. Your health care provider may prescribe  medicine if lifestyle changes are not enough to get your blood pressure under control, and if:  Your systolic blood pressure is above 130.  Your diastolic blood pressure is above 80.  Your personal target blood pressure may vary depending on your medical conditions, your age, and other factors. Follow these instructions at home: Eating and drinking  Eat a diet that is high in fiber and potassium, and low in sodium, added sugar, and fat. An example eating plan is called the DASH (Dietary Approaches to Stop Hypertension) diet. To eat this way: ? Eat plenty of fresh fruits and vegetables. Try to fill half of your plate at each meal with fruits and vegetables. ? Eat whole grains, such as whole wheat pasta, brown rice, or whole grain bread. Fill about one quarter of your plate with whole grains. ? Eat or drink low-fat dairy products, such as skim milk or low-fat yogurt. ? Avoid fatty cuts of meat, processed or cured meats, and poultry with skin. Fill about one quarter of your plate with lean proteins, such as fish, chicken without skin, beans, eggs, and tofu. ? Avoid premade and processed foods. These tend to be higher in sodium, added sugar, and fat.  Reduce your daily sodium intake. Most people with hypertension should eat less than 1,500 mg of sodium a day.  Limit alcohol intake to no more than 1 drink a day for nonpregnant women and 2 drinks a day for men. One drink equals 12 oz of beer, 5 oz of wine, or 1 oz of hard liquor. Lifestyle  Work with your health care provider to maintain a healthy body weight or to lose weight. Ask what an ideal weight is for you.  Get at least 30 minutes of exercise that causes your heart to beat faster (aerobic exercise) most days of the week. Activities may include walking, swimming, or biking.  Include exercise to strengthen your muscles (resistance exercise), such as pilates or lifting weights, as part of your weekly exercise routine. Try to do these types  of exercises for 30 minutes at least 3 days a week.  Do not use any products that contain nicotine or tobacco, such as cigarettes and e-cigarettes. If you need help quitting, ask your health care provider.  Monitor your blood pressure at home as told by your health care provider.  Keep all follow-up visits as told by your health care provider. This is important. Medicines  Take over-the-counter and prescription medicines only as told by your health care provider. Follow directions carefully. Blood pressure medicines must be taken as prescribed.  Do not skip doses of blood pressure medicine. Doing this puts you at risk for problems and can make the medicine less effective.  Ask your health care provider about side effects or reactions to medicines that you should watch for. Contact a health care provider if:  You think you are having a reaction to a medicine you are taking.  You have headaches that keep coming back (recurring).  You feel dizzy.  You have swelling in your ankles.  You have trouble with your vision. Get help right away if:  You develop a severe headache or confusion.  You have unusual weakness or numbness.  You feel faint.  You have severe pain in your chest or abdomen.  You vomit repeatedly.  You have trouble breathing. Summary  Hypertension is when the force of blood pumping through your arteries is too strong. If this condition is not controlled, it may put you at risk for serious complications.  Your personal target blood pressure may vary depending on your medical conditions, your age, and other factors. For most people, a normal blood pressure is less than 120/80.  Hypertension is treated with lifestyle changes, medicines, or a combination of both. Lifestyle changes include weight loss, eating a healthy, low-sodium diet, exercising more, and limiting alcohol. This information is not intended to replace advice given to you by your health care provider.  Make sure you discuss any questions you have with your health care provider. Document Released: 03/14/2005 Document Revised: 02/10/2016 Document Reviewed: 02/10/2016 Elsevier Interactive Patient Education  2018 [employer].    How to Take Your Blood Pressure   Blood pressure is a measurement of how strongly your blood is pressing against the walls of your arteries. Arteries are blood vessels that carry blood from your heart throughout your body. Your health care provider takes your blood pressure at each office visit. You can also take your own blood pressure at home with a blood pressure machine. You may need to take your own blood pressure:  To confirm a diagnosis of high blood pressure (hypertension).  To monitor your blood pressure over time.  To make sure your blood pressure medicine is working.  Supplies needed: To take your blood pressure, you will need a blood pressure machine. You can buy a blood pressure machine, or blood pressure monitor, at most drugstores or online. There are several types of home blood pressure monitors. When choosing one, consider the following:  Choose a monitor that has an arm cuff.  Choose a monitor that wraps snugly around your upper arm. You should be able to fit only one finger between your arm and the cuff.  Do not choose a monitor that measures your blood pressure from your wrist or finger.  Your health care provider can suggest a reliable monitor that will meet your needs. How to prepare To get the most accurate reading, avoid the following for 30 minutes before you check your blood pressure:  Drinking caffeine.  Drinking alcohol.  Eating.  Smoking.  Exercising.  Five minutes before you check your blood pressure:  Empty your bladder.  Sit quietly without talking in a dining chair, rather than in a soft couch or armchair.  How to take your blood pressure To check your blood pressure, follow the instructions in the manual that  came with your blood pressure monitor. If you have a digital blood pressure monitor, the instructions may be as follows: 1. Sit up straight. 2. Place your feet on the floor. Do not cross your ankles or legs. 3. Rest your left arm at the level of your heart on a table or desk or on the arm of a chair. 4. Pull up your shirt sleeve. 5. Wrap the blood pressure cuff around the upper part of your left arm, 1 inch (2.5 cm) above your elbow. It is best to wrap the cuff around bare skin. 6. Fit the cuff snugly around your arm. You should be able to place only one finger between the cuff and your arm. 7. Position the cord inside the  groove of your elbow. 8. Press the power button. 9. Sit quietly while the cuff inflates and deflates. 10. Read the digital reading on the monitor screen and write it down (record it). 11. Wait 2-3 minutes, then repeat the steps, starting at step 1.  What does my blood pressure reading mean? A blood pressure reading consists of a higher number over a lower number. Ideally, your blood pressure should be below 120/80. The first ("top") number is called the systolic pressure. It is a measure of the pressure in your arteries as your heart beats. The second ("bottom") number is called the diastolic pressure. It is a measure of the pressure in your arteries as the heart relaxes. Blood pressure is classified into four stages. The following are the stages for adults who do not have a short-term serious illness or a chronic condition. Systolic pressure and diastolic pressure are measured in a unit called mm Hg. Normal  Systolic pressure: below 563.  Diastolic pressure: below 80. Elevated  Systolic pressure: 893-734.  Diastolic pressure: below 80. Hypertension stage 1  Systolic pressure: 287-681.  Diastolic pressure: 15-72. Hypertension stage 2  Systolic pressure: 620 or above.  Diastolic pressure: 90 or above. You can have prehypertension or hypertension even if only the  systolic or only the diastolic number in your reading is higher than normal. Follow these instructions at home:  Check your blood pressure as often as recommended by your health care provider.  Take your monitor to the next appointment with your health care provider to make sure: ? That you are using it correctly. ? That it provides accurate readings.  Be sure you understand what your goal blood pressure numbers are.  Tell your health care provider if you are having any side effects from blood pressure medicine. Contact a health care provider if:  Your blood pressure is consistently high. Get help right away if:  Your systolic blood pressure is higher than 180.  Your diastolic blood pressure is higher than 110. This information is not intended to replace advice given to you by your health care provider. Make sure you discuss any questions you have with your health care provider. Document Released: 08/21/2015 Document Revised: 11/03/2015 Document Reviewed: 08/21/2015 Elsevier Interactive Patient Education  [employer].

## 2019-07-16 NOTE — Telephone Encounter (Signed)
Patient had OV today about BP and was prescribed Ziac, patient called back after leaving office to ask if he should stop the lisinopril and start this new med. I talked to [facility] and she advised that he was to stop the lisinopril and begin the Ziac. Patient aware and understanding.

## 2019-07-16 NOTE — Progress Notes (Signed)
Impression and Recommendations:    1. Hypertension, uncontrolled   2. Fatigue, unspecified type   3. Elevated LDL cholesterol level    Hypertension: - BP is not well controlled, patient is symptomatic, orthostatics are normal, and pulse is in the high range of normal, so will discontinue his lisinopril and start combination therapy of bisoprolol and hydrochlorothiazide.  - Encouraged Pt to continue checking BP and pulse, keep a log to monitor efficacy of new medication and will make any necessary dose adjustments at next office visit.  - BP goal is <135/80.  - Will recheck CMP at follow-up visit to monitor for side effects.  - Encouraged to continue lifestyle modifications of trying to eat less sodium.     Elevated LDL level: - Patient is fasting and his most recent lipid panel (04/2019) LDL had increased to 152, previous PCP recommended to recheck lipids in 3 months so will draw today. - Patient's uncontrolled hypertension and elevated cholesterol are risk factors for cardiovascular disease, so pending lab results will recommend starting statin therapy if patient agreeable.  - Will continue to monitor.  Fatigue: - Patient has noticed decreased energy levels. - Ordered cbc, TSH and cmp to evaluate for possible etiologies.   There are no diagnoses linked to this encounter.    Education and routine counseling performed. Handouts provided.  Orders Placed This Encounter  Procedures  . Comp Met (CMET)  . CBC w/Diff  . TSH  . Lipid Profile    Meds ordered this encounter  Medications  . bisoprolol-hydrochlorothiazide (ZIAC) 2.5-6.25 MG tablet    Sig: Take 1 tablet by mouth daily.    Dispense:  30 tablet    Refill:  1    There are no discontinued medications.   The patient was counseled, risk factors were discussed, anticipatory guidance given.  Gross side effects, risk and benefits, and alternatives of medications discussed with patient.  Patient is aware that all  medications have potential side effects and we are unable to predict every side effect or drug-drug interaction that may occur.  Expresses verbal understanding and consents to current therapy plan and treatment regimen.  Return for in 6 weeks for HTN, started new medication, recheck CMP.  Please see AVS handed out to patient at the end of our visit for further patient instructions/ counseling done pertaining to today's office visit.       The [place] was signed into law in 2016 which includes the topic of electronic health records.  This provides immediate access to information in MyChart.  This includes consultation notes, operative notes, office notes, lab results and pathology reports.  If you have any questions about what you read please let us know at your next visit or call us at the office.  We are right here with you.     Subjective:     HPI: Matthew [name] is a 60 y.o. male who presents to [place] at [facility] today for follow up on HTN.     HTN: -  His blood pressure has not been controlled at home.  Pt  has been checking it regularly.  - Brings BP log and readings range in 140s-180s/90s-100 with pulse in 90s-100s. - States he is light sensitive and can tell when his BP is elevated. Seems like BP is highest in the morning when he wakes up and seems to get better after he has a "few drinks". States he usually has no more than  3 drinks a day.  - He went to UC on 07/08/19 and states they didn't do anything. They just made sure he wasn't having a stroke. - Yesterday he felt weird like "seeing fuzzy" and felt "gazy" which resolved after 15 minutes. Denies LOC, impaired speech, or weakness. - Patient reports good compliance with blood pressure medications. - Denies any new prescribed or OTC meds, increased stress or excessive caffeine consumption. - His salt consumption is the same or less. - Reports doubling his dose of lisinopril. - Denies  medication S-E. - He denies new onset of: chest pain, exercise intolerance, shortness of breath, dizziness, visual changes, headache, lower extremity swelling or claudication.    Last 3 blood pressure readings in our office are as follows: BP Readings from Last 3 Encounters:  07/16/19 (!) 175/83  07/08/19 (!) 158/94  04/30/19 (!) 163/89    Pulse Readings from Last 3 Encounters:  07/16/19 97  07/08/19 84  04/30/19 (!) 101    Filed Weights   07/16/19 0758  Weight: 185 lb 9.6 oz (84.2 kg)      Patient Care Team    Relationship Specialty Notifications Start End  Mina Marble D, NP PCP - General Family Medicine  04/11/16      Lab Results  Component Value Date   CREATININE 1.06 04/30/2019   BUN 11 04/30/2019   NA 138 04/30/2019   K 4.4 04/30/2019   CL 96 04/30/2019   CO2 27 04/30/2019    Lab Results  Component Value Date   CHOL 227 (H) 04/30/2019   CHOL 175 04/11/2018   CHOL 191 06/14/2017    Lab Results  Component Value Date   HDL 51 04/30/2019   HDL 51 04/11/2018   HDL 66 06/14/2017    Lab Results  Component Value Date   LDLCALC 152 (H) 04/30/2019   LDLCALC 102 (H) 04/11/2018   LDLCALC 111 (H) 06/14/2017    Lab Results  Component Value Date   TRIG 135 04/30/2019   TRIG 111 04/11/2018   TRIG 69 06/14/2017    Lab Results  Component Value Date   CHOLHDL 4.5 04/30/2019   CHOLHDL 3.4 04/11/2018   CHOLHDL 2.9 06/14/2017    No results found for: LDLDIRECT ===================================================================   Patient Active Problem List   Diagnosis Date Noted  . Diverticulitis   . Sigmoid diverticulitis 02/11/2018  . Diarrhea 10/16/2017  . Healthcare maintenance 10/16/2017  . Ear build-up, left 08/10/2017  . Acute maxillary sinusitis 07/25/2017  . Left otitis media 07/17/2017  . Hospital discharge follow-up 04/12/2017  . Hypertension 04/12/2017  . CAP (community acquired pneumonia) 03/30/2017  . Acute respiratory failure  with hypoxia ([place]) 03/30/2017  . Acute bronchitis 03/30/2017  . Acute otitis media, right 05/05/2016  . Pharyngitis 05/05/2016     Past Medical History:  Diagnosis Date  . CAP (community acquired pneumonia) 03/31/2017  . COPD (chronic obstructive pulmonary disease) ([place])   . Hypertension      Past Surgical History:  Procedure Laterality Date  . HERNIA REPAIR    . HERNIA REPAIR  1966     Family History  Problem Relation Age of Onset  . Lymphoma Father   . Cancer Mother        breast  . Colon cancer Neg Hx   . Rectal cancer Neg Hx   . Stomach cancer Neg Hx      Social History   Substance and Sexual Activity  Drug Use Not Currently  . Frequency: 2.0 times  per week  . Types: Marijuana  ,  Social History   Substance and Sexual Activity  Alcohol Use None   Comment: 7 cans of beer a week.  ,  Social History   Tobacco Use  Smoking Status Former Smoker  . Packs/day: 1.00  . Types: Cigarettes  . Quit date: 03/30/2017  . Years since quitting: 2.2  Smokeless Tobacco Never Used  ,    Current Outpatient Medications on File Prior to Visit  Medication Sig Dispense Refill  . lisinopril (ZESTRIL) 10 MG tablet Take 1 tablet (10 mg total) by mouth daily. (Patient taking differently: Take 20 mg by mouth daily. ) 90 tablet 1  . Vitamin D, Ergocalciferol, (DRISDOL) 1.25 MG (50000 UT) CAPS capsule Take 1 capsule (50,000 Units total) by mouth every 7 (seven) days. 16 capsule 0   No current facility-administered medications on file prior to visit.      No Known Allergies   Review of Systems:    A fourteen system review of systems was performed and found to be positive as per HPI.   Objective:    Blood pressure (!) 175/83, pulse 97, temperature 97.7 F (36.5 C), temperature source Oral, height 5' 5.25" (1.657 m), weight 185 lb 9.6 oz (84.2 kg), SpO2 98 %.  Body mass index is 30.65 kg/m.  General: Well Developed, well nourished, and in no acute distress.  HEENT:  Normocephalic, atraumatic, pupils equal round reactive to light, neck supple, no JVD. Skin: Warm and dry, cap RF less 2 sec Cardiac: Regular rate and rhythm, S1, S2 WNL's, no murmurs rubs or gallops Respiratory: ECTA B/L, Not using accessory muscles, speaking in full sentences. NeuroM-Sk: Ambulates w/o assistance, moves ext * 4 w/o difficulty, sensation grossly intact.  Ext: No edema noted. Psych: No HI/SI, judgement and insight good, Euthymic mood. Full Affect.

## 2019-07-17 LAB — LIPID PANEL
Chol/HDL Ratio: 4.2 ratio (ref 0.0–5.0)
Cholesterol, Total: 204 mg/dL — ABNORMAL HIGH (ref 100–199)
HDL: 49 mg/dL (ref >39)
LDL Chol Calc (NIH): 135 mg/dL — ABNORMAL HIGH (ref 0–99)
Triglycerides: 114 mg/dL (ref 0–149)
VLDL Cholesterol Cal: 20 mg/dL (ref 5–40)

## 2019-07-17 LAB — COMPREHENSIVE METABOLIC PANEL
ALT: 60 IU/L — ABNORMAL HIGH (ref 0–44)
AST: 42 IU/L — ABNORMAL HIGH (ref 0–40)
Albumin/Globulin Ratio: 1.6 (ref 1.2–2.2)
Albumin: 4.5 g/dL (ref 3.8–4.9)
Alkaline Phosphatase: 96 IU/L (ref 39–117)
BUN/Creatinine Ratio: 13 (ref 9–20)
BUN: 12 mg/dL (ref 6–24)
Bilirubin Total: 0.4 mg/dL (ref 0.0–1.2)
CO2: 22 mmol/L (ref 20–29)
Calcium: 9.9 mg/dL (ref 8.7–10.2)
Chloride: 100 mmol/L (ref 96–106)
Creatinine, Ser: 0.94 mg/dL (ref 0.76–1.27)
GFR calc Af Amer: 103 mL/min/{1.73_m2} (ref >59)
GFR calc non Af Amer: 89 mL/min/{1.73_m2} (ref >59)
Globulin, Total: 2.8 g/dL (ref 1.5–4.5)
Glucose: 108 mg/dL — ABNORMAL HIGH (ref 65–99)
Potassium: 4.8 mmol/L (ref 3.5–5.2)
Sodium: 138 mmol/L (ref 134–144)
Total Protein: 7.3 g/dL (ref 6.0–8.5)

## 2019-07-17 LAB — CBC WITH DIFFERENTIAL/PLATELET
Basophils Absolute: 0.1 10*3/uL (ref 0.0–0.2)
Basos: 1 %
EOS (ABSOLUTE): 0.2 10*3/uL (ref 0.0–0.4)
Eos: 3 %
Hematocrit: 51.7 % — ABNORMAL HIGH (ref 37.5–51.0)
Hemoglobin: 17.4 g/dL (ref 13.0–17.7)
Immature Grans (Abs): 0 10*3/uL (ref 0.0–0.1)
Immature Granulocytes: 1 %
Lymphocytes Absolute: 1.1 10*3/uL (ref 0.7–3.1)
Lymphs: 17 %
MCH: 32.5 pg (ref 26.6–33.0)
MCHC: 33.7 g/dL (ref 31.5–35.7)
MCV: 97 fL (ref 79–97)
Monocytes Absolute: 0.9 10*3/uL (ref 0.1–0.9)
Monocytes: 14 %
Neutrophils Absolute: 4.3 10*3/uL (ref 1.4–7.0)
Neutrophils: 64 %
Platelets: 254 10*3/uL (ref 150–450)
RBC: 5.36 x10E6/uL (ref 4.14–5.80)
RDW: 12.6 % (ref 11.6–15.4)
WBC: 6.6 10*3/uL (ref 3.4–10.8)

## 2019-07-17 LAB — TSH: TSH: 1.36 u[IU]/mL (ref 0.450–4.500)

## 2019-07-18 ENCOUNTER — Telehealth: Payer: Self-pay | Admitting: Family Medicine

## 2019-07-18 MED ORDER — LISINOPRIL 10 MG PO TABS: 10.0000 mg | ORAL_TABLET | Freq: Every day | ORAL | 0 refills | Status: DC

## 2019-07-18 NOTE — Telephone Encounter (Signed)
I would like for him to keep taking the new medication, Ziac 2.5-6.25 mg and restart lisinopril 10 mg. Continue to take his blood pressure and heart rate and keep a log to bring at his follow-up appt. He should monitor for any hypotensive symptoms- dizziness, lightheadedness, syncope etc. or low Bp's and let us know if he does. BP goal is <135/80.  Thank you, Lorrene Reid, PA-C

## 2019-07-18 NOTE — Addendum Note (Signed)
Addended by: Mickel Crow on: 07/18/2019 03:36 PM   Modules accepted: Orders

## 2019-07-18 NOTE — Telephone Encounter (Signed)
Patient is requesting a call back from [facility] specifically about his new BP med that he was prescribed earlier this week. Please contact when available.

## 2019-07-18 NOTE — Telephone Encounter (Signed)
Pt states that the new BP med is not helping with BP.  This morning it was 179/105 P:82, yesterday after work it was 157/98.  Pt is wondering if he should go back to his previous medication.  Please advise.  Charyl Bigger, CMA

## 2019-07-18 NOTE — Telephone Encounter (Signed)
Patient is aware of the below and verbalized understanding. Patient request Lisinopril to be sent to [facility]. Sending in #90. AS, CMA

## 2019-07-24 ENCOUNTER — Telehealth: Payer: Self-pay | Admitting: Physician Assistant

## 2019-07-24 ENCOUNTER — Ambulatory Visit: Payer: BC Managed Care – PPO | Admitting: Physician Assistant

## 2019-07-24 NOTE — Telephone Encounter (Signed)
07/24/2019  Pt informed of dosage change in lisinopril, recommendations and red flag symptoms.  Advised pt to check BPs every AM for the next 2 days and call on 07/26/2019 with readings.  Pt expressed understanding and is agreeable.  Charyl Bigger, CMA

## 2019-07-24 NOTE — Telephone Encounter (Signed)
Patient called and left VM that his BP is still above goal range. He took his BP today and got 188/105. He is requesting a call back from clinic staff to discuss what he should do to help this. Please advise

## 2019-07-24 NOTE — Telephone Encounter (Signed)
Patient can increase dose of lisinopril to 20 mg and continue Ziac. Advise proper hydration and if starts having any of the symptoms mentioned above or worsening vision changes he should seek immediate medical care. Thank you.

## 2019-07-24 NOTE — Telephone Encounter (Signed)
Spoke with pt who denies any CP, SOB, arm pain/numbness/tingling or headaches.  The only symptoms that he is experiencing is fatigue and visual changes where things seem "brighter".  Please advise.  Charyl Bigger, CMA

## 2019-07-26 ENCOUNTER — Telehealth: Payer: Self-pay | Admitting: Physician Assistant

## 2019-07-26 NOTE — Telephone Encounter (Signed)
Patient called states was advised to call in today's  BP readings.  BP 175/99 today & yesterday it was the same (even after he increase BP meds as advised)  --Forwarding message to med asst to contact with any further instructions. [phone].  --glh

## 2019-07-29 ENCOUNTER — Telehealth: Payer: Self-pay | Admitting: Physician Assistant

## 2019-07-29 DIAGNOSIS — R5383 Other fatigue: Secondary | ICD-10-CM

## 2019-07-29 DIAGNOSIS — I10 Essential (primary) hypertension: Secondary | ICD-10-CM

## 2019-07-29 DIAGNOSIS — R0683 Snoring: Secondary | ICD-10-CM

## 2019-07-29 MED ORDER — CHLORTHALIDONE 25 MG PO TABS: 25.0000 mg | ORAL_TABLET | Freq: Every day | ORAL | 1 refills | Status: DC

## 2019-07-29 NOTE — Telephone Encounter (Signed)
Left message for patient to call office back in regards to message below. AS, CMA

## 2019-07-29 NOTE — Telephone Encounter (Signed)
Patient called states was asked to call in BP readings   Fri ---- 178/105 Sat ---- 178/105 Sun----  169/96 Today--- 175/105  --Forwarding message to med asst.  --glh

## 2019-07-29 NOTE — Telephone Encounter (Signed)
Patient states he is still having issues with his vision and fatigue. Patient states that even resting (middle of the night BP reading 140s/90s.) Patient states he is taking both the Ziac as prescribed and the Lisinopril 20mg  QD.   Patient requesting some advise/ change to meds to help with BP. States he has been having issue with BO x 1 months. AS, CMA

## 2019-07-30 ENCOUNTER — Ambulatory Visit: Payer: 59 | Admitting: Family Medicine

## 2019-07-30 NOTE — Telephone Encounter (Signed)
Patient is aware of the below and verbalized understanding. Patient states that he is having constant fatigue and that yes, he snores. He wonders if he should have a sleep study.   Patient denies any SOB, chest pain or tightness, heart palpitations, dizziness.   Patient verbalized understanding concerning medication changes and agrees to continue to check BP and call tomorrow afternoon if BP continues to be this high. Patient states BP this morning is 184/105.  Patient is aware to seek emergent care should symptoms worsen or should he develop SOB, chest pain, chest tightness, palpitations or dizziness.

## 2019-07-30 NOTE — Telephone Encounter (Signed)
Patient is aware of the referral and verbalized understanding. AS, CMA

## 2019-08-01 ENCOUNTER — Other Ambulatory Visit: Payer: Self-pay | Admitting: Physician Assistant

## 2019-08-01 ENCOUNTER — Telehealth: Payer: Self-pay | Admitting: Physician Assistant

## 2019-08-01 DIAGNOSIS — I10 Essential (primary) hypertension: Secondary | ICD-10-CM

## 2019-08-01 MED ORDER — BISOPROLOL-HYDROCHLOROTHIAZIDE 2.5-6.25 MG PO TABS: 1.0000 | ORAL_TABLET | Freq: Every day | ORAL | 0 refills | Status: DC

## 2019-08-01 MED ORDER — LISINOPRIL 10 MG PO TABS: 20.0000 mg | ORAL_TABLET | Freq: Every day | ORAL | 0 refills | Status: DC

## 2019-08-01 NOTE — Telephone Encounter (Signed)
Patient calling stating his BP readings are not much better. Readings below.  Tuesday: 185/104 Wednesday: 176/103 Thursday: 175/106  Patient very concerned about BP readings and would like advise. AS, CMA

## 2019-08-01 NOTE — Telephone Encounter (Signed)
Spoke with Helyn App who advised for patient to restart Ziac along with Lisinopril and Chlorthalidone.   Patient will be taking:  Ziac 2.5-6.25mg  1 PO QD Lisinopril 20mg  1 POQD Chlorthalidone 25mg  1 POQD  Per [facility] referral to Cardiology for Hypertension.  Referral being placed.    Patient is aware of the above and verbalized understanding. Patient advised to call back tomorrow morning before 10am and let us know how his BP is.  Patient denies any SOB, chest pain or tightness, L arm pain, neck pain, jaw pain or dizziness.  AS, CMA

## 2019-08-01 NOTE — Telephone Encounter (Signed)
Patient was advised to drink half of body weight in oz of water daily due to diuretics. AS, CMA

## 2019-08-02 ENCOUNTER — Telehealth: Payer: Self-pay | Admitting: Physician Assistant

## 2019-08-02 NOTE — Telephone Encounter (Signed)
Patient calling this morning with BP ready 176/104. This is with taking all 3 meds: Ziac, Lisinopril 20, Chlorthaidone.   Patient states he took this BP right after waking up.   He denies any of the following sx: SOB, Dizziness, Chest pain or tightness, Neck or jaw pain, Arm pain.   Patient states he does feel Fatigue.   AS, CMA

## 2019-08-02 NOTE — Telephone Encounter (Signed)
Patient called to speak with [employer] and give updated BP information. Matthew [name] was unavailable at time of call working with another patient, please contact patient when available.

## 2019-08-02 NOTE — Telephone Encounter (Signed)
Spoke with Herb Grays who advised patient should increase Chlorthalidone to 50mg  QD and start taking Lisinopril at bedtime. She is emailing Dr. Marisue Ivan -cardiologist that patient has upcoming apt with to see if patient can be seen sooner.  Patient has been advised to continue Ziac as before but to increase Chlorthalidone from 25mg  to 50mg  and to take Lisinopril before bed now instead of in am with Ziac and Chlorthalidone. Patient advised to drink half of body weight in ounces of water daily to stay well hydrated.  Patient advised we are trying to get sooner apt with Cards team for HTN.  Patient advised to call our office Monday with BP log that he keeps over the weekend.   Patient advised that he should call EMS or go to ED for ANY of the following symptoms: Chest pain, chest tightness, SOB, Dizziness, neck or jaw pain, L arm pain.   Patient verbalized understanding of the above with no questions. AS, CMA

## 2019-08-06 ENCOUNTER — Ambulatory Visit (INDEPENDENT_AMBULATORY_CARE_PROVIDER_SITE_OTHER): Payer: BC Managed Care – PPO | Admitting: Cardiology

## 2019-08-06 ENCOUNTER — Encounter: Payer: Self-pay | Admitting: Physician Assistant

## 2019-08-06 ENCOUNTER — Encounter: Payer: Self-pay | Admitting: Cardiology

## 2019-08-06 ENCOUNTER — Other Ambulatory Visit: Payer: Self-pay

## 2019-08-06 VITALS — BP 128/82 | HR 73 | Ht 65.25 in | Wt 178.0 lb

## 2019-08-06 DIAGNOSIS — I10 Essential (primary) hypertension: Secondary | ICD-10-CM

## 2019-08-06 DIAGNOSIS — E785 Hyperlipidemia, unspecified: Secondary | ICD-10-CM | POA: Diagnosis not present

## 2019-08-06 MED ORDER — METOPROLOL SUCCINATE ER 25 MG PO TB24: 25.0000 mg | ORAL_TABLET | Freq: Every day | ORAL | 3 refills | Status: DC

## 2019-08-06 NOTE — Patient Instructions (Signed)
Medication Instructions:  Your physician has recommended you make the following change in your medication:  1) STOP taking Ziac 2) START taking Toprol 25 mg daily   *If you need a refill on your cardiac medications before your next appointment, please call your pharmacy*   Testing/Procedures: Your provider has requested that you have a Calcium Score CT scan.    Follow-Up: At [facility], you and your health needs are our priority.  As part of our continuing mission to provide you with exceptional heart care, we have created designated Provider Care Teams.  These Care Teams include your primary Cardiologist (physician) and Advanced Practice Providers (APPs -  Physician Assistants and Nurse Practitioners) who all work together to provide you with the care you need, when you need it.  Your next appointment:   3 month(s)  The format for your next appointment:   In Person  Provider:   Fransico Him, MD   Other Instructions Please take you blood pressure daily for one week and call us with your readings.

## 2019-08-06 NOTE — Progress Notes (Signed)
Cardiology Office Note    Date:  08/06/2019   ID:  [name], DOB 12-Dec-1960, MRN [id number]  PCP:  Lorrene Reid, PA-C  Cardiologist:  Fransico Him, MD   Chief Complaint  Patient presents with  . Hypertension    History of Present Illness:  Matthew [name] is a 59 y.o. male who is being seen today for the evaluation of HTN at the request of Lorrene Reid, PA-C.  This is a 59yo male with a hx of COPD and HTN which has been difficult to control and is now referred by his PCP for evaluation.      Past Medical History:  Diagnosis Date  . Acute bronchitis 03/30/2017  . Acute maxillary sinusitis 07/25/2017  . Acute otitis media, right 05/05/2016  . Acute respiratory failure with hypoxia ([place]) 03/30/2017  . CAP (community acquired pneumonia) 03/31/2017  . COPD (chronic obstructive pulmonary disease) ([place])   . Diarrhea 10/16/2017  . Diverticulitis   . Ear build-up, left 08/10/2017  . Hypertension   . Left otitis media 07/17/2017  . Pharyngitis 05/05/2016  . Sigmoid diverticulitis 02/11/2018    Past Surgical History:  Procedure Laterality Date  . HERNIA REPAIR    . HERNIA REPAIR  1966    Current Medications: Current Meds  Medication Sig  . chlorthalidone (HYGROTON) 25 MG tablet Take 1 tablet (25 mg total) by mouth daily. (Patient taking differently: Take 50 mg by mouth daily. )  . famotidine-calcium carbonate-magnesium hydroxide (PEPCID COMPLETE) 10-800-165 MG chewable tablet Chew 1 tablet by mouth daily as needed.  Marland Kitchen lisinopril (ZESTRIL) 10 MG tablet Take 2 tablets (20 mg total) by mouth daily.  Marland Kitchen MELATONIN PO Take by mouth as needed (sleep).  . Vitamin D, Ergocalciferol, (DRISDOL) 1.25 MG (50000 UT) CAPS capsule Take 1 capsule (50,000 Units total) by mouth every 7 (seven) days.  . [DISCONTINUED] bisoprolol-hydrochlorothiazide (ZIAC) 2.5-6.25 MG tablet Take 1 tablet by mouth daily.    Allergies:   Patient has no known allergies.   Social History   Socioeconomic  History  . Marital status: Widowed    Spouse name: Not on file  . Number of children: 1  . Years of education: Not on file  . Highest education level: Not on file  Occupational History  . Not on file  Tobacco Use  . Smoking status: Former Smoker    Packs/day: 1.00    Types: Cigarettes    Quit date: 03/30/2017    Years since quitting: 2.3  . Smokeless tobacco: Never Used  Substance and Sexual Activity  . Alcohol use: Not on file    Comment: 7 cans of beer a week.  . Drug use: Not Currently    Frequency: 2.0 times per week    Types: Marijuana  . Sexual activity: Yes    Birth control/protection: None  Other Topics Concern  . Not on file  Social History Narrative  . Not on file   Social Determinants of Health   Financial Resource Strain:   . Difficulty of Paying Living Expenses:   Food Insecurity:   . Worried About Charity fundraiser in the Last Year:   . Arboriculturist in the Last Year:   Transportation Needs:   . Film/video editor (Medical):   Marland Kitchen Lack of Transportation (Non-Medical):   Physical Activity:   . Days of Exercise per Week:   . Minutes of Exercise per Session:   Stress:   . Feeling of Stress :  Social Connections:   . Frequency of Communication with Friends and Family:   . Frequency of Social Gatherings with Friends and Family:   . Attends Religious Services:   . Active Member of Clubs or Organizations:   . Attends Archivist Meetings:   Marland Kitchen Marital Status:      Family History:  The patient's family history includes Cancer in his mother; Lymphoma in his father.   ROS:   Please see the history of present illness.    ROS All other systems reviewed and are negative.  No flowsheet data found.     PHYSICAL EXAM:   VS:  BP 128/82   Pulse 73   Ht 5' 5.25" (1.657 m)   Wt 178 lb (80.7 kg)   BMI 29.39 kg/m    GEN: Well nourished, well developed, in no acute distress  HEENT: normal  Neck: no JVD, carotid bruits, or masses Cardiac:  RRR; no murmurs, rubs, or gallops,no edema.  Intact distal pulses bilaterally.  Respiratory:  clear to auscultation bilaterally, normal work of breathing GI: soft, nontender, nondistended, + BS MS: no deformity or atrophy  Skin: warm and dry, no rash Neuro:  Alert and Oriented x 3, Strength and sensation are intact Psych: euthymic mood, full affect  Wt Readings from Last 3 Encounters:  08/06/19 178 lb (80.7 kg)  07/16/19 185 lb 9.6 oz (84.2 kg)  04/30/19 186 lb 6.4 oz (84.6 kg)      Studies/Labs Reviewed:   EKG:  EKG is ordered today.  The ekg ordered today demonstrates NSR with no ST changes  Recent Labs: 07/16/2019: ALT 60; BUN 12; Creatinine, Ser 0.94; Hemoglobin 17.4; Platelets 254; Potassium 4.8; Sodium 138; TSH 1.360   Lipid Panel    Component Value Date/Time   CHOL 204 (H) 07/16/2019 0858   TRIG 114 07/16/2019 0858   HDL 49 07/16/2019 0858   CHOLHDL 4.2 07/16/2019 0858   LDLCALC 135 (H) 07/16/2019 0858    Additional studies/ records that were reviewed today include:  Office notes from PCP    ASSESSMENT:    1. Essential hypertension   2. Dyslipidemia      PLAN:  In order of problems listed above:  1. HTN -BP is well controlled on exam today -continue  Lisinopril  20mg  daily -stop Ziac -continue Chlorthalidone 50mg  daily -start Toprol XL 25mg  daily -I have asked him to check his BP daily for a week and call with results -he will see me back in 3 months  2.  Dyslipidemia -he was told this was high at one point but now diet controlled although his last HLD was low at 19 -given his CRFs including remote tobacco use, dyslipidemia with low HDL and HTN, I have recommended a coronary calcium score to assess future cardiac risk    Medication Adjustments/Labs and Tests Ordered: Current medicines are reviewed at length with the patient today.  Concerns regarding medicines are outlined above.  Medication changes, Labs and Tests ordered today are listed in the  Patient Instructions below.  There are no Patient Instructions on file for this visit.   Signed, Fransico Him, MD  08/06/2019 9:09 AM    [facility] [place], [place], [place]  [address] Phone: [phone]; Fax: [phone]

## 2019-08-08 ENCOUNTER — Ambulatory Visit (INDEPENDENT_AMBULATORY_CARE_PROVIDER_SITE_OTHER): Payer: BC Managed Care – PPO | Admitting: Neurology

## 2019-08-08 ENCOUNTER — Ambulatory Visit (INDEPENDENT_AMBULATORY_CARE_PROVIDER_SITE_OTHER)
Admission: RE | Admit: 2019-08-08 | Discharge: 2019-08-08 | Disposition: A | Discharge status: Home / Self Care | Payer: Self-pay | Source: Ambulatory Visit | Attending: Cardiology | Admitting: Cardiology

## 2019-08-08 ENCOUNTER — Encounter: Payer: Self-pay | Admitting: Neurology

## 2019-08-08 ENCOUNTER — Other Ambulatory Visit: Payer: Self-pay

## 2019-08-08 VITALS — BP 132/80 | HR 62 | Temp 97.2°F | Ht 63.0 in | Wt 178.0 lb

## 2019-08-08 DIAGNOSIS — R0683 Snoring: Secondary | ICD-10-CM | POA: Diagnosis not present

## 2019-08-08 DIAGNOSIS — E785 Hyperlipidemia, unspecified: Secondary | ICD-10-CM

## 2019-08-08 DIAGNOSIS — I158 Other secondary hypertension: Secondary | ICD-10-CM

## 2019-08-08 DIAGNOSIS — J9601 Acute respiratory failure with hypoxia: Secondary | ICD-10-CM

## 2019-08-08 DIAGNOSIS — G4719 Other hypersomnia: Secondary | ICD-10-CM | POA: Diagnosis not present

## 2019-08-08 DIAGNOSIS — R5383 Other fatigue: Secondary | ICD-10-CM | POA: Insufficient documentation

## 2019-08-08 DIAGNOSIS — I10 Essential (primary) hypertension: Secondary | ICD-10-CM

## 2019-08-08 NOTE — Progress Notes (Signed)
SLEEP MEDICINE CLINIC    Provider:  Larey Seat, MD  Primary Care Physician:  Lorrene Reid, PA-C [place] [address]     Referring Provider: Lorrene Reid, [address] [place],   [id number]          Chief Complaint according to patient   Patient presents with:    . New Patient (Initial Visit)     pt alone, rm 11. presents today for complaints of having elevated BP in the monring. He has  been told he snores and cardio MD's want him to be evaluated for OSA. He never had a SS      HISTORY OF PRESENT ILLNESS:  Matthew [name] is a 59 year- old Caucasian male patient seen here on 08/08/2019.    Chief concern according to patient : [name]. Stum is seen here today as a new patient to my practice he has just had a new primary care PA assigned and a concern of elevated blood pressures in the morning, sometimes associated with tinnitus or dizziness or headaches has been raised.  Since these are symptoms that seem to be worse in the morning of relation to sleep apnea is possible.  The patient also feels that he has not the same energy level and he feels more sleepy in daytime.  The patient controlled his blood pressure regularly at home and systolic blood pressures range from 140s to 035D and diastolics from 97-416.  Pulse rate is also elevated.  He reports photophobia in the morning. He drinks alcohol, bourbon, wine, beer and feels the related relaxation helps to lower BP. He went to [facility] on [phone] and was worked up for stroke but nothing else was followed up on.  The day before he saw his primary care provider he had reportedly felt fuzzy and slightly off balance which resolved after 15 minutes.   I have the pleasure of seeing Matthew [name] today, a right-handed Caucasian male and former smoker, with a possible sleep disorder. He   has a past medical history of Acute bronchitis (03/30/2017), Acute maxillary  sinusitis (07/25/2017), Acute otitis media, right (05/05/2016), Acute respiratory failure with hypoxia ([place]) (03/30/2017), CAP (community acquired pneumonia) (03/31/2017), COPD (chronic obstructive pulmonary disease) ([place]), Diarrhea (10/16/2017), Diverticulitis, Ear build-up, left (08/10/2017), Hypertension, Left otitis media (07/17/2017), Pharyngitis (05/05/2016), and Sigmoid diverticulitis (02/11/2018).   Sleep relevant medical history: high morning blood pressure .   Family medical /sleep history: no  other family member on CPAP with OSA, insomnia, sleep walkers.    Social history:  Patient is working as a Emergency planning/management officer and lives in a household  alone. Family status is widowed  with adult children his daughter is an ICU nurse.  The patient currently works by [employer]. Pets are not present. Tobacco use - quit 03-2017 .  ETOH use - yes , up to 3 a week end day, averaging 7-10 a week.  Caffeine intake in form of Coffee( 1 cup in ) Soda( dr pepper 3/ week) Tea ( none ) or energy drinks. Regular exercise in form of his job.    Hobbies : none    Sleep habits are as follows: The patient's dinner time is between 7-8 PM. The patient goes to bed at 11 PM and continues to sleep for 2-3  hours, wakes for a bathroom breaks, the first time at 3.30 AM.   He then will wake up every  hour and looks at the clock-  The preferred sleep position is variable, just not prone, with the support of 1 pillow.  Dreams are reportedly rare. He sleeps on average 5 hours, he thinks.  The patient wakes up with an alarm, 7.30  AM is the usual rise time.  He reports not feeling refreshed or restored in AM, with symptoms such as dry mouth, morning headaches and dizziness, light headedness- residual fatigue. Naps are taken frequently, lasting from 20-30 minutes and are not more refreshing than nocturnal sleep.    Review of Systems: Out of a complete 14 system review, the patient complains of only the following symptoms, and all other  reviewed systems are negative.:    Fatigue, sleepiness , snoring, fragmented sleep, Insomnia -   How likely are you to doze in the following situations: 0 = not likely, 1 = slight chance, 2 = moderate chance, 3 = high chance   Sitting and Reading? Watching Television? Sitting inactive in a public place (theater or meeting)? As a passenger in a car for an hour without a break? Lying down in the afternoon when circumstances permit? Sitting and talking to someone? Sitting quietly after lunch without alcohol? In a car, while stopped for a few minutes in traffic?   Total = 17/ 24 points   FSS endorsed at 40/ 63 points.   Social History   Socioeconomic History  . Marital status: Widowed    Spouse name: Not on file  . Number of children: 1  . Years of education: Not on file  . Highest education level: Not on file  Occupational History  . Not on file  Tobacco Use  . Smoking status: Former Smoker    Packs/day: 1.00    Types: Cigarettes    Quit date: 03/30/2017    Years since quitting: 2.3  . Smokeless tobacco: Never Used  Substance and Sexual Activity  . Alcohol use: Not on file    Comment: 7 cans of beer a week.  . Drug use: Not Currently    Frequency: 2.0 times per week    Types: Marijuana  . Sexual activity: Yes    Birth control/protection: None  Other Topics Concern  . Not on file  Social History Narrative  . Not on file   Social Determinants of Health   Financial Resource Strain:   . Difficulty of Paying Living Expenses:   Food Insecurity:   . Worried About Charity fundraiser in the Last Year:   . Arboriculturist in the Last Year:   Transportation Needs:   . Film/video editor (Medical):   Marland Kitchen Lack of Transportation (Non-Medical):   Physical Activity:   . Days of Exercise per Week:   . Minutes of Exercise per Session:   Stress:   . Feeling of Stress :   Social Connections:   . Frequency of Communication with Friends and Family:   . Frequency of Social  Gatherings with Friends and Family:   . Attends Religious Services:   . Active Member of Clubs or Organizations:   . Attends Archivist Meetings:   Marland Kitchen Marital Status:     Family History  Problem Relation Age of Onset  . Lymphoma Father   . Cancer Mother        breast  . Colon cancer Neg Hx   . Rectal cancer Neg Hx   . Stomach cancer Neg Hx     Past Medical History:  Diagnosis Date  .  Acute bronchitis 03/30/2017  . Acute maxillary sinusitis 07/25/2017  . Acute otitis media, right 05/05/2016  . Acute respiratory failure with hypoxia ([place]) 03/30/2017  . CAP (community acquired pneumonia) 03/31/2017  . COPD (chronic obstructive pulmonary disease) ([place])   . Diarrhea 10/16/2017  . Diverticulitis   . Ear build-up, left 08/10/2017  . Hypertension   . Left otitis media 07/17/2017  . Pharyngitis 05/05/2016  . Sigmoid diverticulitis 02/11/2018    Past Surgical History:  Procedure Laterality Date  . HERNIA REPAIR    . HERNIA REPAIR  1966     Current Outpatient Medications on File Prior to Visit  Medication Sig Dispense Refill  . chlorthalidone (HYGROTON) 25 MG tablet Take 1 tablet (25 mg total) by mouth daily. (Patient taking differently: Take 50 mg by mouth daily. ) 30 tablet 1  . famotidine-calcium carbonate-magnesium hydroxide (PEPCID COMPLETE) 10-800-165 MG chewable tablet Chew 1 tablet by mouth daily as needed.    Marland Kitchen lisinopril (ZESTRIL) 10 MG tablet Take 2 tablets (20 mg total) by mouth daily. 90 tablet 0  . MELATONIN PO Take by mouth as needed (sleep).    . metoprolol succinate (TOPROL XL) 25 MG 24 hr tablet Take 1 tablet (25 mg total) by mouth daily. 90 tablet 3  . Vitamin D, Ergocalciferol, (DRISDOL) 1.25 MG (50000 UT) CAPS capsule Take 1 capsule (50,000 Units total) by mouth every 7 (seven) days. 16 capsule 0   No current facility-administered medications on file prior to visit.    No Known Allergies  Physical exam:  Today's Vitals   08/08/19 1008  BP: 132/80   Pulse: 62  Temp: (!) 97.2 F (36.2 C)  Weight: 178 lb (80.7 kg)  Height: 5\' 3"  (1.6 m)   Body mass index is 31.53 kg/m.   Wt Readings from Last 3 Encounters:  08/08/19 178 lb (80.7 kg)  08/06/19 178 lb (80.7 kg)  07/16/19 185 lb 9.6 oz (84.2 kg)     Ht Readings from Last 3 Encounters:  08/08/19 5\' 3"  (1.6 m)  08/06/19 5' 5.25" (1.657 m)  07/16/19 5' 5.25" (1.657 m)      General: The patient is awake, alert and appears not in acute distress. The patient is groomed. Some facial redness, sun exposure ? Head: Normocephalic, atraumatic. Neck is supple. Mallampati  2, redness here too.  neck circumference : 16.5  inches . Nasal airflow  patent.  Retrognathia is  seen.  Dental status: bridges.  Cardiovascular:  Regular rate and cardiac rhythm by pulse,  without distended neck veins. Respiratory: Lungs are clear to auscultation.  Skin:  Without evidence of ankle edema, or rash. Trunk: The patient's posture is erect.   Neurologic exam : The patient is awake and alert, oriented to place and time.   Memory subjective described as intact.  Attention span & concentration ability appears normal.  Speech is fluent,  without  dysarthria, dysphonia or aphasia.  Mood and affect are appropriate.   Cranial nerves: no loss of smell or taste reported  Pupils are equal and briskly reactive to light. Funduscopic exam deferred. .  Extraocular movements in vertical and horizontal planes were intact and without nystagmus. No Diplopia. Visual fields by finger perimetry are intact. Hearing was intact to soft voice and finger rubbing.    Facial sensation intact to fine touch.  Facial motor strength is symmetric and tongue and uvula move midline.  Neck ROM : rotation, tilt and flexion extension were normal for age and shoulder shrug was symmetrical.  Motor exam:  Symmetric bulk, tone and ROM.   Normal tone without cog wheeling, symmetric grip strength . Sensory:  Fine touch, pinprick and  vibration were tested  and  normal.  Proprioception tested in the upper extremities was normal.  Coordination: Rapid alternating movements in the fingers/hands were of normal speed.  The Finger-to-nose maneuver was intact without evidence of ataxia, dysmetria but action tremor. No resting tremor.  Gait and station: Patient could rise unassisted from a seated position, walked without assistive device.  Stance is of normal width/ base . Toe and heel walk were deferred.  Deep tendon reflexes: in the  upper and lower extremities are symmetric and intact.  Babinski response was deferred.    I reviewed the most recent lab results and AST and ALT were both elevated and fasting glucose was too high. Hematocrit 51.7%    After spending a total time of 40 minutes face to face and additional time for physical and neurologic examination, review of laboratory studies,  personal review of imaging studies, reports and results of other testing and review of referral information / records as far as provided in visit, I have established the following assessments:  1) high risk for OSA- in the setting of likely ETOH related HTN, morning lightheadedness- headaches. LFTs indicate ongoing irritation of the liver ,leading to dehydration and aldehyde headaches.  2) Apnea can be induced by alcohol and can be worsened.    My Plan is to proceed with:  1)HST as a screening test for OSA, and OSA being a contributor to HTN.  2) Reduce alcohol intake to 5 drinks a week- and drink only with food and 1 glass of water.    I would like to thank Lorrene Reid, PA-C and Lorrene Reid, [place],  [place] [id number] for allowing me to meet with and to take care of this pleasant patient.   In short, Matthew [name] is presenting with morning high BP. I plan to follow up either personally or through our NP within 2-3 month.   CC: I will share my notes with PA Abonza   Electronically signed  by: Larey Seat, MD 08/08/2019 10:32 AM  [facility] Board certified by The [employer] of Sleep Medicine and Diplomate of the [employer] of Sleep Medicine. Board certified In Neurology through the [place], Fellow of the [employer] of Neurology. Medical Director of [facility].

## 2019-08-08 NOTE — Patient Instructions (Signed)
Screening for Sleep Apnea  Sleep apnea is a condition in which breathing pauses or becomes shallow during sleep. Sleep apnea screening is a test to determine if you are at risk for sleep apnea. The test is easy and only takes a few minutes. Your health care provider may ask you to have this test in preparation for surgery or as part of a physical exam. What are the symptoms of sleep apnea? Common symptoms of sleep apnea include:  Snoring.  Restless sleep.  Daytime sleepiness.  Pauses in breathing.  Choking during sleep.  Irritability.  Forgetfulness.  Trouble thinking clearly.  Depression.  Personality changes. Most people with sleep apnea are not aware that they have it. Why should I get screened? Getting screened for sleep apnea can help:  Ensure your safety. It is important for your health care providers to know whether or not you have sleep apnea, especially if you are having surgery or have other long-term (chronic) health conditions.  Improve your health and allow you to get a better night's rest. Restful sleep can help you: ? Have more energy. ? Lose weight. ? Improve high blood pressure. ? Improve diabetes management. ? Prevent stroke. ? Prevent car accidents. How is screening done? Screening usually includes being asked a list of questions about your sleep quality. Some questions you may be asked include:  Do you snore?  Is your sleep restless?  Do you have daytime sleepiness?  Has a partner or spouse told you that you stop breathing during sleep?  Have you had trouble concentrating or memory loss? If your screening test is positive, you are at risk for the condition. Further testing may be needed to confirm a diagnosis of sleep apnea. Where to find more information You can find screening tools online or at your health care clinic. For more information about sleep apnea screening and healthy sleep, visit these websites:  Centers for Disease Control and  Prevention: www.cdc.gov/sleep/index.html  American Sleep Apnea Association: www.sleepapnea.org Contact a health care provider if:  You think that you may have sleep apnea. Summary  Sleep apnea screening can help determine if you are at risk for sleep apnea.  It is important for your health care providers to know whether or not you have sleep apnea, especially if you are having surgery or have other chronic health conditions.  You may be asked to take a screening test for sleep apnea in preparation for surgery or as part of a physical exam. This information is not intended to replace advice given to you by your health care provider. Make sure you discuss any questions you have with your health care provider. Document Revised: 12/29/2017 Document Reviewed: 06/24/2016 Elsevier Patient Education  2020 Elsevier Inc. Quality Sleep Information, Adult Quality sleep is important for your mental and physical health. It also improves your quality of life. Quality sleep means you:  Are asleep for most of the time you are in bed.  Fall asleep within 30 minutes.  Wake up no more than once a night.  Are awake for no longer than 20 minutes if you do wake up during the night. Most adults need 7-8 hours of quality sleep each night. How can poor sleep affect me? If you do not get enough quality sleep, you may have:  Mood swings.  Daytime sleepiness.  Confusion.  Decreased reaction time.  Sleep disorders, such as insomnia and sleep apnea.  Difficulty with: ? Solving problems. ? Coping with stress. ? Paying attention. These issues may   affect your performance and productivity at work, school, and at home. Lack of sleep may also put you at higher risk for accidents, suicide, and risky behaviors. If you do not get quality sleep you may also be at higher risk for several health problems, including:  Infections.  Type 2 diabetes.  Heart disease.  High blood  pressure.  Obesity.  Worsening of long-term conditions, like arthritis, kidney disease, depression, Parkinson's disease, and epilepsy. What actions can I take to get more quality sleep?      Stick to a sleep schedule. Go to sleep and wake up at about the same time each day. Do not try to sleep less on weekdays and make up for lost sleep on weekends. This does not work.  Try to get about 30 minutes of exercise on most days. Do not exercise 2-3 hours before going to bed.  Limit naps during the day to 30 minutes or less.  Do not use any products that contain nicotine or tobacco, such as cigarettes or e-cigarettes. If you need help quitting, ask your health care provider.  Do not drink caffeinated beverages for at least 8 hours before going to bed. Coffee, tea, and some sodas contain caffeine.  Do not drink alcohol close to bedtime.  Do not eat large meals close to bedtime.  Do not take naps in the late afternoon.  Try to get at least 30 minutes of sunlight every day. Morning sunlight is best.  Make time to relax before bed. Reading, listening to music, or taking a hot bath promotes quality sleep.  Make your bedroom a place that promotes quality sleep. Keep your bedroom dark, quiet, and at a comfortable room temperature. Make sure your bed is comfortable. Take out sleep distractions like TV, a computer, smartphone, and bright lights.  If you are lying awake in bed for longer than 20 minutes, get up and do a relaxing activity until you feel sleepy.  Work with your health care provider to treat medical conditions that may affect sleeping, such as: ? Nasal obstruction. ? Snoring. ? Sleep apnea and other sleep disorders.  Talk to your health care provider if you think any of your prescription medicines may cause you to have difficulty falling or staying asleep.  If you have sleep problems, talk with a sleep consultant. If you think you have a sleep disorder, talk with your health  care provider about getting evaluated by a specialist. Where to find more information  National Sleep Foundation website: https://sleepfoundation.org  National Heart, Lung, and Blood Institute (NHLBI): www.nhlbi.nih.gov/files/docs/public/sleep/healthy_sleep.pdf  Centers for Disease Control and Prevention (CDC): www.cdc.gov/sleep/index.html Contact a health care provider if you:  Have trouble getting to sleep or staying asleep.  Often wake up very early in the morning and cannot get back to sleep.  Have daytime sleepiness.  Have daytime sleep attacks of suddenly falling asleep and sudden muscle weakness (narcolepsy).  Have a tingling sensation in your legs with a strong urge to move your legs (restless legs syndrome).  Stop breathing briefly during sleep (sleep apnea).  Think you have a sleep disorder or are taking a medicine that is affecting your quality of sleep. Summary  Most adults need 7-8 hours of quality sleep each night.  Getting enough quality sleep is an important part of health and well-being.  Make your bedroom a place that promotes quality sleep and avoid things that may cause you to have poor sleep, such as alcohol, caffeine, smoking, and large meals.  Talk to   your health care provider if you have trouble falling asleep or staying asleep. This information is not intended to replace advice given to you by your health care provider. Make sure you discuss any questions you have with your health care provider. Document Revised: 06/21/2017 Document Reviewed: 06/21/2017 Elsevier Patient Education  2020 Elsevier Inc.  

## 2019-08-09 ENCOUNTER — Telehealth: Payer: Self-pay | Admitting: *Deleted

## 2019-08-09 DIAGNOSIS — E785 Hyperlipidemia, unspecified: Secondary | ICD-10-CM

## 2019-08-09 DIAGNOSIS — I700 Atherosclerosis of aorta: Secondary | ICD-10-CM

## 2019-08-09 DIAGNOSIS — R931 Abnormal findings on diagnostic imaging of heart and coronary circulation: Secondary | ICD-10-CM

## 2019-08-09 MED ORDER — ATORVASTATIN CALCIUM 40 MG PO TABS: 40.0000 mg | ORAL_TABLET | Freq: Every day | ORAL | 6 refills | Status: DC

## 2019-08-09 NOTE — Telephone Encounter (Signed)
Matthew Margarita, MD  08/08/2019 10:13 PM EDT    Coronary calcium score is high indicating increased risk of future cardiac events. LDL recently was 135 and goal is < 70. Please start Lipitor 40mg  daily and repeat FLP and ALT in 6 weeks. Please order a Lexiscan myoview to rule out ischemia

## 2019-08-09 NOTE — Telephone Encounter (Signed)
I spoke with patient and reviewed results with him. Will send prescription for Lipitor to [facility]. Patient will come in for fasting lab work on June 25,2021. I verbally went over instructions for lexiscan myoview with patient.  He is aware our office will contact him with appointment information.

## 2019-08-09 NOTE — Telephone Encounter (Signed)
-----   Message from Sueanne Margarita, MD sent at 08/08/2019  6:05 PM EDT ----- Chest CTA showed aortic atherosclerosis and fatty liver - forward to PCP - agree with PCP recommendations to start statin therapy - LDL goal < 70

## 2019-08-12 ENCOUNTER — Ambulatory Visit: Payer: BC Managed Care – PPO | Admitting: Cardiovascular Disease

## 2019-08-14 ENCOUNTER — Telehealth: Payer: Self-pay | Admitting: Cardiology

## 2019-08-14 ENCOUNTER — Encounter (HOSPITAL_COMMUNITY): Payer: Self-pay | Admitting: *Deleted

## 2019-08-14 ENCOUNTER — Telehealth (HOSPITAL_COMMUNITY): Payer: Self-pay | Admitting: *Deleted

## 2019-08-14 MED ORDER — METOPROLOL TARTRATE 50 MG PO TABS: 50.0000 mg | ORAL_TABLET | Freq: Every day | ORAL | 3 refills | Status: DC

## 2019-08-14 NOTE — Telephone Encounter (Signed)
Pt c/o BP issue: STAT if pt c/o blurred vision, one-sided weakness or slurred speech  1. What are your last 5 BP readings?  05-12: 115/87 HR 80 05-13: 132/81 HR 85 05-14: 135/87 HR 72 05-15: 146/92 HR 97 *~ 5 hrs post meds* 05-16: 138/83 HR 80 05-17: 138/86 HR 86 05-18: 119/83 HR 92 *~7:00 pm*   Unless otherwise noted, all readings were taken ~ 2 hrs after the pt took his medication. The pt normally takes his medication ~8 am    2. Are you having any other symptoms (ex. Dizziness, headache, blurred vision, passed out)? No   3. What is your BP issue? PT started taking a new medication after his visit with Dr. Radford Pax 05-11. The pt was told to document his BP after taking the medicine and report it back to Dr. Radford Pax

## 2019-08-14 NOTE — Telephone Encounter (Signed)
Stress test instructions sent via MyChart. Test scheduled for 08/19/19@ 10:15.

## 2019-08-14 NOTE — Telephone Encounter (Signed)
BP still a little on the high side as well as HR - increase Toprol XL to 50mg  daily and repeat BP and HR checks daily for a week and call

## 2019-08-14 NOTE — Telephone Encounter (Signed)
Spoke with the patient and he will increase Toprol to 50 mg daily. He will record his HR and BP for another week and call us again with those readings.

## 2019-08-19 ENCOUNTER — Other Ambulatory Visit: Payer: Self-pay

## 2019-08-19 ENCOUNTER — Ambulatory Visit (HOSPITAL_COMMUNITY): Payer: BC Managed Care – PPO | Attending: Cardiology

## 2019-08-19 DIAGNOSIS — I700 Atherosclerosis of aorta: Secondary | ICD-10-CM | POA: Diagnosis not present

## 2019-08-19 DIAGNOSIS — E785 Hyperlipidemia, unspecified: Secondary | ICD-10-CM | POA: Diagnosis not present

## 2019-08-19 DIAGNOSIS — R931 Abnormal findings on diagnostic imaging of heart and coronary circulation: Secondary | ICD-10-CM | POA: Diagnosis not present

## 2019-08-19 LAB — MYOCARDIAL PERFUSION IMAGING
LV dias vol: 81 mL (ref 62–150)
LV sys vol: 37 mL
Peak HR: 96 {beats}/min
Rest HR: 75 {beats}/min
SDS: 1
SRS: 0
SSS: 1
TID: 1.15

## 2019-08-19 MED ORDER — TECHNETIUM TC 99M TETROFOSMIN IV KIT
31.2000 | PACK | Freq: Once | INTRAVENOUS | Status: AC | PRN
  Administered 2019-08-19: 31.2 via INTRAVENOUS
  Filled 2019-08-19: qty 32

## 2019-08-19 MED ORDER — REGADENOSON 0.4 MG/5ML IV SOLN
0.4000 mg | Freq: Once | INTRAVENOUS | Status: AC
  Administered 2019-08-19: 0.4 mg via INTRAVENOUS

## 2019-08-19 MED ORDER — TECHNETIUM TC 99M TETROFOSMIN IV KIT
10.2000 | PACK | Freq: Once | INTRAVENOUS | Status: AC | PRN
  Administered 2019-08-19: 10.2 via INTRAVENOUS
  Filled 2019-08-19: qty 11

## 2019-08-21 ENCOUNTER — Other Ambulatory Visit: Payer: Self-pay | Admitting: Adult Health

## 2019-08-27 ENCOUNTER — Telehealth: Payer: Self-pay | Admitting: Cardiology

## 2019-08-27 ENCOUNTER — Encounter: Payer: Self-pay | Admitting: Neurology

## 2019-08-27 NOTE — Telephone Encounter (Signed)
Patient states he is returning a call from this morning. While still on the phone he looked at his messages and stated he was all set now for his sleep study and did not need a call back.

## 2019-08-28 ENCOUNTER — Ambulatory Visit (INDEPENDENT_AMBULATORY_CARE_PROVIDER_SITE_OTHER): Payer: BC Managed Care – PPO | Admitting: Neurology

## 2019-08-28 ENCOUNTER — Other Ambulatory Visit: Payer: Self-pay

## 2019-08-28 DIAGNOSIS — I10 Essential (primary) hypertension: Secondary | ICD-10-CM

## 2019-08-28 DIAGNOSIS — I158 Other secondary hypertension: Secondary | ICD-10-CM

## 2019-08-28 DIAGNOSIS — J209 Acute bronchitis, unspecified: Secondary | ICD-10-CM

## 2019-08-28 DIAGNOSIS — R0683 Snoring: Secondary | ICD-10-CM

## 2019-08-28 DIAGNOSIS — G4710 Hypersomnia, unspecified: Secondary | ICD-10-CM | POA: Diagnosis not present

## 2019-08-28 DIAGNOSIS — J189 Pneumonia, unspecified organism: Secondary | ICD-10-CM

## 2019-08-28 DIAGNOSIS — R5383 Other fatigue: Secondary | ICD-10-CM

## 2019-08-28 DIAGNOSIS — G4719 Other hypersomnia: Secondary | ICD-10-CM

## 2019-09-02 ENCOUNTER — Other Ambulatory Visit: Payer: Self-pay | Admitting: Physician Assistant

## 2019-09-02 IMAGING — CT CT ABD-PELV W/ CM
2 of 5 series · 17 of 46 positions shown, 19 images · IV contrast (ISOVUE)
Comparison: None.

CLINICAL DATA: Abdominal pain, nausea since colonoscopy.

EXAM:
CT ABDOMEN AND PELVIS WITH CONTRAST
TECHNIQUE: Multidetector CT imaging of the abdomen and pelvis was performed
using the standard protocol following bolus administration of
intravenous contrast.
CONTRAST:  100mL [id number] IOPAMIDOL ([id number]) INJECTION 61%

[Series 2: axial st · axial · 0.77mm/px · z∈[-459,-89]mm · 14 of 84 slices shown, 16 images]
[im 5/84  soft-tissue]
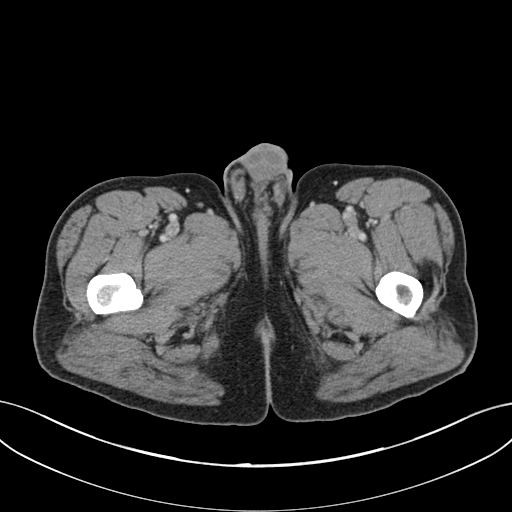
[im 5/84  bone]
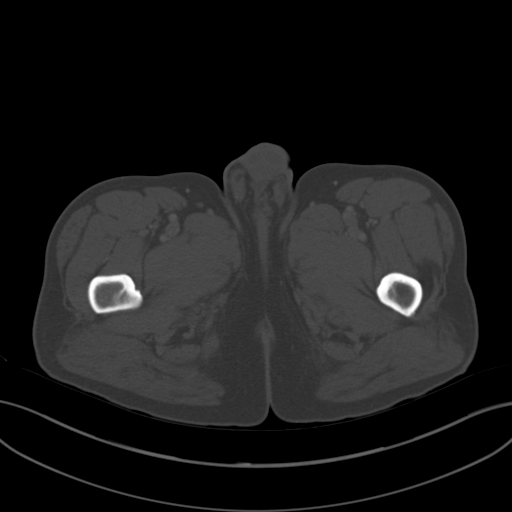
[im 10/84  soft-tissue]
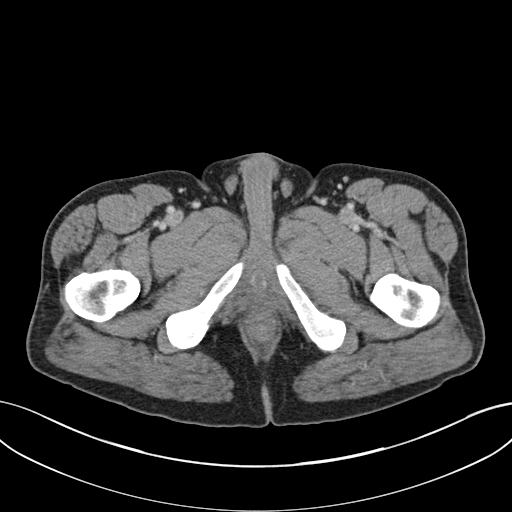
[im 15/84  soft-tissue]
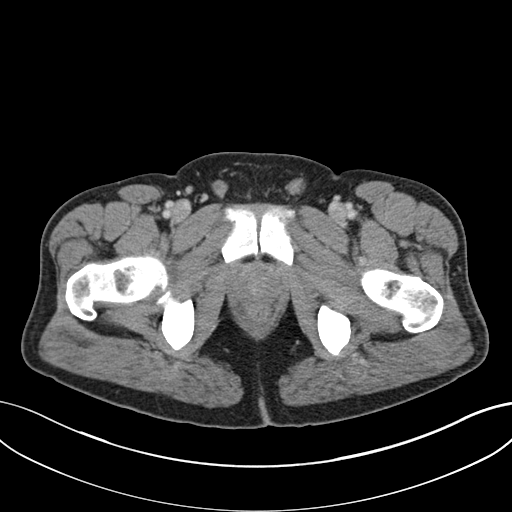
[im 25/84  soft-tissue]
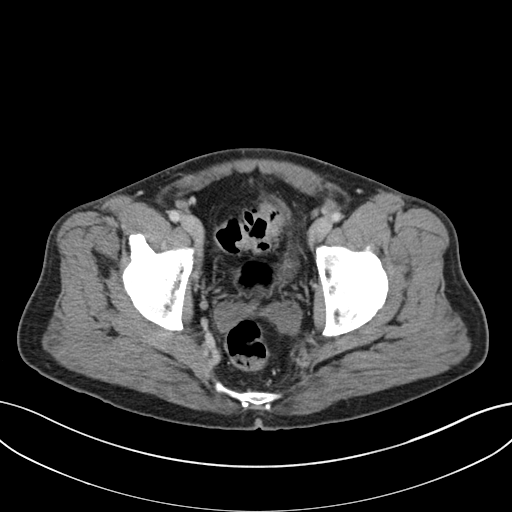
[im 30/84  soft-tissue]
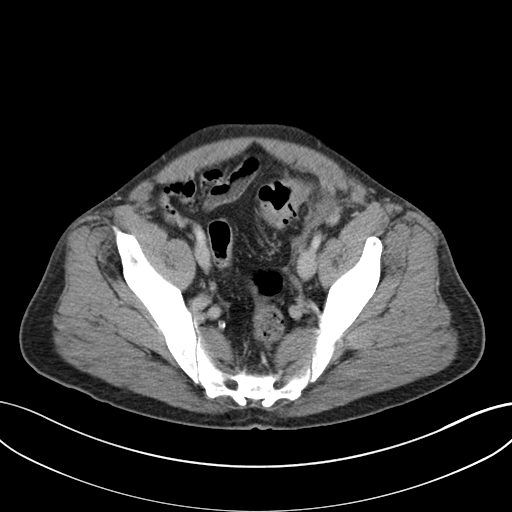
[im 35/84  soft-tissue]
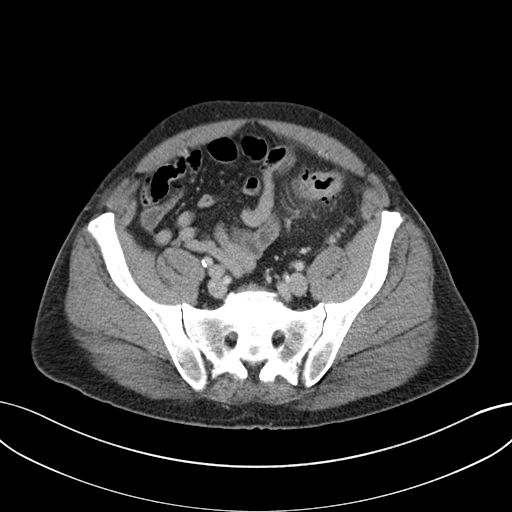
[im 40/84  soft-tissue]
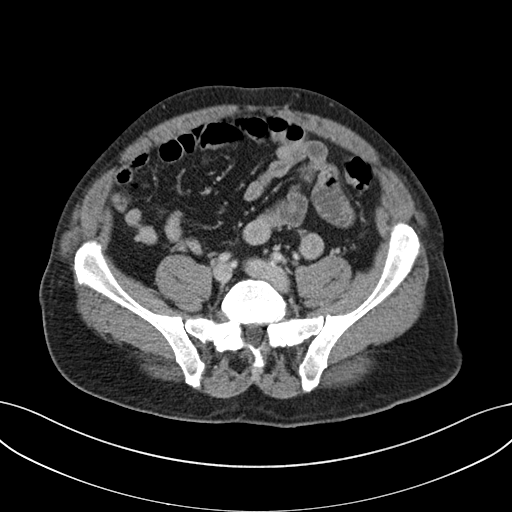
[im 44/84  soft-tissue]
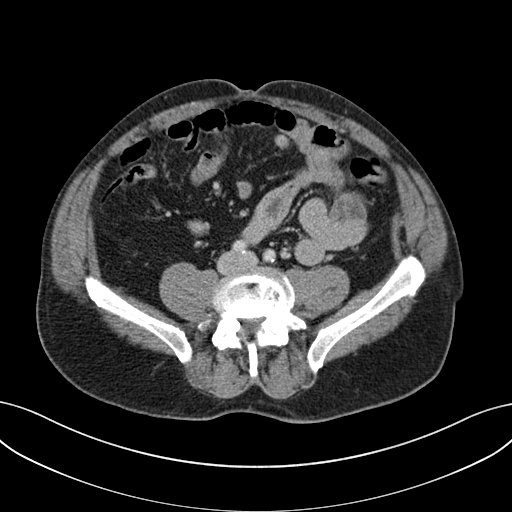
[im 49/84  soft-tissue]
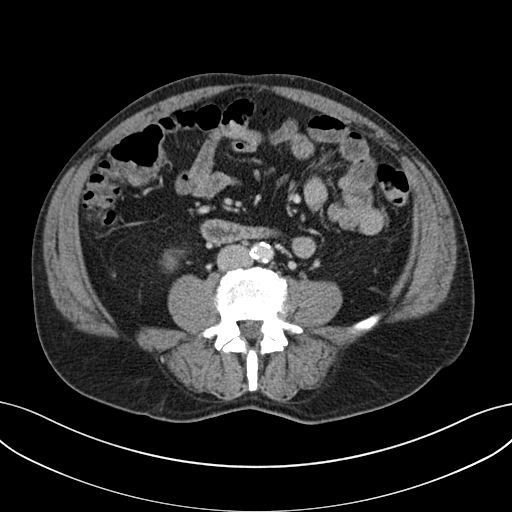
[im 49/84  bone]
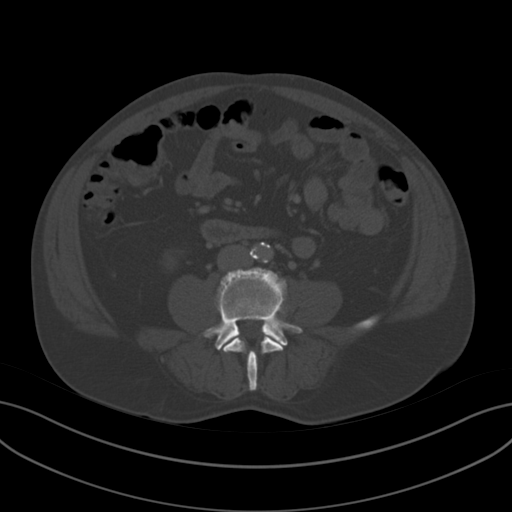
[im 54/84  soft-tissue]
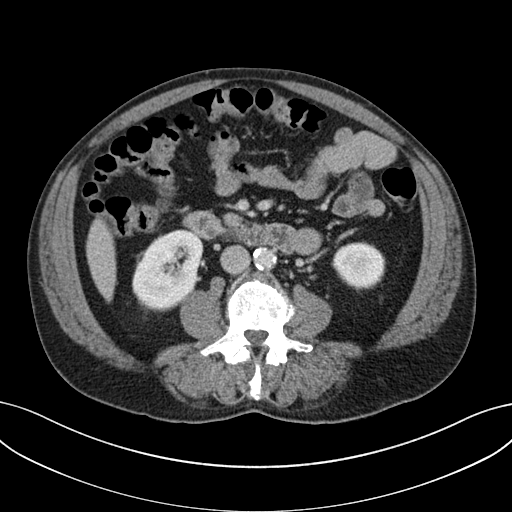
[im 64/84  soft-tissue]
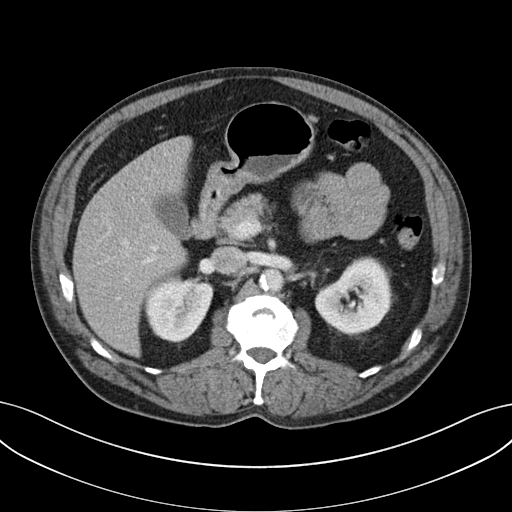
[im 69/84  soft-tissue]
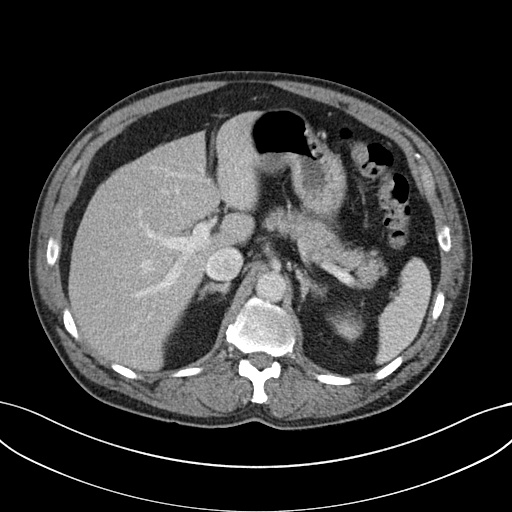
[im 74/84  soft-tissue]
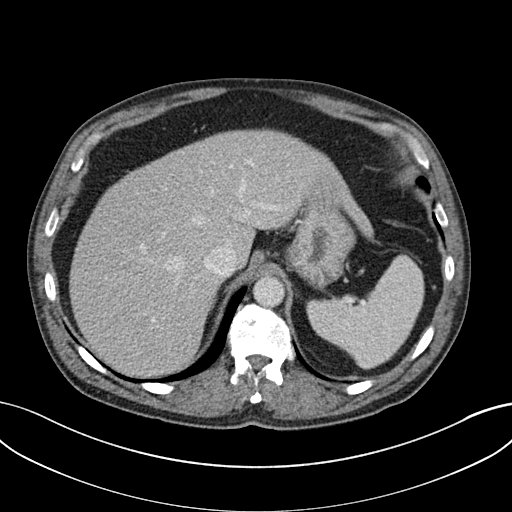
[im 79/84  soft-tissue]
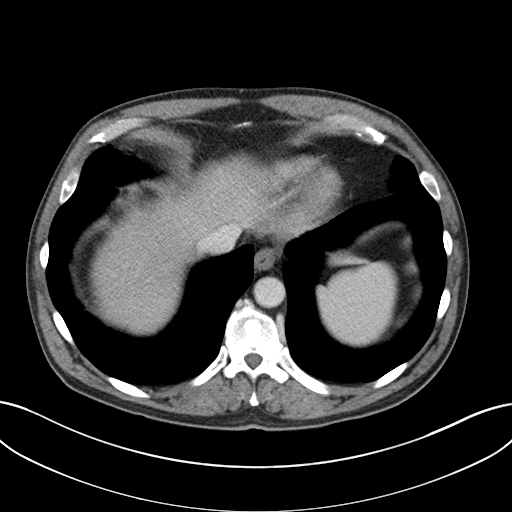

[Series 6: coronal st · coronal · 0.70mm/px · 3 of 99 slices shown]
[im 33/99  soft-tissue]
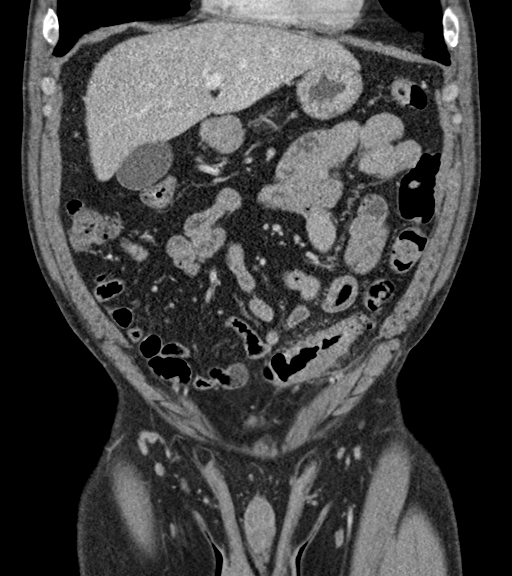
[im 44/99  soft-tissue]
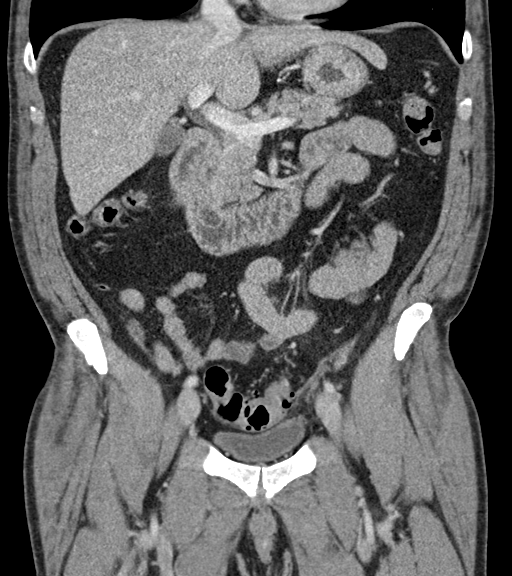
[im 55/99  soft-tissue]
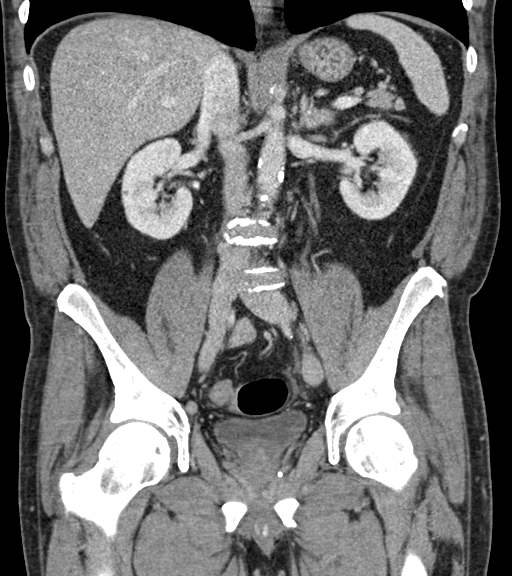

[17 of 46 positions shown; findings below may reference images not displayed]

FINDINGS: Lower chest: Lung bases are clear. No effusions. Heart is normal
size.

Hepatobiliary: Mild fatty infiltration of the liver. Gallbladder
unremarkable.

Pancreas: No focal abnormality or ductal dilatation.

Spleen: No focal abnormality.  Normal size.

Adrenals/Urinary Tract: No adrenal abnormality. No focal renal
abnormality. No stones or hydronephrosis. Urinary bladder is
unremarkable.

Stomach/Bowel: Sigmoid diverticulosis. Inflammatory stranding around
the sigmoid colon compatible with active diverticulitis. Stomach and
small bowel decompressed. Appendix normal.

Vascular/Lymphatic: Aortic atherosclerosis. No enlarged abdominal or
pelvic lymph nodes.

Reproductive: Mild prominence of the prostate.

Other: No free fluid or free air.

Musculoskeletal: No acute bony abnormality.
IMPRESSION: Sigmoid diverticulosis. Inflammatory stranding adjacent to the
sigmoid colon compatible with active diverticulitis. No evidence of
perforation.

Early fatty infiltration of the liver.

## 2019-09-09 ENCOUNTER — Encounter: Payer: Self-pay | Admitting: Physician Assistant

## 2019-09-09 MED ORDER — CLONAZEPAM 0.5 MG PO TABS: 0.2500 mg | ORAL_TABLET | Freq: Every day | ORAL | 0 refills | Status: DC

## 2019-09-09 NOTE — Progress Notes (Signed)
IMPRESSION:  1. 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  2. Severe Periodic Limb Movement Disorder (PLMD), which continued in milder form during REM sleep.  3. Moderate Snoring. 4. Hypoxia in sleep with a nadir of 79% and total desaturation time of 25 minutes.    RECOMMENDATIONS:  1. Advise full-night, attended, CPAP titration study to optimize therapy.   2. PLMs in REM suspicious for REM BD. We should treat with more than melatonin now.  3. Substance reduction. Any apnea patient can benefit from less sedative, tranquilizer or alcohol intake.

## 2019-09-09 NOTE — Procedures (Signed)
PATIENT'S NAME:  Matthew [name] DOB:      [birth year]      MR#:    [id number]     DATE OF RECORDING: 08/28/2019 Matthew [name] REFERRING M.D.:  Lorrene Reid, PA-C Study Performed:   Baseline Polysomnogram HISTORY:  [name] is seen as a new patient to my practice, and just had a new primary care PA assigned and a concern of elevated blood pressures in the morning, sometimes associated with tinnitus or dizziness or headaches, has been raised. He drinks alcohol as a relaxant.  Since these are symptoms that seem to be worse in the morning a relation to sleep apnea is possible.  The patient also feels that he has not the same energy level and he feels sleepy in daytime. He endorsed an excessive level of daytime sleepiness and fatigue. Has had such high BP that he went to the ED.  The patient endorsed the Epworth Sleepiness Scale at 17 points, FSS at 40/63 points.    The patient's weight 179 pounds with a height of 63 (inches), resulting in a BMI of 31.6 kg/m2. The patient's neck circumference measured 16.5 inches.  CURRENT MEDICATIONS: Hygroton, Pepcid complete, Zestril, Melatonin, Toprol-XL, Vit D   PROCEDURE:  This is a multichannel digital polysomnogram utilizing the Somnostar 11.2 system.  Electrodes and sensors were applied and monitored per AASM Specifications.   EEG, EOG, Chin and Limb EMG, were sampled at 200 Hz.  ECG, Snore and Nasal Pressure, Thermal Airflow, Respiratory Effort, CPAP Flow and Pressure, Oximetry was sampled at 50 Hz. Digital video and audio were recorded.      BASELINE STUDY: Lights Out was at 22:46 and Lights On at 04:48.  Total recording time (TRT) was 363 minutes, with a total sleep time (TST) of 309 minutes.   The patient's sleep latency was 28 minutes.  REM latency was 114.5 minutes.  The sleep efficiency was 85.1 %.     SLEEP ARCHITECTURE: WASO (Wake after sleep onset) was 39 minutes.  There were 44.5 minutes in Stage N1, 155.5 minutes Stage N2, 34 minutes Stage N3 and 75  minutes in Stage REM.  The percentage of Stage N1 was 14.4%, Stage N2 was 50.3%, Stage N3 was 11.% and Stage R (REM sleep) was 24.3%.    RESPIRATORY ANALYSIS:  There were a total of 118 respiratory events:  118 hypopneas with a hypopnea index of 22.9 /hour. The patient also had 0 respiratory event related arousals (RERAs).     The total APNEA/HYPOPNEA INDEX (AHI) was 22.9/hour.  63 events occurred in REM sleep and 110 events in NREM. The REM AHI was  50.4 /hour, versus a non-REM AHI of 14.1. The patient spent 100 minutes of total sleep time in the supine position and 209 minutes in non-supine.. The supine AHI was 32.4 versus a non-supine AHI of 18.4.  OXYGEN SATURATION & C02:  The Wake baseline 02 saturation was 92%, with the lowest being 79%. Time spent below 89% saturation equaled 25 minutes. The arousals were noted as: 74 were spontaneous, 28 were associated with PLMs, 74 were associated with respiratory events. The patient had a total of 277 Periodic Limb Movements.  The Periodic Limb Movement (PLM) Arousal index was 5.4/hour.  Audio and video analysis did not show any abnormal or unusual movements, behaviors, phonations or vocalizations.  One time nocturia.  Moderate Snoring was noted. EKG was in keeping with normal sinus rhythm (NSR).   IMPRESSION:  1. 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  2. Severe Periodic Limb Movement Disorder (PLMD), which continued in milder form during REM sleep.  3. Moderate Snoring. 4. Hypoxia in sleep with a nadir of 79% and total desaturation time of 25 minutes.    RECOMMENDATIONS:  1. Advise full-night, attended, CPAP titration study to optimize therapy.   2. PLMs in REM suspicious for REM BD. We should treat with more than melatonin now.  3. Substance reduction. Any apnea patient can benefit from less sedative, tranquilizer or alcohol intake.    I certify that I have reviewed the entire raw  data recording prior to the issuance of this report in accordance with the Standards of Accreditation of the American Academy of Sleep Medicine (AASM)    Larey Seat, MD Diplomat, American Board of Psychiatry and Neurology  Diplomat, American Board of Sleep Medicine Market researcher, [facility] [employer]

## 2019-09-09 NOTE — Addendum Note (Signed)
Addended by: Larey Seat on: 09/09/2019 06:07 PM   Modules accepted: Orders

## 2019-09-09 NOTE — Addendum Note (Signed)
Addended by: Larey Seat on: 09/09/2019 06:09 PM   Modules accepted: Orders

## 2019-09-10 NOTE — Telephone Encounter (Signed)
Updated patient's medication list. Took metoprolol 25 mg off patient's list since patient is on Metoprolol 50 mg daily now. Maritza Abonza PA increased patient's lisinopril to 20 mg on 07/29/19, added new dose of lisinopril instead of 10 mg.

## 2019-09-13 ENCOUNTER — Telehealth: Payer: Self-pay | Admitting: Neurology

## 2019-09-13 ENCOUNTER — Ambulatory Visit (INDEPENDENT_AMBULATORY_CARE_PROVIDER_SITE_OTHER): Payer: BC Managed Care – PPO | Admitting: Physician Assistant

## 2019-09-13 ENCOUNTER — Encounter: Payer: Self-pay | Admitting: Neurology

## 2019-09-13 ENCOUNTER — Encounter: Payer: Self-pay | Admitting: Physician Assistant

## 2019-09-13 ENCOUNTER — Other Ambulatory Visit: Payer: Self-pay

## 2019-09-13 VITALS — BP 122/70 | HR 76 | Temp 97.4°F | Ht 63.0 in | Wt 175.9 lb

## 2019-09-13 DIAGNOSIS — G4733 Obstructive sleep apnea (adult) (pediatric): Secondary | ICD-10-CM

## 2019-09-13 DIAGNOSIS — R7303 Prediabetes: Secondary | ICD-10-CM | POA: Diagnosis not present

## 2019-09-13 DIAGNOSIS — I10 Essential (primary) hypertension: Secondary | ICD-10-CM

## 2019-09-13 DIAGNOSIS — E785 Hyperlipidemia, unspecified: Secondary | ICD-10-CM | POA: Insufficient documentation

## 2019-09-13 DIAGNOSIS — N529 Male erectile dysfunction, unspecified: Secondary | ICD-10-CM

## 2019-09-13 NOTE — Patient Instructions (Signed)
Erectile Dysfunction Erectile dysfunction (ED) is the inability to get or keep an erection in order to have sexual intercourse. Erectile dysfunction may include:  Inability to get an erection.  Lack of enough hardness of the erection to allow penetration.  Loss of the erection before sex is finished. What are the causes? This condition may be caused by:  Certain medicines, such as: ? Pain relievers. ? Antihistamines. ? Antidepressants. ? Blood pressure medicines. ? Water pills (diuretics). ? Ulcer medicines. ? Muscle relaxants. ? Drugs.  Excessive drinking.  Psychological causes, such as: ? Anxiety. ? Depression. ? Sadness. ? Exhaustion. ? Performance fear. ? Stress.  Physical causes, such as: ? Artery problems. This may include diabetes, smoking, liver disease, or atherosclerosis. ? High blood pressure. ? Hormonal problems, such as low testosterone. ? Obesity. ? Nerve problems. This may include back or pelvic injuries, diabetes mellitus, multiple sclerosis, or Parkinson disease. What are the signs or symptoms? Symptoms of this condition include:  Inability to get an erection.  Lack of enough hardness of the erection to allow penetration.  Loss of the erection before sex is finished.  Normal erections at some times, but with frequent unsatisfactory episodes.  Low sexual satisfaction in either partner due to erection problems.  A curved penis occurring with erection. The curve may cause pain or the penis may be too curved to allow for intercourse.  Never having nighttime erections. How is this diagnosed? This condition is often diagnosed by:  Performing a physical exam to find other diseases or specific problems with the penis.  Asking you detailed questions about the problem.  Performing blood tests to check for diabetes mellitus or to measure hormone levels.  Performing other tests to check for underlying health conditions.  Performing an ultrasound  exam to check for scarring.  Performing a test to check blood flow to the penis.  Doing a sleep study at home to measure nighttime erections. How is this treated? This condition may be treated by:  Medicine taken by mouth to help you achieve an erection (oral medicine).  Hormone replacement therapy to replace low testosterone levels.  Medicine that is injected into the penis. Your health care provider may instruct you how to give yourself these injections at home.  Vacuum pump. This is a pump with a ring on it. The pump and ring are placed on the penis and used to create pressure that helps the penis become erect.  Penile implant surgery. In this procedure, you may receive: ? An inflatable implant. This consists of cylinders, a pump, and a reservoir. The cylinders can be inflated with a fluid that helps to create an erection, and they can be deflated after intercourse. ? A semi-rigid implant. This consists of two silicone rubber rods. The rods provide some rigidity. They are also flexible, so the penis can both curve downward in its normal position and become straight for sexual intercourse.  Blood vessel surgery, to improve blood flow to the penis. During this procedure, a blood vessel from a different part of the body is placed into the penis to allow blood to flow around (bypass) damaged or blocked blood vessels.  Lifestyle changes, such as exercising more, losing weight, and quitting smoking. Follow these instructions at home: Medicines   Take over-the-counter and prescription medicines only as told by your health care provider. Do not increase the dosage without first discussing it with your health care provider.  If you are using self-injections, perform injections as directed by your  health care provider. Make sure to avoid any veins that are on the surface of the penis. After giving an injection, apply pressure to the injection site for 5 minutes. General  instructions  Exercise regularly, as directed by your health care provider. Work with your health care provider to lose weight, if needed.  Do not use any products that contain nicotine or tobacco, such as cigarettes and e-cigarettes. If you need help quitting, ask your health care provider.  Before using a vacuum pump, read the instructions that come with the pump and discuss any questions with your health care provider.  Keep all follow-up visits as told by your health care provider. This is important. Contact a health care provider if:  You feel nauseous.  You vomit. Get help right away if:  You are taking oral or injectable medicines and you have an erection that lasts longer than 4 hours. If your health care provider is unavailable, go to the nearest emergency room for evaluation. An erection that lasts much longer than 4 hours can result in permanent damage to your penis.  You have severe pain in your groin or abdomen.  You develop redness or severe swelling of your penis.  You have redness spreading up into your groin or lower abdomen.  You are unable to urinate.  You experience chest pain or a rapid heart beat (palpitations) after taking oral medicines. Summary  Erectile dysfunction (ED) is the inability to get or keep an erection during sexual intercourse. This problem can usually be treated successfully.  This condition is diagnosed based on a physical exam, your symptoms, and tests to determine the cause. Treatment varies depending on the cause, and may include medicines, hormone therapy, surgery, or vacuum pump.  You may need follow-up visits to make sure that you are using your medicines or devices correctly.  Get help right away if you are taking or injecting medicines and you have an erection that lasts longer than 4 hours. This information is not intended to replace advice given to you by your health care provider. Make sure you discuss any questions you have with  your health care provider. Document Revised: 02/24/2017 Document Reviewed: 03/30/2016 Elsevier Patient Education  [place].

## 2019-09-13 NOTE — Telephone Encounter (Signed)
Called patient to discuss sleep study results. No answer at this time. LVM for the patient to call back.  Will also send a mychart message to the patient and advised that in VM.

## 2019-09-13 NOTE — Progress Notes (Signed)
Established Patient Office Visit  Subjective:  Patient ID: Matthew [name], male    DOB: 30-Sep-1960  Age: 59 y.o. MRN: [id number]  CC:  Chief Complaint  Patient presents with   Hypertension    HPI Matthew [name] presents for chronic follow-up on hypertension and for second shingles vaccine. Pt was seen in 06/2019 for uncontrolled hypertension and eventually referred to cardiology for BP management. He was also referred to Neurology for sleep study due to daytime fatigue and uncontrolled hypertension for possible OSA.   HTN: Pt denies chest pain, palpitations, dizziness or lower extremity swelling. Taking medication as directed without side effects. States he stopped chlorthalidone due to myalgias. Checks BP at home and readings range in <130/80. Pt tries to follow a low salt diet.  OSA: Reports he is waiting to get his supplies. Continues to feel some fatigue and is trying to reduce his alcohol use.  HLD: Pt taking medication as directed without issues. Denies side effects including myalgias and RUQ pain. He has made some dietary changes by reducing red meat and limiting fried foods. Reports he stays active with his job.  ED: Reports having trouble with ED and is inquiring about treatment options.    Past Medical History:  Diagnosis Date   Acute bronchitis 03/30/2017   Acute maxillary sinusitis 07/25/2017   Acute otitis media, right 05/05/2016   Acute respiratory failure with hypoxia ([place]) 03/30/2017   CAP (community acquired pneumonia) 03/31/2017   COPD (chronic obstructive pulmonary disease) ([place])    Diarrhea 10/16/2017   Diverticulitis    Ear build-up, left 08/10/2017   Hypertension    Left otitis media 07/17/2017   Pharyngitis 05/05/2016   Sigmoid diverticulitis 02/11/2018    Past Surgical History:  Procedure Laterality Date   HERNIA REPAIR     HERNIA REPAIR  1966    Family History  Problem Relation Age of Onset   Lymphoma Father    Cancer Mother         breast   Colon cancer Neg Hx    Rectal cancer Neg Hx    Stomach cancer Neg Hx     Social History   Socioeconomic History   Marital status: Widowed    Spouse name: Not on file   Number of children: 1   Years of education: Not on file   Highest education level: Not on file  Occupational History   Not on file  Tobacco Use   Smoking status: Former Smoker    Packs/day: 1.00    Types: Cigarettes    Quit date: 03/30/2017    Years since quitting: 2.4   Smokeless tobacco: Never Used  Vaping Use   Vaping Use: Never used  Substance and Sexual Activity   Alcohol use: Not on file    Comment: 7 cans of beer a week.   Drug use: Not Currently    Frequency: 2.0 times per week    Types: Marijuana   Sexual activity: Yes    Birth control/protection: None  Other Topics Concern   Not on file  Social History Narrative   Not on file   Social Determinants of Health   Financial Resource Strain:    Difficulty of Paying Living Expenses:   Food Insecurity:    Worried About Charity fundraiser in the Last Year:    Arboriculturist in the Last Year:   Transportation Needs:    Film/video editor (Medical):    Lack of Transportation (Non-Medical):  Physical Activity:    Days of Exercise per Week:    Minutes of Exercise per Session:   Stress:    Feeling of Stress :   Social Connections:    Frequency of Communication with Friends and Family:    Frequency of Social Gatherings with Friends and Family:    Attends Religious Services:    Active Member of Clubs or Organizations:    Attends Music therapist:    Marital Status:   Intimate Partner Violence:    Fear of Current or Ex-Partner:    Emotionally Abused:    Physically Abused:    Sexually Abused:     Outpatient Medications Prior to Visit  Medication Sig Dispense Refill   clonazePAM (KLONOPIN) 0.5 MG tablet Take 0.5 tablets (0.25 mg total) by mouth at bedtime. 30 tablet 0    famotidine-calcium carbonate-magnesium hydroxide (PEPCID COMPLETE) 10-800-165 MG chewable tablet Chew 1 tablet by mouth daily as needed.     lisinopril (ZESTRIL) 20 MG tablet Take 20 mg by mouth daily.     metoprolol tartrate (LOPRESSOR) 50 MG tablet Take 1 tablet (50 mg total) by mouth daily. 90 tablet 3   Vitamin D, Ergocalciferol, (DRISDOL) 1.25 MG (50000 UNIT) CAPS capsule TAKE 1 CAPSULE BY MOUTH EVERY 7 DAYS 4 capsule 0   atorvastatin (LIPITOR) 40 MG tablet Take 1 tablet (40 mg total) by mouth daily. 30 tablet 6   chlorthalidone (HYGROTON) 25 MG tablet Take 1 tablet (25 mg total) by mouth daily. (Patient taking differently: Take 50 mg by mouth daily. ) 30 tablet 1   MELATONIN PO Take by mouth as needed (sleep).     No facility-administered medications prior to visit.    No Known Allergies  ROS Review of Systems  A fourteen system review of systems was performed and found to be positive as per HPI.    Objective:    Physical Exam General:  Well Developed, well nourished, appropriate for stated age.  Neuro:  Alert and oriented,  extra-ocular muscles intact  HEENT:  Normocephalic, atraumatic, neck supple  Skin:  no gross rash, warm, pink. Cardiac:  RRR, S1 S2 Respiratory:  ECTA B/L and A/P, Not using accessory muscles, speaking in full sentences- unlabored. Vascular:  Ext warm, no cyanosis apprec.;  No gross edema Psych:  No HI/SI, judgement and insight good, Euthymic mood. Full Affect.  BP 122/70    Pulse 76    Temp (!) 97.4 F (36.3 C) (Oral)    Ht 5\' 3"  (1.6 m)    Wt 175 lb 14.4 oz (79.8 kg)    SpO2 98% Comment: on RA   BMI 31.16 kg/m  Wt Readings from Last 3 Encounters:  09/13/19 175 lb 14.4 oz (79.8 kg)  08/19/19 178 lb (80.7 kg)  08/08/19 178 lb (80.7 kg)     There are no preventive care reminders to display for this patient.  There are no preventive care reminders to display for this patient.  Lab Results  Component Value Date   TSH 1.360 07/16/2019    Lab Results  Component Value Date   WBC 6.6 07/16/2019   HGB 17.4 07/16/2019   HCT 51.7 (H) 07/16/2019   MCV 97 07/16/2019   PLT 254 07/16/2019   Lab Results  Component Value Date   NA 138 07/16/2019   K 4.8 07/16/2019   CO2 22 07/16/2019   GLUCOSE 108 (H) 07/16/2019   BUN 12 07/16/2019   CREATININE 0.94 07/16/2019   BILITOT 0.4 07/16/2019  ALKPHOS 96 07/16/2019   AST 42 (H) 07/16/2019   ALT 39 09/20/2019   PROT 7.3 07/16/2019   ALBUMIN 4.5 07/16/2019   CALCIUM 9.9 07/16/2019   ANIONGAP 7 02/13/2018   GFR 97.35 12/13/2017   Lab Results  Component Value Date   CHOL 136 09/20/2019   Lab Results  Component Value Date   HDL 40 09/20/2019   Lab Results  Component Value Date   LDLCALC 76 09/20/2019   Lab Results  Component Value Date   TRIG 111 09/20/2019   Lab Results  Component Value Date   CHOLHDL 3.4 09/20/2019   Lab Results  Component Value Date   HGBA1C 6.4 (H) 09/13/2019      Assessment & Plan:   Problem List Items Addressed This Visit      Cardiovascular and Mediastinum   Hypertension - Primary    - BP today is 122/70, at goal. - Most recent CMP: Electrolytes WNL, kidney function normal - Continue Metoprolol and Lisinopril. - Continue ambulatory BP and pulse monitoring few times/wk. - Follow DASH diet. - Encourage to stay as active as possible.         Respiratory   OSA (obstructive sleep apnea)     Other   Hyperlipidemia   Prediabetes   Relevant Orders   HgB A1c (Completed)    Other Visit Diagnoses    Erectile dysfunction, unspecified erectile dysfunction type       Relevant Orders   Testosterone,Free and Total (Completed)   HgB A1c (Completed)     HLD: - Last lipid panel: Total cholesterol and LDL elevated - Patient has follow-up with cardiology next week including lipid panel and hepatic function recheck. - Continue atorvastatin 40 mg. - Follow heart healthy diet.  OSA: - Followed by Neurology. -Nocturnal  Polysomnography: IMPRESSION:  1. Obstructive Sleep hypopnea, moderate at AHI 22.9/h (OSA),  strong REM dependence to AHI of 50.4/h.  2. Severe Periodic Limb Movement Disorder (PLMD), which continued  in milder form during REM sleep.  3. Moderate Snoring.  4. Hypoxia in sleep with a nadir of 79% and total desaturation  time of 25 minutes.  -Recommended to continue to reduce alcohol consumption.  Prediabetes: -Last A1c 5.8, stable -Follow low carbohydrate and glucose diet. -Placed lab order to recheck A1c.   Erectile dysfunction: -Patient's SHIM score of 12 indicating mild to moderate ED. -Placed lab order for testosterone to evaluate for possible etiology. -Patient has multiple risk factors for ED such as hypertension, hyperlipidemia, OSA, and prediabetes that are likely contributing to symptoms, and would consider optimizing management of comorbidities before starting medication therapy. -Recommend to increase physical activity and reduce alcohol consumption.   No orders of the defined types were placed in this encounter.   Follow-up: Return in about 3 months (around 12/14/2019) for HTN, OSA, Pre-DM.    Lorrene Reid, PA-C

## 2019-09-13 NOTE — Assessment & Plan Note (Addendum)
-   BP today is 122/70, at goal. - Most recent CMP: Electrolytes WNL, kidney function normal - Continue Metoprolol and Lisinopril. - Continue ambulatory BP and pulse monitoring few times/wk. - Follow DASH diet. - Encourage to stay as active as possible.

## 2019-09-13 NOTE — Telephone Encounter (Signed)
-----   Message from Larey Seat, MD sent at 09/09/2019  6:07 PM EDT ----- IMPRESSION:  1. 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  2. Severe Periodic Limb Movement Disorder (PLMD), which continued in milder form during REM sleep.  3. Moderate Snoring. 4. Hypoxia in sleep with a nadir of 79% and total desaturation time of 25 minutes.    RECOMMENDATIONS:  1. Advise full-night, attended, CPAP titration study to optimize therapy.   2. PLMs in REM suspicious for REM BD. We should treat with more than melatonin now.  3. Substance reduction. Any apnea patient can benefit from less sedative, tranquilizer or alcohol intake.

## 2019-09-16 NOTE — Telephone Encounter (Signed)
Pt called and left a VM asking for Myriam Jacobson, RN to call him back regarding sleep study results.

## 2019-09-16 NOTE — Telephone Encounter (Signed)
Called the patient back and he was just asking when he would be scheduled for the CPAP titration study. Advised that our sleep lab processes the study through insurance and then they will be in contact once they get insurance approval. He verbalized understanding.   He also wanted to mention that he had already been taking the clonopin and he stopped that to try the melatonin and has not filled like that was as successful as the clonazepam was. Informed him I would note that.

## 2019-09-17 ENCOUNTER — Encounter: Payer: Self-pay | Admitting: Physician Assistant

## 2019-09-18 LAB — TESTOSTERONE,FREE AND TOTAL
Testosterone, Free: 18.8 pg/mL (ref 7.2–24.0)
Testosterone: 494 ng/dL (ref 264–916)

## 2019-09-18 LAB — HEMOGLOBIN A1C
Est. average glucose Bld gHb Est-mCnc: 137 mg/dL
Hgb A1c MFr Bld: 6.4 % — ABNORMAL HIGH (ref 4.8–5.6)

## 2019-09-20 ENCOUNTER — Other Ambulatory Visit: Payer: Self-pay

## 2019-09-20 ENCOUNTER — Other Ambulatory Visit: Payer: BC Managed Care – PPO | Admitting: *Deleted

## 2019-09-20 DIAGNOSIS — E785 Hyperlipidemia, unspecified: Secondary | ICD-10-CM | POA: Diagnosis not present

## 2019-09-20 LAB — LIPID PANEL
Chol/HDL Ratio: 3.4 ratio (ref 0.0–5.0)
Cholesterol, Total: 136 mg/dL (ref 100–199)
HDL: 40 mg/dL (ref >39)
LDL Chol Calc (NIH): 76 mg/dL (ref 0–99)
Triglycerides: 111 mg/dL (ref 0–149)
VLDL Cholesterol Cal: 20 mg/dL (ref 5–40)

## 2019-09-20 LAB — ALT: ALT: 39 IU/L (ref 0–44)

## 2019-09-22 ENCOUNTER — Ambulatory Visit (INDEPENDENT_AMBULATORY_CARE_PROVIDER_SITE_OTHER): Payer: BC Managed Care – PPO | Admitting: Neurology

## 2019-09-22 DIAGNOSIS — G4733 Obstructive sleep apnea (adult) (pediatric): Secondary | ICD-10-CM | POA: Diagnosis not present

## 2019-09-22 DIAGNOSIS — I158 Other secondary hypertension: Secondary | ICD-10-CM

## 2019-09-22 DIAGNOSIS — G4719 Other hypersomnia: Secondary | ICD-10-CM

## 2019-09-22 DIAGNOSIS — I10 Essential (primary) hypertension: Secondary | ICD-10-CM

## 2019-09-22 DIAGNOSIS — R0683 Snoring: Secondary | ICD-10-CM

## 2019-09-22 DIAGNOSIS — J189 Pneumonia, unspecified organism: Secondary | ICD-10-CM

## 2019-09-22 DIAGNOSIS — J209 Acute bronchitis, unspecified: Secondary | ICD-10-CM

## 2019-09-23 ENCOUNTER — Telehealth: Payer: Self-pay

## 2019-09-23 DIAGNOSIS — I700 Atherosclerosis of aorta: Secondary | ICD-10-CM

## 2019-09-23 MED ORDER — ATORVASTATIN CALCIUM 80 MG PO TABS: 80.0000 mg | ORAL_TABLET | Freq: Every day | ORAL | 3 refills | Status: DC

## 2019-09-23 NOTE — Telephone Encounter (Signed)
-----   Message from Sueanne Margarita, MD sent at 09/20/2019 10:18 PM EDT ----- LDL not at goal of < 70 - given coronary calcium increase atorvastatin to 80mg  daily and repeat FLP and ALT in 6 weeks

## 2019-09-23 NOTE — Telephone Encounter (Signed)
The patient has been notified of the result and verbalized understanding.  All questions (if any) were answered. Antonieta Iba, RN 09/23/2019 1:26 PM

## 2019-09-28 DIAGNOSIS — I10 Essential (primary) hypertension: Secondary | ICD-10-CM | POA: Insufficient documentation

## 2019-09-28 NOTE — Procedures (Signed)
[facility] PATIENT'S NAME:  Matthew [name], Matthew [name] DOB:      [birth year]      MR#:    [id number]     DATE OF RECORDING: 09/22/2019 [facility] REFERRING M.D.:  Lorrene Reid, PA-C Study Performed:   Titration to CPAP 1. This patient with nocturnal peaks in blood pressure was referred to rule out OSA.  2. His PSG sleep study on 08-28-2019 revealed  :Obstructive Sleep hypopnea, moderate at AHI 22.9/h (OSA), strong REM dependence to AHI of 50.4/h. Severe Periodic Limb Movement Disorder (PLMD), which continued in milder form during REM sleep. Hypoxia in sleep with a nadir of 79% and total desaturation time of 25 minutes.    The patient endorsed the Epworth Sleepiness Scale at 17/24 points.   The patient's weight 178 pounds with a height of 63 (inches), resulting in a BMI of 31.6 kg/m2. The patient's neck circumference measured 16.5 inches.  CURRENT MEDICATIONS: [place], Pepcid complete, Zestril, Melatonin, Toprol-XL, Vit D    PROCEDURE:  This is a multichannel digital polysomnogram utilizing the SomnoStar 11.2 system.  Electrodes and sensors were applied and monitored per AASM Specifications.   EEG, EOG, Chin and Limb EMG, were sampled at 200 Hz.  ECG, Snore and Nasal Pressure, Thermal Airflow, Respiratory Effort, CPAP Flow and Pressure, Oximetry was sampled at 50 Hz. Digital video and audio were recorded.      FP VITERA FFM in large size was applied by RPSGT.  CPAP was initiated at 5 cmH20 with heated humidity per AASM split night standards and pressure was advanced to 9 cmH20 because of hypopneas, apneas and desaturations.  At a final PAP pressure of 9 cmH20, the patient slept for 107 minutes and reached a reduction of the AHI to 1.1/h with improvement of sleep apnea. The titration was incomplete, but documented REM sleep under CPAP as well as improved hypoxia. SpO2 nadir at 9cm water pressure was 86%.   Lights Out was at 22:13 and Lights On at 05:00. Total recording time (TRT) was 407.5 minutes, with a  total sleep time (TST) of 369.5 minutes. The patient's sleep latency was 8 minutes. REM latency was 77.5 minutes.  The sleep efficiency was 90.7 %.    SLEEP ARCHITECTURE: WASO (Wake after sleep onset) was 29.5 minutes.  There were 6 minutes in Stage N1, 237 minutes Stage N2, 56 minutes Stage N3 and 70.5 minutes in Stage REM.  The percentage of Stage N1 was 1.6%, Stage N2 was 64.1%, Stage N3 was 15.2% and Stage R (REM sleep) was 19.1%.  The arousals were noted as: 48 were spontaneous, 17 were associated with PLMs, 0 were associated with respiratory events.     RESPIRATORY ANALYSIS:  There was a total of 13 respiratory events: There were 13 hypopneas with a hypopnea index of 2.1/hour.  The total APNEA/HYPOPNEA INDEX (AHI) was 2.1 /hour. 11 events occurred in REM sleep and 2 events in NREM. The REM AHI was 9.4 /hour versus a non-REM AHI of 0.4 /hour.   The patient spent 246.5 minutes of total sleep time in the supine position and 123 minutes in non-supine. The supine AHI was 0.5, versus a non-supine AHI of 5.4.  OXYGEN SATURATION & C02:  The baseline 02 saturation was 92%, with the lowest being 84%. Time spent below 89% saturation equaled 30 minutes.  PERIODIC LIMB MOVEMENTS:  The patient had a total of 198 Periodic Limb Movements. The Periodic Limb Movement (PLM) Arousal index was 0.8 /hour. This was mild PLM disorder, not  persistent in REM sleep.  Audio and video analysis did not show any abnormal or unusual movements, behaviors, phonations or vocalizations.  The patient took a bathroom break. EKG was in keeping with normal sinus rhythm.  DIAGNOSIS: Partial correction of Sleep apnea under the highest explored pressure of 9 cm water CPAP. No central sleep apnea events were noted, Hypoxia was still noted for 30 minutes of the total titration study sleep time.    PLANS/RECOMMENDATIONS: The patient was fitted with a F&P Vitera Large full face mask. An autotitration capable CPAP device will be  ordered, a setting form 6 through 12 cm water, 2 cm EPR and patient's choice of interface, with heated humidity.   A follow up appointment will be scheduled in the Sleep Clinic at [facility].   Please call [phone] with any questions.      I certify that I have reviewed the entire raw data recording prior to the issuance of this report in accordance with the Standards of Accreditation of the American Academy of Sleep Medicine (AASM)  Larey Seat, M.D. Diplomat, Tax adviser of Psychiatry and Neurology  Diplomat, Tax adviser of Sleep Medicine Market researcher, [employer] [facility] at [employer]

## 2019-09-28 NOTE — Progress Notes (Signed)
DIAGNOSIS: Partial correction of Sleep apnea under the highest explored pressure of 9 cm water CPAP. No central sleep apnea events were noted, Hypoxia was still noted for 30 minutes of the total titration study sleep time.    PLANS/RECOMMENDATIONS: The patient was fitted with a F&P Vitera Large full face mask. An autotitration capable CPAP device will be ordered, a setting from 6 through 12 cm water, 2 cm EPR and patient's choice of interface, with heated humidity.

## 2019-09-28 NOTE — Addendum Note (Signed)
Addended by: Larey Seat on: 09/28/2019 04:00 PM   Modules accepted: Orders

## 2019-10-02 ENCOUNTER — Telehealth: Payer: Self-pay | Admitting: Neurology

## 2019-10-02 ENCOUNTER — Encounter: Payer: Self-pay | Admitting: Neurology

## 2019-10-02 NOTE — Telephone Encounter (Signed)
Called patient to discuss sleep study results. No answer at this time. LVM for the patient to call back.  Will sent a mychart message.

## 2019-10-02 NOTE — Telephone Encounter (Signed)
-----   Message from Larey Seat, MD sent at 09/28/2019  4:00 PM EDT ----- DIAGNOSIS: Partial correction of Sleep apnea under the highest explored pressure of 9 cm water CPAP. No central sleep apnea events were noted, Hypoxia was still noted for 30 minutes of the total titration study sleep time.    PLANS/RECOMMENDATIONS: The patient was fitted with a F&P Vitera Large full face mask. An autotitration capable CPAP device will be ordered, a setting from 6 through 12 cm water, 2 cm EPR and patient's choice of interface, with heated humidity.

## 2019-10-03 NOTE — Telephone Encounter (Signed)
Pt returned call. I advised pt that Dr. Brett Fairy reviewed their sleep study results and found that pt was best treated with a CPAP at a pressure of 9 cm. Dr Dohmeier recommends that pt starts auto CPAP. I reviewed PAP compliance expectations with the pt. Pt is agreeable to starting a CPAP. I advised pt that an order will be sent to a DME, [facility] and [facility] will call the pt within about one week after they file with the pt's insurance. [facility]) will show the pt how to use the machine, fit for masks, and troubleshoot the CPAP if needed. A follow up appt was made for insurance purposes with Ward Givens, NP on Sept 16,2021 at 10 am. Pt verbalized understanding to arrive 15 minutes early and bring their CPAP. A letter with all of this information in it will be mailed to the pt as a reminder. I verified with the pt that the address we have on file is correct. Pt verbalized understanding of results. Pt had no questions at this time but was encouraged to call back if questions arise. I have sent the order to [facility]) and have received confirmation that they have received the order.

## 2019-10-14 ENCOUNTER — Other Ambulatory Visit: Payer: Self-pay | Admitting: Neurology

## 2019-10-14 MED ORDER — CLONAZEPAM 0.5 MG PO TABS: 0.2500 mg | ORAL_TABLET | Freq: Every day | ORAL | 0 refills | Status: DC

## 2019-10-30 DIAGNOSIS — G4733 Obstructive sleep apnea (adult) (pediatric): Secondary | ICD-10-CM | POA: Diagnosis not present

## 2019-11-04 ENCOUNTER — Other Ambulatory Visit: Payer: BC Managed Care – PPO | Admitting: *Deleted

## 2019-11-04 ENCOUNTER — Other Ambulatory Visit: Payer: Self-pay

## 2019-11-04 DIAGNOSIS — I700 Atherosclerosis of aorta: Secondary | ICD-10-CM | POA: Diagnosis not present

## 2019-11-04 LAB — LIPID PANEL
Chol/HDL Ratio: 2.8 ratio (ref 0.0–5.0)
Cholesterol, Total: 133 mg/dL (ref 100–199)
HDL: 48 mg/dL (ref >39)
LDL Chol Calc (NIH): 64 mg/dL (ref 0–99)
Triglycerides: 119 mg/dL (ref 0–149)
VLDL Cholesterol Cal: 21 mg/dL (ref 5–40)

## 2019-11-04 LAB — ALT: ALT: 40 IU/L (ref 0–44)

## 2019-11-07 ENCOUNTER — Ambulatory Visit (INDEPENDENT_AMBULATORY_CARE_PROVIDER_SITE_OTHER): Payer: BC Managed Care – PPO | Admitting: Cardiology

## 2019-11-07 ENCOUNTER — Encounter: Payer: Self-pay | Admitting: Cardiology

## 2019-11-07 ENCOUNTER — Other Ambulatory Visit: Payer: Self-pay

## 2019-11-07 VITALS — BP 120/60 | HR 61 | Ht 63.0 in | Wt 172.6 lb

## 2019-11-07 DIAGNOSIS — I10 Essential (primary) hypertension: Secondary | ICD-10-CM

## 2019-11-07 DIAGNOSIS — R931 Abnormal findings on diagnostic imaging of heart and coronary circulation: Secondary | ICD-10-CM | POA: Diagnosis not present

## 2019-11-07 DIAGNOSIS — E7800 Pure hypercholesterolemia, unspecified: Secondary | ICD-10-CM

## 2019-11-07 NOTE — Patient Instructions (Signed)

## 2019-11-07 NOTE — Progress Notes (Addendum)
Cardiology Office Note    Date:  11/07/2019   ID:  Matthew [name], DOB 05/06/1960, MRN [id number]  PCP:  Lorrene Reid, PA-C  Cardiologist:  Fransico Him, MD   Chief Complaint  Patient presents with  . Hypertension    History of Present Illness:  This is a 59yo male with a hx of COPD and HTN which has been difficult to control who was referred by his PCP for evaluation of HTN.  When I last saw him we stopped Ziac and started Toprol and continued his Lisinopril and Chlorthalidone.  He had to stop the Chlorthalidone due to severe leg cramps.  He is here today for followup and is doing well.  He denies any chest pain or pressure, SOB, DOE, PND, orthopnea, LE edema, dizziness, palpitations or syncope. He is compliant with his meds and is tolerating meds with no SE.    Past Medical History:  Diagnosis Date  . Acute bronchitis 03/30/2017  . Acute maxillary sinusitis 07/25/2017  . Acute otitis media, right 05/05/2016  . Acute respiratory failure with hypoxia ([place]) 03/30/2017  . CAP (community acquired pneumonia) 03/31/2017  . COPD (chronic obstructive pulmonary disease) ([place])   . Diarrhea 10/16/2017  . Diverticulitis   . Ear build-up, left 08/10/2017  . Hypertension   . Left otitis media 07/17/2017  . Pharyngitis 05/05/2016  . Sigmoid diverticulitis 02/11/2018    Past Surgical History:  Procedure Laterality Date  . HERNIA REPAIR    . HERNIA REPAIR  1966    Current Medications: Current Meds  Medication Sig  . atorvastatin (LIPITOR) 80 MG tablet Take 1 tablet (80 mg total) by mouth daily.  . clonazePAM (KLONOPIN) 0.5 MG tablet Take 0.5 tablets (0.25 mg total) by mouth at bedtime.  . famotidine-calcium carbonate-magnesium hydroxide (PEPCID COMPLETE) 10-800-165 MG chewable tablet Chew 1 tablet by mouth daily as needed.  Marland Kitchen lisinopril (ZESTRIL) 20 MG tablet Take 20 mg by mouth daily.  . metoprolol tartrate (LOPRESSOR) 50 MG tablet Take 1 tablet (50 mg total) by mouth daily.  . Vitamin D,  Ergocalciferol, (DRISDOL) 1.25 MG (50000 UNIT) CAPS capsule TAKE 1 CAPSULE BY MOUTH EVERY 7 DAYS    Allergies:   Patient has no known allergies.   Social History   Socioeconomic History  . Marital status: Widowed    Spouse name: Not on file  . Number of children: 1  . Years of education: Not on file  . Highest education level: Not on file  Occupational History  . Not on file  Tobacco Use  . Smoking status: Former Smoker    Packs/day: 1.00    Types: Cigarettes    Quit date: 03/30/2017    Years since quitting: 2.6  . Smokeless tobacco: Never Used  Vaping Use  . Vaping Use: Never used  Substance and Sexual Activity  . Alcohol use: Not on file    Comment: 7 cans of beer a week.  . Drug use: Not Currently    Frequency: 2.0 times per week    Types: Marijuana  . Sexual activity: Yes    Birth control/protection: None  Other Topics Concern  . Not on file  Social History Narrative  . Not on file   Social Determinants of Health   Financial Resource Strain:   . Difficulty of Paying Living Expenses:   Food Insecurity:   . Worried About Charity fundraiser in the Last Year:   . [place] in the Last Year:  Transportation Needs:   . Film/video editor (Medical):   Marland Kitchen Lack of Transportation (Non-Medical):   Physical Activity:   . Days of Exercise per Week:   . Minutes of Exercise per Session:   Stress:   . Feeling of Stress :   Social Connections:   . Frequency of Communication with Friends and Family:   . Frequency of Social Gatherings with Friends and Family:   . Attends Religious Services:   . Active Member of Clubs or Organizations:   . Attends Archivist Meetings:   Marland Kitchen Marital Status:      Family History:  The patient's family history includes Cancer in his mother; Lymphoma in his father.   ROS:   Please see the history of present illness.    ROS All other systems reviewed and are negative.  No flowsheet data found.   PHYSICAL EXAM:   VS:   BP 120/60   Pulse 61   Ht 5\' 3"  (1.6 m)   Wt 172 lb 9.6 oz (78.3 kg)   SpO2 90%   BMI 30.57 kg/m    GEN: Well nourished, well developed, in no acute distress  HEENT: normal  Neck: no JVD, carotid bruits, or masses Cardiac: RRR; no murmurs, rubs, or gallops,no edema.  Intact distal pulses bilaterally.  Respiratory:  clear to auscultation bilaterally, normal work of breathing GI: soft, nontender, nondistended, + BS MS: no deformity or atrophy  Skin: warm and dry, no rash Neuro:  Alert and Oriented x 3, Strength and sensation are intact Psych: euthymic mood, full affect  Wt Readings from Last 3 Encounters:  11/07/19 172 lb 9.6 oz (78.3 kg)  09/13/19 175 lb 14.4 oz (79.8 kg)  08/19/19 178 lb (80.7 kg)      Studies/Labs Reviewed:   EKG:  EKG is not ordered today.    Recent Labs: 07/16/2019: BUN 12; Creatinine, Ser 0.94; Hemoglobin 17.4; Platelets 254; Potassium 4.8; Sodium 138; TSH 1.360 11/04/2019: ALT 40   Lipid Panel    Component Value Date/Time   CHOL 133 11/04/2019 0857   TRIG 119 11/04/2019 0857   HDL 48 11/04/2019 0857   CHOLHDL 2.8 11/04/2019 0857   LDLCALC 64 11/04/2019 0857    Additional studies/ records that were reviewed today include:  Office notes from PCP  ASSESSMENT:    1. Essential hypertension   2. Pure hypercholesterolemia   3. Agatston coronary artery calcium score between 200 and 399      PLAN:  In order of problems listed above:  1.  HTN -BP is well controlled on exam today -he is concerned because his BP cuff is running high at home -his BP was well controlled at his last OV with PCP in June -I am going to have him bring in his BP cuff to check against ours -continue  Lisinopril  20mg  daily, Toprol XL 25mg  daily   2.  Dyslipidemia -he was told this was high at one point but now diet controlled although his last HLD was low at 19 -given his CRFs including remote tobacco use, dyslipidemia with low HDL and HTN, a coronary calcium score was  done showing a score of 335 -LDL goal < 70 -LDL was 64 and HDL 48 on lipids a few days ago -continue Lipitor 80mg  daily  3.  Coronary artery calcification -calcium score was 331 -continue statin -he denies any chest pain    Medication Adjustments/Labs and Tests Ordered: Current medicines are reviewed at length with the patient today.  Concerns regarding medicines are outlined above.  Medication changes, Labs and Tests ordered today are listed in the Patient Instructions below.  There are no Patient Instructions on file for this visit.   Signed, Fransico Him, MD  11/07/2019 9:15 AM    [address], [place], [place]  [address] Phone: [phone]; Fax: [phone]

## 2019-11-25 ENCOUNTER — Telehealth: Payer: Self-pay | Admitting: Cardiology

## 2019-11-25 MED ORDER — LISINOPRIL 20 MG PO TABS: 20.0000 mg | ORAL_TABLET | Freq: Every day | ORAL | 3 refills | Status: DC

## 2019-11-25 NOTE — Telephone Encounter (Signed)
Pt c/o medication issue:  1. Name of Medication: lisinopril (ZESTRIL) 20 MG tablet  2. How are you currently taking this medication (dosage and times per day)? As directed  3. Are you having a reaction (difficulty breathing--STAT)? No  4. What is your medication issue? Patient would like Dr. Radford Pax to handle this medication instead of his PCP. If allowed, he needs a prescription sent to [place], [address].

## 2019-11-25 NOTE — Telephone Encounter (Signed)
Sent prescription to patient's pharmacy of choice. Sent Mychart message letting patient know it has been sent.

## 2019-11-30 DIAGNOSIS — G4733 Obstructive sleep apnea (adult) (pediatric): Secondary | ICD-10-CM | POA: Diagnosis not present

## 2019-12-11 ENCOUNTER — Telehealth: Payer: Self-pay | Admitting: Adult Health

## 2019-12-11 NOTE — Telephone Encounter (Signed)
Called patient back but he did not answer. Left message for him to call back. Will keep him on the schedule until I speak with him.

## 2019-12-11 NOTE — Telephone Encounter (Signed)
Patient is scheduled for an initial CPAP follow up tomorrow at [place] with [facility]. Upon review of his CPAP download, he has only used his CPAP machine 7 out of 30 days. This visit would not count towards his compliance visit.   I left a message for the patient to call back to see if he wanted to reschedule or keep the appt to discuss any issues with his machine. Will keep this encounter open for follow up.

## 2019-12-11 NOTE — Telephone Encounter (Signed)
Pt called back needing to speak to RN. Pt states that he has been having trouble with his machine and the DME was supposed to call him and they never did. Please advise.

## 2019-12-11 NOTE — Telephone Encounter (Signed)
Spoke with patient. He stated that he has been struggling with his machine. He was under the impression that [facility] would follow up with him 1-2 weeks after receiving his machine. They have not contacted him. As a result, he has not used the machine. I advised him that I would reach out to [facility] to see if they can contact him, cancel the appt for tomorrow and for me to follow up with him next Wednesday. He verbalized understanding.   Will send a staff message to [place]. Reminder has been placed. Appt for tomorrow has been cancelled.   Nothing further needed at time of call.

## 2019-12-12 ENCOUNTER — Other Ambulatory Visit: Payer: Self-pay | Admitting: *Deleted

## 2019-12-12 ENCOUNTER — Ambulatory Visit: Payer: Self-pay | Admitting: Adult Health

## 2019-12-12 MED ORDER — CLONAZEPAM 0.5 MG PO TABS: 0.2500 mg | ORAL_TABLET | Freq: Every day | ORAL | 0 refills | Status: DC

## 2019-12-12 NOTE — Telephone Encounter (Signed)
-----   Message ----- From: [name] Sent: 12/11/2019  4:31 PM EDT To: Michell Heinrich Pool Subject: Appointment Canceled               I have 3 days of my medication left. Can a refill be called in to my pharmacy?  Thank you

## 2019-12-16 ENCOUNTER — Other Ambulatory Visit: Payer: Self-pay

## 2019-12-16 ENCOUNTER — Other Ambulatory Visit: Payer: Self-pay | Admitting: Neurology

## 2019-12-17 MED ORDER — METOPROLOL TARTRATE 50 MG PO TABS
50.0000 mg | ORAL_TABLET | Freq: Every day | ORAL | 3 refills | Status: DC
Start: 2019-12-17 — End: 2020-12-15

## 2019-12-30 DIAGNOSIS — G4733 Obstructive sleep apnea (adult) (pediatric): Secondary | ICD-10-CM | POA: Diagnosis not present

## 2020-01-30 DIAGNOSIS — G4733 Obstructive sleep apnea (adult) (pediatric): Secondary | ICD-10-CM | POA: Diagnosis not present

## 2020-02-05 DIAGNOSIS — Z20822 Contact with and (suspected) exposure to covid-19: Secondary | ICD-10-CM | POA: Diagnosis not present

## 2020-02-05 DIAGNOSIS — Z03818 Encounter for observation for suspected exposure to other biological agents ruled out: Secondary | ICD-10-CM | POA: Diagnosis not present

## 2020-02-16 ENCOUNTER — Encounter: Payer: Self-pay | Admitting: Neurology

## 2020-02-17 ENCOUNTER — Other Ambulatory Visit: Payer: Self-pay | Admitting: Neurology

## 2020-02-17 DIAGNOSIS — I158 Other secondary hypertension: Secondary | ICD-10-CM

## 2020-02-17 DIAGNOSIS — G4719 Other hypersomnia: Secondary | ICD-10-CM

## 2020-02-17 DIAGNOSIS — J9601 Acute respiratory failure with hypoxia: Secondary | ICD-10-CM

## 2020-02-17 DIAGNOSIS — R5383 Other fatigue: Secondary | ICD-10-CM

## 2020-02-17 DIAGNOSIS — R0683 Snoring: Secondary | ICD-10-CM

## 2020-02-17 MED ORDER — CLONAZEPAM 0.5 MG PO TABS: 0.2500 mg | ORAL_TABLET | Freq: Every day | ORAL | 1 refills | Status: DC

## 2020-03-23 DIAGNOSIS — R0902 Hypoxemia: Secondary | ICD-10-CM | POA: Diagnosis not present

## 2020-03-23 DIAGNOSIS — J3489 Other specified disorders of nose and nasal sinuses: Secondary | ICD-10-CM | POA: Diagnosis not present

## 2020-04-23 ENCOUNTER — Encounter: Payer: Self-pay | Admitting: Physician Assistant

## 2020-05-19 ENCOUNTER — Ambulatory Visit: Payer: BC Managed Care – PPO | Admitting: Physician Assistant

## 2020-06-03 DIAGNOSIS — G5603 Carpal tunnel syndrome, bilateral upper limbs: Secondary | ICD-10-CM | POA: Diagnosis not present

## 2020-06-03 DIAGNOSIS — I10 Essential (primary) hypertension: Secondary | ICD-10-CM | POA: Diagnosis not present

## 2020-06-03 DIAGNOSIS — E782 Mixed hyperlipidemia: Secondary | ICD-10-CM | POA: Diagnosis not present

## 2020-06-03 DIAGNOSIS — Z125 Encounter for screening for malignant neoplasm of prostate: Secondary | ICD-10-CM | POA: Diagnosis not present

## 2020-06-10 ENCOUNTER — Ambulatory Visit: Payer: BC Managed Care – PPO | Admitting: Physician Assistant

## 2020-06-17 DIAGNOSIS — Z23 Encounter for immunization: Secondary | ICD-10-CM | POA: Diagnosis not present

## 2020-08-13 ENCOUNTER — Other Ambulatory Visit: Payer: Self-pay | Admitting: Neurology

## 2020-08-13 DIAGNOSIS — R0683 Snoring: Secondary | ICD-10-CM

## 2020-08-13 DIAGNOSIS — G4719 Other hypersomnia: Secondary | ICD-10-CM

## 2020-08-13 DIAGNOSIS — R5383 Other fatigue: Secondary | ICD-10-CM

## 2020-08-13 DIAGNOSIS — I158 Other secondary hypertension: Secondary | ICD-10-CM

## 2020-08-13 DIAGNOSIS — J9601 Acute respiratory failure with hypoxia: Secondary | ICD-10-CM

## 2020-08-17 ENCOUNTER — Encounter: Payer: Self-pay | Admitting: Neurology

## 2020-09-03 ENCOUNTER — Telehealth: Payer: Self-pay | Admitting: Neurology

## 2020-09-03 DIAGNOSIS — R0683 Snoring: Secondary | ICD-10-CM

## 2020-09-03 DIAGNOSIS — I158 Other secondary hypertension: Secondary | ICD-10-CM

## 2020-09-03 DIAGNOSIS — R5383 Other fatigue: Secondary | ICD-10-CM

## 2020-09-03 DIAGNOSIS — G4719 Other hypersomnia: Secondary | ICD-10-CM

## 2020-09-03 DIAGNOSIS — J9601 Acute respiratory failure with hypoxia: Secondary | ICD-10-CM

## 2020-09-03 NOTE — Telephone Encounter (Signed)
I scheduled a follow-up with patient for next available appointment which is 11/04/20, he is asking if he can get a refill on his Klonopin before then.

## 2020-09-04 MED ORDER — CLONAZEPAM 0.5 MG PO TABS: 0.2500 mg | ORAL_TABLET | Freq: Every day | ORAL | 1 refills | Status: DC

## 2020-09-04 NOTE — Telephone Encounter (Signed)
I refilled his script for klonopin.

## 2020-09-04 NOTE — Addendum Note (Signed)
Addended by: Larey Seat on: 09/04/2020 11:32 AM   Modules accepted: Orders

## 2020-10-22 ENCOUNTER — Other Ambulatory Visit: Payer: Self-pay | Admitting: Cardiology

## 2020-11-04 ENCOUNTER — Ambulatory Visit: Payer: BC Managed Care – PPO | Admitting: Neurology

## 2020-12-15 ENCOUNTER — Other Ambulatory Visit: Payer: Self-pay

## 2020-12-15 ENCOUNTER — Encounter: Payer: Self-pay | Admitting: Cardiology

## 2020-12-15 ENCOUNTER — Ambulatory Visit (INDEPENDENT_AMBULATORY_CARE_PROVIDER_SITE_OTHER): Payer: BC Managed Care – PPO | Admitting: Cardiology

## 2020-12-15 VITALS — BP 136/84 | HR 78 | Ht 66.0 in | Wt 178.0 lb

## 2020-12-15 DIAGNOSIS — R931 Abnormal findings on diagnostic imaging of heart and coronary circulation: Secondary | ICD-10-CM | POA: Diagnosis not present

## 2020-12-15 DIAGNOSIS — G4733 Obstructive sleep apnea (adult) (pediatric): Secondary | ICD-10-CM | POA: Diagnosis not present

## 2020-12-15 DIAGNOSIS — E7800 Pure hypercholesterolemia, unspecified: Secondary | ICD-10-CM

## 2020-12-15 DIAGNOSIS — I10 Essential (primary) hypertension: Secondary | ICD-10-CM

## 2020-12-15 LAB — COMPREHENSIVE METABOLIC PANEL
ALT: 37 IU/L (ref 0–44)
AST: 29 IU/L (ref 0–40)
Albumin/Globulin Ratio: 2.1 (ref 1.2–2.2)
Albumin: 4.6 g/dL (ref 3.8–4.9)
Alkaline Phosphatase: 126 IU/L — ABNORMAL HIGH (ref 44–121)
BUN/Creatinine Ratio: 11 (ref 10–24)
BUN: 12 mg/dL (ref 8–27)
Bilirubin Total: 0.5 mg/dL (ref 0.0–1.2)
CO2: 27 mmol/L (ref 20–29)
Calcium: 9.9 mg/dL (ref 8.6–10.2)
Chloride: 100 mmol/L (ref 96–106)
Creatinine, Ser: 1.1 mg/dL (ref 0.76–1.27)
Globulin, Total: 2.2 g/dL (ref 1.5–4.5)
Glucose: 111 mg/dL — ABNORMAL HIGH (ref 65–99)
Potassium: 5.5 mmol/L — ABNORMAL HIGH (ref 3.5–5.2)
Sodium: 140 mmol/L (ref 134–144)
Total Protein: 6.8 g/dL (ref 6.0–8.5)
eGFR: 77 mL/min/{1.73_m2} (ref >59)

## 2020-12-15 LAB — LIPID PANEL
Chol/HDL Ratio: 3.2 ratio (ref 0.0–5.0)
Cholesterol, Total: 148 mg/dL (ref 100–199)
HDL: 46 mg/dL (ref >39)
LDL Chol Calc (NIH): 74 mg/dL (ref 0–99)
Triglycerides: 163 mg/dL — ABNORMAL HIGH (ref 0–149)
VLDL Cholesterol Cal: 28 mg/dL (ref 5–40)

## 2020-12-15 MED ORDER — METOPROLOL TARTRATE 50 MG PO TABS: 50.0000 mg | ORAL_TABLET | Freq: Every day | ORAL | 3 refills | Status: DC

## 2020-12-15 NOTE — Addendum Note (Signed)
Addended by: Antonieta Iba on: 12/15/2020 09:28 AM   Modules accepted: Orders

## 2020-12-15 NOTE — Progress Notes (Signed)
Cardiology Office Note    Date:  12/15/2020   ID:  Matthew [name], DOB 1960-05-19, MRN [id number]  PCP:  [name], MD  Cardiologist:  Fransico Him, MD   Chief Complaint  Patient presents with   Hypertension   Hyperlipidemia     History of Present Illness:  This is a 60yo male with a hx of COPD and HTN.  He had to stop the Chlorthalidone due to severe leg cramps.  He has a hx of OSA but did not tolerate CPAP.  He is interested in the [place] device.  He sleeps poorly at night, has non restorative sleep and excessive daytime sleepiness.   He is here today for followup and is doing well.  He denies any chest pain or pressure, SOB, DOE, PND, orthopnea, LE edema, dizziness, palpitations or syncope. He is compliant with his meds and is tolerating meds with no SE.     Past Medical History:  Diagnosis Date   Acute bronchitis 03/30/2017   Acute maxillary sinusitis 07/25/2017   Acute otitis media, right 05/05/2016   Acute respiratory failure with hypoxia ([place]) 03/30/2017   CAP (community acquired pneumonia) 03/31/2017   COPD (chronic obstructive pulmonary disease) ([place])    Diarrhea 10/16/2017   Diverticulitis    Ear build-up, left 08/10/2017   Hypertension    Left otitis media 07/17/2017   Pharyngitis 05/05/2016   Sigmoid diverticulitis 02/11/2018    Past Surgical History:  Procedure Laterality Date   HERNIA REPAIR     HERNIA REPAIR  1966    Current Medications: Current Meds  Medication Sig   atorvastatin (LIPITOR) 80 MG tablet TAKE 1 TABLET BY MOUTH DAILY   clonazePAM (KLONOPIN) 0.5 MG tablet Take 0.5 tablets (0.25 mg total) by mouth at bedtime.   famotidine-calcium carbonate-magnesium hydroxide (PEPCID COMPLETE) 10-800-165 MG chewable tablet Chew 1 tablet by mouth daily as needed.   lisinopril (ZESTRIL) 20 MG tablet Take 1 tablet (20 mg total) by mouth daily.   metoprolol tartrate (LOPRESSOR) 50 MG tablet Take 1 tablet (50 mg total) by mouth daily.    Allergies:    Patient has no known allergies.   Social History   Socioeconomic History   Marital status: Widowed    Spouse name: Not on file   Number of children: 1   Years of education: Not on file   Highest education level: Not on file  Occupational History   Not on file  Tobacco Use   Smoking status: Former    Packs/day: 1.00    Types: Cigarettes    Quit date: 03/30/2017    Years since quitting: 3.7   Smokeless tobacco: Never  Vaping Use   Vaping Use: Never used  Substance and Sexual Activity   Alcohol use: Not on file    Comment: 7 cans of beer a week.   Drug use: Not Currently    Frequency: 2.0 times per week    Types: Marijuana   Sexual activity: Yes    Birth control/protection: None  Other Topics Concern   Not on file  Social History Narrative   Not on file   Social Determinants of Health   Financial Resource Strain: Not on file  Food Insecurity: Not on file  Transportation Needs: Not on file  Physical Activity: Not on file  Stress: Not on file  Social Connections: Not on file     Family History:  The patient's family history includes Cancer in his mother; Lymphoma in his father.  ROS:   Please see the history of present illness.    ROS All other systems reviewed and are negative.  No flowsheet data found.   PHYSICAL EXAM:   VS:  BP 136/84   Pulse 78   Ht 5\' 6"  (1.676 m)   Wt 178 lb (80.7 kg)   SpO2 94%   BMI 28.73 kg/m     GEN: Well nourished, well developed in no acute distress HEENT: Normal NECK: No JVD; No carotid bruits LYMPHATICS: No lymphadenopathy CARDIAC:RRR, no murmurs, rubs, gallops RESPIRATORY:  Clear to auscultation without rales, wheezing or rhonchi  ABDOMEN: Soft, non-tender, non-distended MUSCULOSKELETAL:  No edema; No deformity  SKIN: Warm and dry NEUROLOGIC:  Alert and oriented x 3 PSYCHIATRIC:  Normal affect    Wt Readings from Last 3 Encounters:  12/15/20 178 lb (80.7 kg)  11/07/19 172 lb 9.6 oz (78.3 kg)  09/13/19 175 lb 14.4  oz (79.8 kg)      Studies/Labs Reviewed:   EKG:  EKG is ordered today and demonstrated NSR with wandering baseline artifact  Recent Labs: No results found for requested labs within last 8760 hours.   Lipid Panel    Component Value Date/Time   CHOL 133 11/04/2019 0857   TRIG 119 11/04/2019 0857   HDL 48 11/04/2019 0857   CHOLHDL 2.8 11/04/2019 0857   LDLCALC 64 11/04/2019 0857    Additional studies/ records that were reviewed today include:  Office notes from PCP  ASSESSMENT:    1. Primary hypertension   2. Pure hypercholesterolemia   3. Agatston coronary artery calcium score between 200 and 399   4. OSA (obstructive sleep apnea)      PLAN:  In order of problems listed above:  1.  HTN -BP is adequately controlled on exam today -Continue prescription drug management with Lisinopril 20mg  daily and Toprol XL 50mg  daily>refilled -check BMET  2.  Dyslipidemia -given his CRFs including remote tobacco use, dyslipidemia with low HDL and HTN, a coronary calcium score was done showing a score of 335, LDL goal < 70 -check FLP and ALT -Continue prescription drug management with Atorvastatin 80mg  daily>refilled   3.  Coronary artery calcification -calcium score was 331 -he has not had any chest pain -continue statin   4.  OSA -he has been intolerant to CPAP -he has poor sleep at night, nonrestorative sleep and excessive daytime sleepiness -I will repeat e HST -refer to ENT for evaluation for Inspire device   Medication Adjustments/Labs and Tests Ordered: Current medicines are reviewed at length with the patient today.  Concerns regarding medicines are outlined above.  Medication changes, Labs and Tests ordered today are listed in the Patient Instructions below.  There are no Patient Instructions on file for this visit.   Signed, Fransico Him, MD  12/15/2020 9:20 AM    [facility] [address], [place], [place]  [address] Phone: [phone]; Fax: [phone]

## 2020-12-15 NOTE — Patient Instructions (Signed)
Medication Instructions:  Your physician recommends that you continue on your current medications as directed. Please refer to the Current Medication list given to you today.  *If you need a refill on your cardiac medications before your next appointment, please call your pharmacy*   Lab Work: TODAY: CMET and Fasting lipids   If you have labs (blood work) drawn today and your tests are completely normal, you will receive your results only by: [place] (if you have MyChart) OR A paper copy in the mail If you have any lab test that is abnormal or we need to change your treatment, we will call you to review the results.   Testing/Procedures: Your physician has recommended that you have a sleep study. This test records several body functions during sleep, including: brain activity, eye movement, oxygen and carbon dioxide blood levels, heart rate and rhythm, breathing rate and rhythm, the flow of air through your mouth and nose, snoring, body muscle movements, and chest and belly movement.  Follow-Up: At [facility], you and your health needs are our priority.  As part of our continuing mission to provide you with exceptional heart care, we have created designated Provider Care Teams.  These Care Teams include your primary Cardiologist (physician) and Advanced Practice Providers (APPs -  Physician Assistants and Nurse Practitioners) who all work together to provide you with the care you need, when you need it.  Your next appointment:   1 year(s)  The format for your next appointment:   In Person  Provider:   You may see Fransico Him, MD or one of the following Advanced Practice Providers on your designated Care Team:   Melina Copa, PA-C Ermalinda Barrios, PA-C   Other Instructions You have been referred to an Ear, Nose and Throat Specialist for a consultation for the [facility] Device.

## 2020-12-16 ENCOUNTER — Telehealth: Payer: Self-pay

## 2020-12-16 DIAGNOSIS — E7800 Pure hypercholesterolemia, unspecified: Secondary | ICD-10-CM

## 2020-12-16 DIAGNOSIS — I10 Essential (primary) hypertension: Secondary | ICD-10-CM

## 2020-12-16 MED ORDER — ROSUVASTATIN CALCIUM 40 MG PO TABS: 40.0000 mg | ORAL_TABLET | Freq: Every day | ORAL | 3 refills | Status: DC

## 2020-12-16 MED ORDER — AMLODIPINE BESYLATE 5 MG PO TABS: 5.0000 mg | ORAL_TABLET | Freq: Every day | ORAL | 3 refills | Status: DC

## 2020-12-16 NOTE — Telephone Encounter (Signed)
-----   Message from Sueanne Margarita, MD sent at 12/16/2020  3:49 PM EDT ----- Stop Lisinopril due to elevated K+ and start Amlodipine 5mg  daily - check BP daily for a week and call with results. Repeat BMET on Monday.  Avoid high potassium foods.  Due to elevated TAGS, stop atorvastatin and change to Crestor 40mg  daily and repeat FLp and ALT in 8 weeks

## 2020-12-16 NOTE — Telephone Encounter (Signed)
The patient has been notified of the result and verbalized understanding.  All questions (if any) were answered. Antonieta Iba, RN 12/16/2020 4:43 PM  Patient will stop lisinopril and start amlodipine.  Patient will stop atorvastatin and start crestor. BMET scheduled for 9/26. Lipids scheduled for 11/21.  Patient aware of high potassium foods to avoid.  He will call in one week with a list of his blood pressure readings.

## 2020-12-21 ENCOUNTER — Encounter: Payer: Self-pay | Admitting: Cardiology

## 2020-12-21 ENCOUNTER — Other Ambulatory Visit: Payer: Self-pay

## 2020-12-21 ENCOUNTER — Other Ambulatory Visit: Payer: BC Managed Care – PPO | Admitting: *Deleted

## 2020-12-21 DIAGNOSIS — I10 Essential (primary) hypertension: Secondary | ICD-10-CM

## 2020-12-21 DIAGNOSIS — E7800 Pure hypercholesterolemia, unspecified: Secondary | ICD-10-CM | POA: Diagnosis not present

## 2020-12-21 NOTE — Telephone Encounter (Signed)
Not needed

## 2020-12-22 LAB — BASIC METABOLIC PANEL
BUN/Creatinine Ratio: 14 (ref 10–24)
BUN: 15 mg/dL (ref 8–27)
CO2: 23 mmol/L (ref 20–29)
Calcium: 9.5 mg/dL (ref 8.6–10.2)
Chloride: 102 mmol/L (ref 96–106)
Creatinine, Ser: 1.08 mg/dL (ref 0.76–1.27)
Glucose: 69 mg/dL — ABNORMAL LOW (ref 70–99)
Potassium: 4.3 mmol/L (ref 3.5–5.2)
Sodium: 141 mmol/L (ref 134–144)
eGFR: 79 mL/min/{1.73_m2} (ref >59)

## 2020-12-22 LAB — LIPID PANEL
Chol/HDL Ratio: 3.2 ratio (ref 0.0–5.0)
Cholesterol, Total: 141 mg/dL (ref 100–199)
HDL: 44 mg/dL (ref >39)
LDL Chol Calc (NIH): 66 mg/dL (ref 0–99)
Triglycerides: 184 mg/dL — ABNORMAL HIGH (ref 0–149)
VLDL Cholesterol Cal: 31 mg/dL (ref 5–40)

## 2020-12-22 LAB — ALT: ALT: 40 IU/L (ref 0–44)

## 2020-12-23 ENCOUNTER — Telehealth: Payer: Self-pay

## 2020-12-23 DIAGNOSIS — E7800 Pure hypercholesterolemia, unspecified: Secondary | ICD-10-CM

## 2020-12-23 NOTE — Telephone Encounter (Signed)
-----   Message from Sueanne Margarita, MD sent at 12/23/2020 11:40 AM EDT ----- Madaline Brilliant will await for repeat FLP in 8 weeks. K+ improved ----- Message ----- From: Antonieta Iba, RN Sent: 12/23/2020  11:31 AM EDT To: Sueanne Margarita, MD  Patient was fasting. Unsure why FLP was done as he was just changed from atorvastatin to rosuvastatin due to elevated TAGS and these were supposed to be repeated in 8 weeks. BMET was only lab that was supposed to be drawn after switching from lisinopril to amlodipine due to elevated K+

## 2020-12-29 MED ORDER — METOPROLOL SUCCINATE ER 50 MG PO TB24: 50.0000 mg | ORAL_TABLET | Freq: Every day | ORAL | 3 refills | Status: DC

## 2021-01-04 MED ORDER — METOPROLOL SUCCINATE ER 50 MG PO TB24: 75.0000 mg | ORAL_TABLET | Freq: Every day | ORAL | 3 refills | Status: DC

## 2021-01-04 NOTE — Addendum Note (Signed)
Addended by: Antonieta Iba on: 01/04/2021 12:51 PM   Modules accepted: Orders

## 2021-01-05 ENCOUNTER — Telehealth: Payer: Self-pay | Admitting: *Deleted

## 2021-01-05 NOTE — Telephone Encounter (Signed)
-----   Message from Antonieta Iba, RN sent at 12/15/2020  9:39 AM EDT ----- Home sleep study has been ordered.  Thanks!

## 2021-01-05 NOTE — Telephone Encounter (Signed)
Prior Authorization for HST sent to [employer] via web portal.   Order ID: [id number] In Progress   Anticipated Determination Date:01/07/2021

## 2021-01-06 NOTE — Telephone Encounter (Addendum)
Prior Authorization for HST sent to [employer] via web portal. APPROVED: AUTH# [id number].

## 2021-01-06 NOTE — Telephone Encounter (Signed)
Informed patient of upcoming home sleep study and patient understanding was verbalized.  Patient understands her/his HST is scheduled for 02/03/21 at 12. Pt is aware of testing date.

## 2021-01-10 ENCOUNTER — Encounter: Payer: Self-pay | Admitting: Neurology

## 2021-01-11 ENCOUNTER — Encounter: Payer: Self-pay | Admitting: Neurology

## 2021-01-11 ENCOUNTER — Telehealth (INDEPENDENT_AMBULATORY_CARE_PROVIDER_SITE_OTHER): Payer: BC Managed Care – PPO | Admitting: Neurology

## 2021-01-11 ENCOUNTER — Telehealth: Payer: Self-pay | Admitting: Neurology

## 2021-01-11 DIAGNOSIS — Z789 Other specified health status: Secondary | ICD-10-CM | POA: Diagnosis not present

## 2021-01-11 DIAGNOSIS — I10 Essential (primary) hypertension: Secondary | ICD-10-CM | POA: Insufficient documentation

## 2021-01-11 DIAGNOSIS — G4733 Obstructive sleep apnea (adult) (pediatric): Secondary | ICD-10-CM

## 2021-01-11 DIAGNOSIS — R0683 Snoring: Secondary | ICD-10-CM

## 2021-01-11 DIAGNOSIS — G4719 Other hypersomnia: Secondary | ICD-10-CM | POA: Diagnosis not present

## 2021-01-11 MED ORDER — METOPROLOL SUCCINATE ER 100 MG PO TB24: 100.0000 mg | ORAL_TABLET | Freq: Every day | ORAL | 3 refills | Status: DC

## 2021-01-11 NOTE — Progress Notes (Signed)
SLEEP MEDICINE CLINIC    Provider:  Larey Seat, MD  Primary Care Physician:  [name], [address] [place] [address]     Referring Provider: Lorrene Reid, [address] [place],  [place] [address]     Virtual Visit via Video Note  I connected with Matthew [name] on 01/11/21 at  2:30 PM EDT by a video enabled telemedicine application and verified that I am speaking with the correct person using two identifiers.  Location: Patient: in his car  Provider: at her office.    I discussed the limitations of evaluation and management by telemedicine and the availability of in person appointments. The patient expressed understanding and agreed to proceed.  Matthew [name] is a 60 year- old Caucasian male patient seen here in his first post CPAP RV on 01/11/2021.     History of Present Illness: see below.     Observations/Objective:After spending a total time of 25 minutes of video visit  :  We discussed the results of last years sleeps study and CPAP titration.  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 Hypoxia was a nadir of 79% saturation and total desaturation time of 25 minutes.  Moderate snoring.  Severe periodic limb movements.  During titration study on 09-22-2019 he was fitted with a full facemask, CPAP was titrated to 9 cmH2O under which the patient reached an AHI of only 1.1/h.  The titration was incomplete but documented that REM sleep was reached on the CPAP and that the hypoxia improved.  The oxygen nadir was now 86%.  There were still 17 arousals associated with periodic limb movements.  None of these periodic limb movements occurred in REM sleep and the arousal index was lower than in his baseline study at 0.8/h.   Had undergone in lab PSG and CPAP titration, but was not happy with the  CPAP effect. While he has been fitted in lab for a VITERA FFM, he reports this didn't work so well for him at home and he quit using CPAP, feeling it was  harder to sleep well with CPAP than without.  Part of his problem is likely the facial hair, lack of seal. He repots waking with a very dry mouth and being unable to fall asleep again after 3.30 AM, while he had no problem going to sleep at 11 PM.   The patient further reports that he has no aerophagia, that he feels that his mouth drops open at night in spite of the full facemask which interferes also with his compliance.  He does have facial hair.  He wakes up with frequent nocturia and dry mouth.  His hypertension in the morning is again not well controlled but apparently this was confirmed after he had discontinued using his CPAP.   He had met with his cardiologist, Dr. Golden Hurter, who now referred him for an ENT evaluation for possible inspire device use, DR Redmond Baseman,    This pleasant patient remains with a dx of OSA- in the setting of likely ETOH related HTN, morning lightheadedness- headaches. LFTs indicate ongoing irritation of the liver ,leading to dehydration and aldehyde headaches.     He remains excessively daytime sleepy,  How likely are you to doze in the following situations: 0 = not likely, 1 = slight chance, 2 = moderate  chance, 3 = high chance  Sitting and Reading? Watching Television? Sitting inactive in a public place (theater or meeting)? Lying down in the afternoon when circumstances permit? Sitting and talking to someone? Sitting quietly after lunch without alcohol? In a car, while stopped for a few minutes in traffic? As a passenger in a car for an hour without a break?  Total = 15 / 24   He is interested in inspire, but his apnea type is not optimal for this device. The patient was diagnosed with REM sleep dependent Apnea and some hypoxia. He had PLms.    My Plan is to proceed with:  1)The patient needs to come  in in person and bring his CPAP, mask and cable ( all equipment !) with him.  I like to offer him a 4.15 PM appointment and we can use the sleep lab for a refitting and for some desensitization   I will need a new BMI at that time , too.    Follow Up Instructions:    I discussed the assessment and treatment plan with the patient. The patient was provided an opportunity to ask questions and all were answered. The patient agreed with the plan and demonstrated an understanding of the instructions.   The patient was advised to see me in  an in-person evaluation I provided 25 minutes of non-face-to-face time during this encounter.   Melvyn Novas, MD        Chief Complaint according to patient   Patient presents with:     New Patient (Initial Visit)     pt alone, rm 11. presents today for complaints of having elevated BP in the monring. He has  been told he snores and cardio MD's want him to be evaluated for OSA. He never had a SS       HISTORY OF PRESENT ILLNESS:  this is his past visit with me, CONSULT : Chief concern according to patient : Matthew [name] is seen here today as a new patient to my practice he has just had a new primary care PA assigned and a concern of elevated blood pressures in the morning, sometimes associated with tinnitus or dizziness or headaches has been raised.  Since these are symptoms that seem to be worse in the morning of relation to sleep apnea is possible.  The patient also feels that he has not the same energy level and he feels more sleepy in daytime.  The patient controlled his blood pressure regularly at home and systolic blood pressures range from 140s to 180s and diastolics from 90-100.  Pulse rate is also elevated.  He reports photophobia in the morning. He drinks alcohol, bourbon, wine, beer and feels the related relaxation helps to lower BP. [facility] on [phone] and was worked up for stroke but nothing else was followed up on.   The day before he saw his primary care provider he had reportedly felt fuzzy and slightly off balance which resolved after 15 minutes.   I have the pleasure of seeing Matthew [name] today, a right-handed Caucasian male and former smoker, with a possible sleep disorder. He   has a past medical history of Acute bronchitis (03/30/2017), Acute maxillary sinusitis (07/25/2017), Acute otitis media, right (05/05/2016), Acute respiratory failure with hypoxia (HCC) (03/30/2017), CAP (community acquired pneumonia) (03/31/2017), COPD (chronic obstructive pulmonary disease) (HCC), Diarrhea (10/16/2017), Diverticulitis, Ear build-up, left (08/10/2017), Hypertension, Left otitis media (07/17/2017), Pharyngitis (05/05/2016), and Sigmoid diverticulitis (02/11/2018).  Sleep relevant medical history: high morning blood pressure .   Family medical /sleep history: no  other family member on CPAP with OSA, insomnia, sleep walkers.    Social history:  Patient is working as a Emergency planning/management officer and lives in a household  alone. Family status is widowed  with adult children his daughter is an ICU nurse.  The patient currently works by [employer]. Pets are not present. Tobacco use - quit 03-2017 .  ETOH use - yes , up to 3 a week end day, averaging 7-10 a week.  Caffeine intake in form of Coffee( 1 cup in ) Soda( dr pepper 3/ week) Tea ( none ) or energy drinks. Regular exercise in form of his job.    Hobbies : none    Sleep habits are as follows: The patient's dinner time is between 7-8 PM. The patient goes to bed at 11 PM and continues to sleep for 2-3  hours, wakes for a bathroom breaks, the first time at 3.30 AM.   He then will wake up every hour and looks at the clock-  The preferred sleep position is variable, just not prone, with the support of 1 pillow.  Dreams are reportedly rare. He sleeps on average 5 hours, he thinks.  The patient wakes up with an alarm, 7.30  AM is the usual rise time.  He reports not feeling refreshed  or restored in AM, with symptoms such as dry mouth, morning headaches and dizziness, light headedness- residual fatigue. Naps are taken frequently, lasting from 20-30 minutes and are not more refreshing than nocturnal sleep.    Review of Systems: Out of a complete 14 system review, the patient complains of only the following symptoms, and all other reviewed systems are negative.:    Fatigue, sleepiness , snoring, fragmented sleep, Insomnia -   How likely are you to doze in the following situations: 0 = not likely, 1 = slight chance, 2 = moderate chance, 3 = high chance   Sitting and Reading? Watching Television? Sitting inactive in a public place (theater or meeting)? As a passenger in a car for an hour without a break? Lying down in the afternoon when circumstances permit? Sitting and talking to someone? Sitting quietly after lunch without alcohol? In a car, while stopped for a few minutes in traffic?   Total = 17/ 24 points   FSS endorsed at 40/ 63 points.   Social History   Socioeconomic History   Marital status: Widowed    Spouse name: Not on file   Number of children: 1   Years of education: Not on file   Highest education level: Not on file  Occupational History   Not on file  Tobacco Use   Smoking status: Former    Packs/day: 1.00    Types: Cigarettes    Quit date: 03/30/2017    Years since quitting: 3.7   Smokeless tobacco: Never  Vaping Use   Vaping Use: Never used  Substance and Sexual Activity   Alcohol use: Not on file    Comment: 7 cans of beer a week.   Drug use: Not Currently    Frequency: 2.0 times per week    Types: Marijuana   Sexual activity: Yes    Birth control/protection: None  Other Topics Concern   Not on file  Social History Narrative   Not on file   Social Determinants of Health   Financial Resource Strain: Not on file  Food Insecurity: Not on  file  Transportation Needs: Not on file  Physical Activity: Not on file  Stress: Not on  file  Social Connections: Not on file    Family History  Problem Relation Age of Onset   Lymphoma Father    Cancer Mother        breast   Colon cancer Neg Hx    Rectal cancer Neg Hx    Stomach cancer Neg Hx     Past Medical History:  Diagnosis Date   Acute bronchitis 03/30/2017   Acute maxillary sinusitis 07/25/2017   Acute otitis media, right 05/05/2016   Acute respiratory failure with hypoxia ([place]) 03/30/2017   CAP (community acquired pneumonia) 03/31/2017   COPD (chronic obstructive pulmonary disease) ([place])    Diarrhea 10/16/2017   Diverticulitis    Ear build-up, left 08/10/2017   Hypertension    Left otitis media 07/17/2017   Pharyngitis 05/05/2016   Sigmoid diverticulitis 02/11/2018    Past Surgical History:  Procedure Laterality Date   [place]     Current Outpatient Medications on File Prior to Visit  Medication Sig Dispense Refill   amLODipine (NORVASC) 5 MG tablet Take 1 tablet (5 mg total) by mouth daily. 90 tablet 3   clonazePAM (KLONOPIN) 0.5 MG tablet Take 0.5 tablets (0.25 mg total) by mouth at bedtime. 45 tablet 1   famotidine-calcium carbonate-magnesium hydroxide (PEPCID COMPLETE) 10-800-165 MG chewable tablet Chew 1 tablet by mouth daily as needed.     metoprolol succinate (TOPROL-XL) 100 MG 24 hr tablet Take 1 tablet (100 mg total) by mouth daily. Take with or immediately following a meal. 90 tablet 3   rosuvastatin (CRESTOR) 40 MG tablet Take 1 tablet (40 mg total) by mouth daily. 90 tablet 3   Vitamin D, Ergocalciferol, (DRISDOL) 1.25 MG (50000 UNIT) CAPS capsule TAKE 1 CAPSULE BY MOUTH EVERY 7 DAYS (Patient not taking: Reported on 12/15/2020) 4 capsule 0   No current facility-administered medications on file prior to visit.    No Known Allergies  Physical exam:  There were no vitals filed for this visit.  There is no height or weight on file to calculate BMI.   Wt Readings from Last 3 Encounters:  12/15/20 178 lb (80.7 kg)   11/07/19 172 lb 9.6 oz (78.3 kg)  09/13/19 175 lb 14.4 oz (79.8 kg)     Ht Readings from Last 3 Encounters:  12/15/20 $RemoveB'5\' 6"'HrknjUZp$  (1.676 m)  11/07/19 $RemoveB'5\' 3"'QWNJjTph$  (1.6 m)  09/13/19 $RemoveB'5\' 3"'gQsFPeWC$  (1.6 m)      General: The patient is awake, alert and appears not in acute distress. The patient is groomed. Some facial redness, sun exposure ?   Neurologic exam : The patient is awake and alert, oriented to place and time.   Memory subjective described as intact.  Attention span & concentration ability appears normal.  Speech is fluent,  without  dysarthria, dysphonia or aphasia.  Mood and affect are appropriate.     In short, Matthew [name] is presenting with morning high BP.Matthew [name] is a 60 year- old Caucasian male patient seen here in his first post CPAP RV on 01/11/2021.     CC: I will share my notes with PA Abonza   Electronically signed by: Larey Seat, MD 01/11/2021 2:43 PM  [facility] Board certified by The [employer] of Sleep Medicine and Diplomate of the [employer] of Sleep Medicine. Board certified In Neurology through the  ABPN, Fellow of the [employer] of Neurology. Medical Director of [facility].

## 2021-01-11 NOTE — Telephone Encounter (Signed)
Spoke with the patient who confirms he has been taking Toprol XL 75 mg daily. He will increase Toprol XL to 100 mg daily. He has been scheduled to see PharmD in one week and will have lab work done the same day. Renal artery duplex has been ordered. Patient is aware that we will call him to schedule. Patient is already scheduled for a home sleep study 11/9

## 2021-01-11 NOTE — Patient Instructions (Signed)
In person visit to be scheduled for refitting and for questions of CPAP use.  This will be a 4 .15 Pm visits in the sleep lab.   He needs to bring his CPAP equipment with him.

## 2021-01-11 NOTE — Addendum Note (Signed)
Addended by: Antonieta Iba on: 01/11/2021 01:27 PM   Modules accepted: Orders

## 2021-01-11 NOTE — Telephone Encounter (Signed)
..   Pt understands that although there may be some limitations with this type of visit, we will take all precautions to reduce any security or privacy concerns.  Pt understands that this will be treated like an in office visit and we will file with pt's insurance, and there may be a patient responsible charge related to this service. ? ?

## 2021-01-18 NOTE — Progress Notes (Signed)
Patient ID: DREQUAN IRONSIDE                 DOB: 19-Mar-1961                      MRN: [id number]     HPI: Matthew [name] is a 60 y.o. male referred by Dr. Radford Pax to HTN clinic. PMH is significant for HTN, HLD, COPD. Last seen by Dr. Radford Pax 12/15/20, BP was 136/84. BMET revealed elevated potassium of 5.5 so lisinopril was switched to amlodipine. The patient then sent in home BP readings 12/16/20 that were elevated 161/96 to 177/108, HR 80s. Metoprolol succinate was increased to 75 mg and a week later some home BP readings had improved but still had elevated readings 170s/90s. It was discovered that he had been taking metoprolol tartrate instead of succinate so this was changed to metoprolol succinate 50 mg daily but BP readings remained elevated and it was increased to 75 mg then again to 100 mg and pt was referred to HTN clinic.   Today, patient arrives in good spirits, states that he is doing well on his current medications with no adverse effects. He purchased a new BP cuff about a month ago. Brings log of readings from the last week. He denies dizziness, headache, swelling, blurred vision. Notes that his vision "seems brighter" when his BP is high at home but that he has some eye issues being followed by his eye doctor. Otherwise he cannot tell when his BP is normal vs high. He takes his BP medications at night and checks his BP in the morning. Technique appears appropriate. Over the last two weeks, he says he may have missed 1 day of medications, otherwise endorses adherence.   Current HTN meds: amlodipine 5 mg daily, metoprolol succinate 100 mg daily (takes both at night) Previously tried: chlorthalidone (severe leg cramps), lisinopril 20 mg (hyperkalemia) BP goal: <130/80 mmHg  Family History: The patient's family history includes Cancer in his mother; Lymphoma in his father.   Social History: Former smoker (quit 2019)  Diet: 1-2 cups of coffee in the morning, caffeine free sodas, rarely adds  salt to foods, doesn't read nutrition labels to track sodium  Exercise: works at a [employer], walks a bit, shovels  Home BP readings: Bicep cuff, bought 1 month ago over the last week: 167/96, 171/94, 161/83, 171/102, 136/80, 141/82, 169/95, 156/92; HR 77 to 89  Labs:  -12/21/20: Scr 1.08, K 4.3 (after discontinuing lisinopril 9/21) -12/15/20: Scr 1.10, K 5.5 (lisinopril 20mg )  Wt Readings from Last 3 Encounters:  12/15/20 178 lb (80.7 kg)  11/07/19 172 lb 9.6 oz (78.3 kg)  09/13/19 175 lb 14.4 oz (79.8 kg)   BP Readings from Last 3 Encounters:  12/15/20 136/84  11/07/19 120/60  09/13/19 122/70   Pulse Readings from Last 3 Encounters:  12/15/20 78  11/07/19 61  09/13/19 76    Renal function: CrCl cannot be calculated (Patient's most recent lab result is older than the maximum 21 days allowed.).  Past Medical History:  Diagnosis Date   Acute bronchitis 03/30/2017   Acute maxillary sinusitis 07/25/2017   Acute otitis media, right 05/05/2016   Acute respiratory failure with hypoxia ([place]) 03/30/2017   CAP (community acquired pneumonia) 03/31/2017   COPD (chronic obstructive pulmonary disease) ([place])    Diarrhea 10/16/2017   Diverticulitis    Ear build-up, left 08/10/2017   Hypertension    Left otitis media 07/17/2017   Pharyngitis  05/05/2016   Sigmoid diverticulitis 02/11/2018    Current Outpatient Medications on File Prior to Visit  Medication Sig Dispense Refill   amLODipine (NORVASC) 5 MG tablet Take 1 tablet (5 mg total) by mouth daily. 90 tablet 3   clonazePAM (KLONOPIN) 0.5 MG tablet Take 0.5 tablets (0.25 mg total) by mouth at bedtime. 45 tablet 1   famotidine-calcium carbonate-magnesium hydroxide (PEPCID COMPLETE) 10-800-165 MG chewable tablet Chew 1 tablet by mouth daily as needed.     metoprolol succinate (TOPROL-XL) 100 MG 24 hr tablet Take 1 tablet (100 mg total) by mouth daily. Take with or immediately following a meal. 90 tablet 3   rosuvastatin  (CRESTOR) 40 MG tablet Take 1 tablet (40 mg total) by mouth daily. 90 tablet 3   Vitamin D, Ergocalciferol, (DRISDOL) 1.25 MG (50000 UNIT) CAPS capsule TAKE 1 CAPSULE BY MOUTH EVERY 7 DAYS (Patient not taking: Reported on 12/15/2020) 4 capsule 0   No current facility-administered medications on file prior to visit.    No Known Allergies   Assessment/Plan:  1. Hypertension - BP in clinic today of 140/88 above goal <130/80 mmHg. HR 84. Will increase amlodipine to 10 mg daily and continue metoprolol succinate 100 mg daily. Counseled him to read his nutrition labels and track sodium intake on a normal day of eating so he can see how much sodium he eats in a day. Counseled him to make adjustments following that to ensure sodium intake is <2,000 mg in a day. He has had his labs drawn today to check TSH and renin/aldosterone ordered by Dr. Radford Pax. He is also scheduled for a renal duplex US on 11/2 and sleep study which he says is also followed by Dr. Brett Fairy. Follow up scheduled with HTN clinic in 1 month to correlate with his next lab visit to check lipids. Instructed him to continue checking BP at home and bring his BP cuff with him to that visit so we can check it for accuracy.    Rebbeca Paul, PharmD PGY2 Ambulatory Care Pharmacy Resident 01/19/2021 9:32 AM

## 2021-01-19 ENCOUNTER — Other Ambulatory Visit: Payer: BC Managed Care – PPO

## 2021-01-19 ENCOUNTER — Other Ambulatory Visit: Payer: Self-pay

## 2021-01-19 ENCOUNTER — Ambulatory Visit (INDEPENDENT_AMBULATORY_CARE_PROVIDER_SITE_OTHER): Payer: BC Managed Care – PPO | Admitting: Student-PharmD

## 2021-01-19 VITALS — BP 140/88 | HR 84

## 2021-01-19 DIAGNOSIS — I10 Essential (primary) hypertension: Secondary | ICD-10-CM

## 2021-01-19 MED ORDER — AMLODIPINE BESYLATE 10 MG PO TABS: 10.0000 mg | ORAL_TABLET | Freq: Every day | ORAL | 3 refills | Status: DC

## 2021-01-19 NOTE — Patient Instructions (Signed)
It was nice to see you today!  Your goal blood pressure is less than 130/80 mmHg. In clinic, your blood pressure was 140/88 mmHg.  Medication Changes: Increase amlodipine to 10 mg daily  Continue metoprolol succinate 100 mg daily  Please bring your blood pressure cuff with you to your next visit.   Monitor blood pressure at home daily and keep a log (on your phone or piece of paper) to bring with you to your next visit. Write down date, time, blood pressure and pulse.  Keep up the good work with diet and exercise. Aim for a diet full of vegetables, fruit and lean meats (chicken, [place], fish). Try to limit salt intake by eating fresh or frozen vegetables (instead of canned), rinse canned vegetables prior to cooking and do not add any additional salt to meals.

## 2021-01-21 DIAGNOSIS — H35713 Central serous chorioretinopathy, bilateral: Secondary | ICD-10-CM | POA: Diagnosis not present

## 2021-01-21 DIAGNOSIS — H18413 Arcus senilis, bilateral: Secondary | ICD-10-CM | POA: Diagnosis not present

## 2021-01-21 DIAGNOSIS — H2513 Age-related nuclear cataract, bilateral: Secondary | ICD-10-CM | POA: Diagnosis not present

## 2021-01-21 LAB — ALDOSTERONE + RENIN ACTIVITY W/ RATIO
ALDOS/RENIN RATIO: 6.8 (ref 0.0–30.0)
ALDOSTERONE: 3 ng/dL (ref 0.0–30.0)
Renin: 0.44 ng/mL/hr (ref 0.167–5.380)

## 2021-01-21 LAB — TSH: TSH: 1.24 u[IU]/mL (ref 0.450–4.500)

## 2021-01-27 ENCOUNTER — Encounter: Payer: Self-pay | Admitting: Cardiology

## 2021-01-27 ENCOUNTER — Ambulatory Visit (HOSPITAL_COMMUNITY)
Admission: RE | Admit: 2021-01-27 | Discharge: 2021-01-27 | Disposition: A | Discharge status: Home / Self Care | Payer: BC Managed Care – PPO | Source: Ambulatory Visit | Attending: Cardiology | Admitting: Cardiology

## 2021-01-27 ENCOUNTER — Other Ambulatory Visit: Payer: Self-pay

## 2021-01-27 DIAGNOSIS — I10 Essential (primary) hypertension: Secondary | ICD-10-CM

## 2021-01-27 DIAGNOSIS — I701 Atherosclerosis of renal artery: Secondary | ICD-10-CM | POA: Insufficient documentation

## 2021-02-03 ENCOUNTER — Encounter (HOSPITAL_BASED_OUTPATIENT_CLINIC_OR_DEPARTMENT_OTHER): Payer: BC Managed Care – PPO | Admitting: Cardiology

## 2021-02-05 DIAGNOSIS — I10 Essential (primary) hypertension: Secondary | ICD-10-CM

## 2021-02-09 ENCOUNTER — Telehealth: Payer: Self-pay | Admitting: Cardiology

## 2021-02-09 MED ORDER — HYDROCHLOROTHIAZIDE 12.5 MG PO CAPS
12.5000 mg | ORAL_CAPSULE | Freq: Every day | ORAL | 3 refills | Status: DC
Start: 2021-02-09 — End: 2022-05-18

## 2021-02-09 NOTE — Telephone Encounter (Signed)
Pt c/o swelling: STAT is pt has developed SOB within 24 hours  If swelling, where is the swelling located? Right leg only from the knee down  How much weight have you gained and in what time span? Not sure. Pt does not weigh himself daily   Have you gained 3 pounds in a day or 5 pounds in a week?   Do you have a log of your daily weights (if so, list)? no  Are you currently taking a fluid pill? no  Are you currently SOB? no  Have you traveled recently? No  Patient said his leg is uncomfortable.He had a friend who saw his leg yesterday and was advised to have someone look at his leg. He is not sure what to do

## 2021-02-09 NOTE — Telephone Encounter (Signed)
Left message for patient to call back  

## 2021-02-10 NOTE — Telephone Encounter (Signed)
Left message for patient to call back  

## 2021-02-10 NOTE — Telephone Encounter (Signed)
Spoke with the patient who reports that he has swelling in his right leg. He states that is started over the weekend. He does not recall injuring the leg at all. He denies any swelling in his left leg. States that swelling in all throughout his calf and ankle. He denies any pain, numbness, tingling, redness, or warmth to the leg or foot. States that he has been elevating his leg frequently and trying to stay off of it. Swelling has improved since Sunday. He states that when his friend was pressing on his leg the other day it would leave an indention for a couple of minutes. Patient denies any shortness of breath or chest pain. Patient was just prescribed HCTZ that he just picked up today but has not started taking. Advised to continuing elevating and call if additional symptoms develop.

## 2021-02-10 NOTE — Telephone Encounter (Signed)
Pt is returning call.  

## 2021-02-10 NOTE — Telephone Encounter (Signed)
Patient returning call.

## 2021-02-11 ENCOUNTER — Other Ambulatory Visit: Payer: Self-pay

## 2021-02-11 ENCOUNTER — Encounter (HOSPITAL_COMMUNITY): Payer: Self-pay | Admitting: Emergency Medicine

## 2021-02-11 ENCOUNTER — Encounter: Payer: Self-pay | Admitting: Cardiology

## 2021-02-11 ENCOUNTER — Emergency Department (HOSPITAL_COMMUNITY)
Admission: EM | Admit: 2021-02-11 | Discharge: 2021-02-11 | Disposition: A | Discharge status: Home / Self Care | Payer: BC Managed Care – PPO | Attending: Emergency Medicine | Admitting: Emergency Medicine

## 2021-02-11 ENCOUNTER — Emergency Department (HOSPITAL_BASED_OUTPATIENT_CLINIC_OR_DEPARTMENT_OTHER): Payer: BC Managed Care – PPO

## 2021-02-11 DIAGNOSIS — Z5321 Procedure and treatment not carried out due to patient leaving prior to being seen by health care provider: Secondary | ICD-10-CM | POA: Diagnosis not present

## 2021-02-11 DIAGNOSIS — M7989 Other specified soft tissue disorders: Secondary | ICD-10-CM | POA: Insufficient documentation

## 2021-02-11 DIAGNOSIS — R609 Edema, unspecified: Secondary | ICD-10-CM

## 2021-02-11 DIAGNOSIS — R2241 Localized swelling, mass and lump, right lower limb: Secondary | ICD-10-CM | POA: Diagnosis not present

## 2021-02-11 LAB — COMPREHENSIVE METABOLIC PANEL
ALT: 39 U/L (ref 0–44)
AST: 32 U/L (ref 15–41)
Albumin: 4.2 g/dL (ref 3.5–5.0)
Alkaline Phosphatase: 88 U/L (ref 38–126)
Anion gap: 11 (ref 5–15)
BUN: 13 mg/dL (ref 6–20)
CO2: 27 mmol/L (ref 22–32)
Calcium: 9.7 mg/dL (ref 8.9–10.3)
Chloride: 98 mmol/L (ref 98–111)
Creatinine, Ser: 0.99 mg/dL (ref 0.61–1.24)
GFR, Estimated: >60 mL/min (ref >60)
Glucose, Bld: 99 mg/dL (ref 70–99)
Potassium: 3.8 mmol/L (ref 3.5–5.1)
Sodium: 136 mmol/L (ref 135–145)
Total Bilirubin: 0.6 mg/dL (ref 0.3–1.2)
Total Protein: 7.3 g/dL (ref 6.5–8.1)

## 2021-02-11 LAB — CBC WITH DIFFERENTIAL/PLATELET
Abs Immature Granulocytes: 0.03 10*3/uL (ref 0.00–0.07)
Basophils Absolute: 0.1 10*3/uL (ref 0.0–0.1)
Basophils Relative: 1 %
Eosinophils Absolute: 0.2 10*3/uL (ref 0.0–0.5)
Eosinophils Relative: 3 %
HCT: 43.9 % (ref 39.0–52.0)
Hemoglobin: 14.4 g/dL (ref 13.0–17.0)
Immature Granulocytes: 0 %
Lymphocytes Relative: 23 %
Lymphs Abs: 1.9 10*3/uL (ref 0.7–4.0)
MCH: 31.2 pg (ref 26.0–34.0)
MCHC: 32.8 g/dL (ref 30.0–36.0)
MCV: 95 fL (ref 80.0–100.0)
Monocytes Absolute: 1.1 10*3/uL — ABNORMAL HIGH (ref 0.1–1.0)
Monocytes Relative: 13 %
Neutro Abs: 4.9 10*3/uL (ref 1.7–7.7)
Neutrophils Relative %: 60 %
Platelets: 270 10*3/uL (ref 150–400)
RBC: 4.62 MIL/uL (ref 4.22–5.81)
RDW: 12.2 % (ref 11.5–15.5)
WBC: 8.2 10*3/uL (ref 4.0–10.5)
nRBC: 0 % (ref 0.0–0.2)

## 2021-02-11 NOTE — Progress Notes (Signed)
Right lower extremity venous duplex completed. Refer to "CV Proc" under chart review to view preliminary results.  02/11/2021 7:08 PM Kelby Aline., MHA, RVT, RDCS, RDMS

## 2021-02-11 NOTE — ED Triage Notes (Addendum)
Pt presents to ED POV. Pt c/o R leg swelling. Pt reports that swelling began in knee "a while ago" but this week it extended down into his leg and ankle. Pt reports started taking diuretic last night.

## 2021-02-11 NOTE — ED Notes (Signed)
LWBS 

## 2021-02-11 NOTE — ED Provider Notes (Signed)
Emergency Medicine Provider Triage Evaluation Note  Matthew [name] , a 60 y.o. male  was evaluated in triage.  Pt complains of right leg swelling for the past 4-5 days, first noticed knee swelling 2 weeks ago but this has improved. No falls or injuries. No numbness or weakness. Not anticoagulated. No history of PE or DVT. Described as tightness in the calf.  Review of Systems  Positive: Leg pain, swelling Negative: SHOB  Physical Exam  BP 125/71 (BP Location: Right Arm)   Pulse 71   Temp 98.1 F (36.7 C)   Resp 16   Ht 5\' 6"  (1.676 m)   Wt 79.4 kg   SpO2 95%   BMI 28.25 kg/m  Gen:   Awake, no distress   Resp:  Normal effort  MSK:   Moves extremities without difficulty  Other:  DP pulse present, sensation intact. No palpable cords.  Medical Decision Making  Medically screening exam initiated at 5:29 PM.  Appropriate orders placed.  Matthew [name] was informed that the remainder of the evaluation will be completed by another provider, this initial triage assessment does not replace that evaluation, and the importance of remaining in the ED until their evaluation is complete.     Tacy Learn, PA-C 02/11/21 1731    Pattricia Boss, MD 02/14/21 Drema Halon

## 2021-02-11 NOTE — Telephone Encounter (Signed)
Spoke with the patient and gave him recommendations from Dr. Radford Pax. Patient verbalized understanding

## 2021-02-15 ENCOUNTER — Other Ambulatory Visit: Payer: BC Managed Care – PPO | Admitting: *Deleted

## 2021-02-15 ENCOUNTER — Ambulatory Visit (INDEPENDENT_AMBULATORY_CARE_PROVIDER_SITE_OTHER): Payer: BC Managed Care – PPO | Admitting: Pharmacist

## 2021-02-15 ENCOUNTER — Other Ambulatory Visit: Payer: Self-pay

## 2021-02-15 VITALS — BP 132/80 | HR 75

## 2021-02-15 DIAGNOSIS — I10 Essential (primary) hypertension: Secondary | ICD-10-CM | POA: Diagnosis not present

## 2021-02-15 DIAGNOSIS — E7800 Pure hypercholesterolemia, unspecified: Secondary | ICD-10-CM | POA: Diagnosis not present

## 2021-02-15 NOTE — Progress Notes (Addendum)
Patient ID: Matthew [name]                 DOB: Nov 09, 1960                      MRN: [id number]     HPI: Matthew [name] is a 60 y.o. male referred by Dr. Radford Pax to HTN clinic. PMH is significant for HTN, HLD, COPD. Last seen by Dr. Radford Pax 12/15/20, BP was 136/84. BMET revealed elevated potassium of 5.5 so lisinopril was switched to amlodipine. After the past few visits, Toprol has been increased to 100mg  daily and most recently amlodipine was increased to 10mg  daily. He underwent renal artery ultrasound 01/27/21 which showed bilateral 1-59% renal artery stenosis. He sent in MyChart message 11/11 with reports of continued elevated BP. He was started on low dose HCTZ 12.5mg  daily. He reported right lower extremity swelling on 11/15, lower extremity ultrasound 11/17 was negative for DVT.  Pt reports tolerating medications well. Occasional lightheadedness if he stands too quickly. Reports medication compliance. Brings in home bicep BP cuff which is measuring well. 128/86 home reading vs 132/80 clinic reading. He started HCTZ on 11/17 so has been taking for the past 4-5 days. BP readings have improved since then down to the 130s (previously 140s-150s on higher dose of amlodipine, and 160s-170s before amlodipine dose was increased). He takes his BP medications at night and checks his BP in the morning. Gets up to use the bathroom at night but this hasn't worsened since starting HCTZ. Easier for him to continue taking all meds at night. Still reports some swelling in his right leg, plans to get checked out by ortho. Discussed that higher dose of amlodipine can cause ankle/foot swelling but this is usually bilateral and pt's swelling is from the knee down on just one side; he does not feel it's related to higher dose of amlodipine.  Current HTN meds: amlodipine 10mg  daily, HCTZ 12.5mg  daily, metoprolol succinate 100 mg daily (takes all at night) Previously tried: chlorthalidone (severe leg cramps), lisinopril 20  mg (hyperkalemia) BP goal: <130/80 mmHg  Family History: The patient's family history includes Cancer in his mother; Lymphoma in his father.   Social History: Former smoker (quit 2019)  Diet: 1-2 cups of coffee in the morning, caffeine free sodas, rarely adds salt to foods, doesn't read nutrition labels to track sodium  Exercise: works at a [employer], walks a bit, shovels  Home BP readings: Bicep cuff, bought 1 month ago over the last week: 167/96, 171/94, 161/83, 171/102, 136/80, 141/82, 169/95, 156/92; HR 77 to 89  Labs:  -12/21/20: Scr 1.08, K 4.3 (after discontinuing lisinopril 9/21) -12/15/20: Scr 1.10, K 5.5 (lisinopril 20mg )  Wt Readings from Last 3 Encounters:  02/11/21 175 lb (79.4 kg)  12/15/20 178 lb (80.7 kg)  11/07/19 172 lb 9.6 oz (78.3 kg)   BP Readings from Last 3 Encounters:  02/11/21 140/81  01/19/21 140/88  12/15/20 136/84   Pulse Readings from Last 3 Encounters:  02/11/21 76  01/19/21 84  12/15/20 78    Renal function: Estimated Creatinine Clearance: 78.6 mL/min (by C-G formula based on SCr of 0.99 mg/dL).  Past Medical History:  Diagnosis Date   Acute bronchitis 03/30/2017   Acute maxillary sinusitis 07/25/2017   Acute otitis media, right 05/05/2016   Acute respiratory failure with hypoxia ([place]) 03/30/2017   CAP (community acquired pneumonia) 03/31/2017   COPD (chronic obstructive pulmonary disease) ([facility])    Diarrhea  10/16/2017   Diverticulitis    Ear build-up, left 08/10/2017   Hypertension    Left otitis media 07/17/2017   Pharyngitis 05/05/2016   Renal artery stenosis ([facility])    1 to 59% bilateral by Dopplers 12/2020   Sigmoid diverticulitis 02/11/2018    Current Outpatient Medications on File Prior to Visit  Medication Sig Dispense Refill   amLODipine (NORVASC) 10 MG tablet Take 1 tablet (10 mg total) by mouth daily. 90 tablet 3   clonazePAM (KLONOPIN) 0.5 MG tablet Take 0.5 tablets (0.25 mg total) by mouth at bedtime. 45  tablet 1   famotidine-calcium carbonate-magnesium hydroxide (PEPCID COMPLETE) 10-800-165 MG chewable tablet Chew 1 tablet by mouth daily as needed.     hydrochlorothiazide (MICROZIDE) 12.5 MG capsule Take 1 capsule (12.5 mg total) by mouth daily. 30 capsule 3   metoprolol succinate (TOPROL-XL) 100 MG 24 hr tablet Take 1 tablet (100 mg total) by mouth daily. Take with or immediately following a meal. 90 tablet 3   rosuvastatin (CRESTOR) 40 MG tablet Take 1 tablet (40 mg total) by mouth daily. 90 tablet 3   Vitamin D, Ergocalciferol, (DRISDOL) 1.25 MG (50000 UNIT) CAPS capsule TAKE 1 CAPSULE BY MOUTH EVERY 7 DAYS (Patient not taking: Reported on 12/15/2020) 4 capsule 0   No current facility-administered medications on file prior to visit.    No Known Allergies   Assessment/Plan:  1. Hypertension - BP has improved and is now at goal <130/80 mmHg. Will continue amlodipine 10mg  daily, HCTZ 12.5mg  daily, and metoprolol succinate 100mg  daily. Goal 000mg  daily sodium. Home BP cuff is measuring well. BMET checked today with recent thiazide start. Pt aware to call clinic with any concerns. F/u with PharmD as needed.  Loukas Antonson E. Fabrice Dyal, PharmD, BCACP, [place] [address] [place], [place] [address] Phone: [phone]; Fax: [phone] 02/15/2021 8:39 AM

## 2021-02-15 NOTE — Patient Instructions (Signed)
It was nice to see you today!  Your blood pressure goal is < 130/39mmHg  Continue taking your current medications  Aim for < 2,000mg  of sodium each day  Call clinic with any concerns

## 2021-02-16 LAB — BASIC METABOLIC PANEL
BUN/Creatinine Ratio: 16 (ref 10–24)
BUN: 17 mg/dL (ref 8–27)
CO2: 31 mmol/L — ABNORMAL HIGH (ref 20–29)
Calcium: 9.7 mg/dL (ref 8.6–10.2)
Chloride: 97 mmol/L (ref 96–106)
Creatinine, Ser: 1.04 mg/dL (ref 0.76–1.27)
Glucose: 107 mg/dL — ABNORMAL HIGH (ref 70–99)
Potassium: 4 mmol/L (ref 3.5–5.2)
Sodium: 139 mmol/L (ref 134–144)
eGFR: 82 mL/min/{1.73_m2} (ref >59)

## 2021-02-16 LAB — LIPID PANEL
Chol/HDL Ratio: 2.7 ratio (ref 0.0–5.0)
Cholesterol, Total: 127 mg/dL (ref 100–199)
HDL: 47 mg/dL (ref >39)
LDL Chol Calc (NIH): 61 mg/dL (ref 0–99)
Triglycerides: 100 mg/dL (ref 0–149)
VLDL Cholesterol Cal: 19 mg/dL (ref 5–40)

## 2021-02-16 LAB — ALT: ALT: 32 IU/L (ref 0–44)

## 2021-03-09 ENCOUNTER — Telehealth: Payer: Self-pay

## 2021-03-09 NOTE — Telephone Encounter (Signed)
Letter has been sent to patient instructing them to call us if they are still interested in completing their sleep study. If we have not received a response from the patient within 30 days of this notice, the order will be cancelled and they will need to discuss the need for a sleep study at their next office visit.  ° °

## 2021-03-15 ENCOUNTER — Other Ambulatory Visit: Payer: Self-pay | Admitting: Neurology

## 2021-03-15 DIAGNOSIS — R5383 Other fatigue: Secondary | ICD-10-CM

## 2021-03-15 DIAGNOSIS — I158 Other secondary hypertension: Secondary | ICD-10-CM

## 2021-03-15 DIAGNOSIS — R0683 Snoring: Secondary | ICD-10-CM

## 2021-03-15 DIAGNOSIS — G4719 Other hypersomnia: Secondary | ICD-10-CM

## 2021-03-15 DIAGNOSIS — J9601 Acute respiratory failure with hypoxia: Secondary | ICD-10-CM

## 2021-03-16 DIAGNOSIS — I10 Essential (primary) hypertension: Secondary | ICD-10-CM | POA: Diagnosis not present

## 2021-03-16 DIAGNOSIS — R600 Localized edema: Secondary | ICD-10-CM | POA: Diagnosis not present

## 2021-03-16 DIAGNOSIS — E782 Mixed hyperlipidemia: Secondary | ICD-10-CM | POA: Diagnosis not present

## 2021-03-16 NOTE — Telephone Encounter (Signed)
Patient is up to date on his appointments. Patient is due for a refill on klonopin. [place] Controlled Substance Registry checked and is appropriate.

## 2021-04-01 DIAGNOSIS — Z20822 Contact with and (suspected) exposure to covid-19: Secondary | ICD-10-CM | POA: Diagnosis not present

## 2021-04-01 DIAGNOSIS — U071 COVID-19: Secondary | ICD-10-CM | POA: Diagnosis not present

## 2021-04-02 DIAGNOSIS — Z20822 Contact with and (suspected) exposure to covid-19: Secondary | ICD-10-CM | POA: Diagnosis not present

## 2021-04-02 DIAGNOSIS — U071 COVID-19: Secondary | ICD-10-CM | POA: Diagnosis not present

## 2021-04-03 DIAGNOSIS — Z03818 Encounter for observation for suspected exposure to other biological agents ruled out: Secondary | ICD-10-CM | POA: Diagnosis not present

## 2021-04-03 DIAGNOSIS — Z20822 Contact with and (suspected) exposure to covid-19: Secondary | ICD-10-CM | POA: Diagnosis not present

## 2021-05-25 ENCOUNTER — Encounter: Payer: Self-pay | Admitting: Gastroenterology

## 2021-05-30 ENCOUNTER — Encounter: Payer: Self-pay | Admitting: Cardiology

## 2021-05-31 ENCOUNTER — Encounter: Payer: Self-pay | Admitting: Interventional Cardiology

## 2021-05-31 ENCOUNTER — Ambulatory Visit (INDEPENDENT_AMBULATORY_CARE_PROVIDER_SITE_OTHER): Payer: BC Managed Care – PPO | Admitting: Interventional Cardiology

## 2021-05-31 ENCOUNTER — Other Ambulatory Visit: Payer: Self-pay

## 2021-05-31 VITALS — BP 130/70 | HR 89 | Ht 66.0 in | Wt 187.0 lb

## 2021-05-31 DIAGNOSIS — E785 Hyperlipidemia, unspecified: Secondary | ICD-10-CM

## 2021-05-31 DIAGNOSIS — R931 Abnormal findings on diagnostic imaging of heart and coronary circulation: Secondary | ICD-10-CM | POA: Diagnosis not present

## 2021-05-31 DIAGNOSIS — G4733 Obstructive sleep apnea (adult) (pediatric): Secondary | ICD-10-CM

## 2021-05-31 DIAGNOSIS — I10 Essential (primary) hypertension: Secondary | ICD-10-CM

## 2021-05-31 DIAGNOSIS — R079 Chest pain, unspecified: Secondary | ICD-10-CM

## 2021-05-31 DIAGNOSIS — R072 Precordial pain: Secondary | ICD-10-CM

## 2021-05-31 DIAGNOSIS — I700 Atherosclerosis of aorta: Secondary | ICD-10-CM

## 2021-05-31 MED ORDER — ASPIRIN EC 81 MG PO TBEC: 81.0000 mg | DELAYED_RELEASE_TABLET | Freq: Every day | ORAL | 3 refills | Status: DC

## 2021-05-31 MED ORDER — NITROGLYCERIN 0.4 MG SL SUBL: 0.4000 mg | SUBLINGUAL_TABLET | SUBLINGUAL | 3 refills | Status: DC | PRN

## 2021-05-31 MED ORDER — METOPROLOL TARTRATE 50 MG PO TABS: ORAL_TABLET | ORAL | 0 refills | Status: DC

## 2021-05-31 NOTE — Patient Instructions (Addendum)
Medication Instructions:  ?1) START Aspirin 81mg  once daily ?2) A prescription has been sent in for Nitroglycerin.  If you have chest pain that doesn't relieve quickly, place one tablet under your tongue and allow it to dissolve.  If no relief after 5 minutes, you may take another pill.  If no relief after 5 minutes, you may take a 3rd dose but you need to call 911 and report to ER immediately. ? ?*If you need a refill on your cardiac medications before your next appointment, please call your pharmacy* ? ? ?Lab Work: ?BMET and Troponin today ? ?If you have labs (blood work) drawn today and your tests are completely normal, you will receive your results only by: ?MyChart Message (if you have MyChart) OR ?A paper copy in the mail ?If you have any lab test that is abnormal or we need to change your treatment, we will call you to review the results. ? ? ?Testing/Procedures: ?Your physician recommends that you have a Coronary CT performed. ? ? ?Follow-Up: ?At [facility], you and your health needs are our priority.  As part of our continuing mission to provide you with exceptional heart care, we have created designated Provider Care Teams.  These Care Teams include your primary Cardiologist (physician) and Advanced Practice Providers (APPs -  Physician Assistants and Nurse Practitioners) who all work together to provide you with the care you need, when you need it. ? ?We recommend signing up for the patient portal called "MyChart".  Sign up information is provided on this After Visit Summary.  MyChart is used to connect with patients for Virtual Visits (Telemedicine).  Patients are able to view lab/test results, encounter notes, upcoming appointments, etc.  Non-urgent messages can be sent to your provider as well.   ?To learn more about what you can do with MyChart, go to [email or web address].   ? ?Your next appointment:   ?2-4 week(s) after CT ? ?The format for your next appointment:   ?In Person ? ?Provider:    ?Fransico Him, MD   ? ? ?Other Instructions ? ? ? ?Your cardiac CT will be scheduled at one of the below locations:  ? ?[facility] ?[address] ?[place], [place] [address] ?[phone] ? ?OR ? ?[place] ?[address] ?[address] ?[place], [place] [address] ?[phone] ? ?If scheduled at [facility], please arrive at the [facility] (Entrance C2) of [facility] 30 minutes prior to test start time. ?You can use the FREE valet parking offered at entrance C (encouraged to control the heart rate for the test)  ?Proceed to the [facility] Radiology Department (first floor) to check-in and test prep. ? ?All radiology patients and guests should use entrance C2 at [facility], accessed from [facility], even though the hospital's physical address listed is [address]. ? ? ? ?If scheduled at [facility], please arrive 15 mins early for check-in and test prep. ? ?Please follow these instructions carefully (unless otherwise directed): ? ?Hold all erectile dysfunction medications at least 3 days (72 hrs) prior to test. ? ?On the Night Before the Test: ?Be sure to Drink plenty of water. ?Do not consume any caffeinated/decaffeinated beverages or chocolate 12 hours prior to your test. ?Do not take any antihistamines 12 hours prior to your test. ? ?On the Day of the Test: ?Drink plenty of water until 1 hour prior to the test. ?Do not eat  any food 4 hours prior to the test. ?You may take your regular medications prior to the test.  ?Take metoprolol (Lopressor) two hours prior to test. ?HOLD Furosemide/Hydrochlorothiazide morning of the test. ?FEMALES- please wear underwire-free bra if available, avoid dresses & tight clothing ? ?     ?After the Test: ?Drink plenty of water. ?After receiving IV contrast, you may experience a mild flushed feeling. This is normal. ?On occasion, you may  experience a mild rash up to 24 hours after the test. This is not dangerous. If this occurs, you can take Benadryl 25 mg and increase your fluid intake. ?If you experience trouble breathing, this can be serious. If it is severe call 911 IMMEDIATELY. If it is mild, please call our office. ?If you take any of these medications: Glipizide/Metformin, Avandament, Glucavance, please do not take 48 hours after completing test unless otherwise instructed. ? ?We will call to schedule your test 2-4 weeks out understanding that some insurance companies will need an authorization prior to the service being performed.  ? ?For non-scheduling related questions, please contact the cardiac imaging nurse navigator should you have any questions/concerns: ?Marchia Bond, Cardiac Imaging Nurse Navigator ?Gordy Clement, Cardiac Imaging Nurse Navigator ?[facility] ?Direct Office Dial: [phone]  ? ?For scheduling needs, including cancellations and rescheduling, please call [place], [phone].   ?

## 2021-05-31 NOTE — Telephone Encounter (Signed)
Patient has been scheduled with DOD today.  ?

## 2021-05-31 NOTE — Progress Notes (Signed)
Cardiology Office Note:    Date:  05/31/2021   ID:  Matthew [name], DOB [birth year], MRN [id number]  PCP:  [name], MD  Cardiologist:  Fransico Him, MD   Referring MD: [name], MD   Chief Complaint  Patient presents with   Coronary Artery Disease   Chest Pain    History of Present Illness:    Matthew [name] is a 61 y.o. male with a hx of obstructive sleep apnea unable to tolerate CPAP, COPD, primary hypertension, renal artery stenosis, who is being seen today because of prolonged chest pain occurring over the past weekend.   He has a history of nonobstructive coronary disease and obstructive sleep apnea but has not treated with CPAP because he is unable to tolerate the device.  He awakened yesterday at 6 AM with pressure in his chest.  There was no dyspnea, diaphoresis, or other complaint.  The discomfort lasted approximately an hour and then it resolved.  He has been fine since that time.  He has never felt this type discomfort before.  States he had been having increased indigestion, which is a sensation of reflux into his throat.  During the discomfort, he recorded several EKG strips using his Kardio mobile and send them into [place].  The strips did not reveal anything no was helpful or suggestive of ischemia.  EKG in office today is nonischemic and no evidence of infarction is noted.  Past Medical History:  Diagnosis Date   Acute bronchitis 03/30/2017   Acute maxillary sinusitis 07/25/2017   Acute otitis media, right 05/05/2016   Acute respiratory failure with hypoxia ([place]) 03/30/2017   CAP (community acquired pneumonia) 03/31/2017   COPD (chronic obstructive pulmonary disease) ([place])    Diarrhea 10/16/2017   Diverticulitis    Ear build-up, left 08/10/2017   Hypertension    Left otitis media 07/17/2017   Pharyngitis 05/05/2016   Renal artery stenosis (H[facility])    1 to 59% bilateral by Dopplers 12/2020   Sigmoid diverticulitis 02/11/2018    Past Surgical  History:  Procedure Laterality Date   HERNIA REPAIR     HERNIA REPAIR  1966    Current Medications: Current Meds  Medication Sig   amLODipine (NORVASC) 10 MG tablet Take 1 tablet (10 mg total) by mouth daily.   aspirin EC 81 MG tablet Take 1 tablet (81 mg total) by mouth daily. Swallow whole.   clonazePAM (KLONOPIN) 0.5 MG tablet TAKE 1/2 TABLET BY MOUTH AT BEDTIME   famotidine-calcium carbonate-magnesium hydroxide (PEPCID COMPLETE) 10-800-165 MG chewable tablet Chew 1 tablet by mouth daily as needed.   hydrochlorothiazide (MICROZIDE) 12.5 MG capsule Take 1 capsule (12.5 mg total) by mouth daily.   metoprolol succinate (TOPROL-XL) 100 MG 24 hr tablet Take 1 tablet (100 mg total) by mouth daily. Take with or immediately following a meal.   nitroGLYCERIN (NITROSTAT) 0.4 MG SL tablet Place 1 tablet (0.4 mg total) under the tongue every 5 (five) minutes as needed for chest pain.   rosuvastatin (CRESTOR) 40 MG tablet Take 1 tablet (40 mg total) by mouth daily.     Allergies:   Patient has no known allergies.   Social History   Socioeconomic History   Marital status: Widowed    Spouse name: Not on file   Number of children: 1   Years of education: Not on file   Highest education level: Not on file  Occupational History   Not on file  Tobacco Use   Smoking status: Former  Packs/day: 1.00    Types: Cigarettes    Quit date: 03/30/2017    Years since quitting: 4.1   Smokeless tobacco: Never  Vaping Use   Vaping Use: Never used  Substance and Sexual Activity   Alcohol use: Not on file    Comment: 7 cans of beer a week.   Drug use: Not Currently    Frequency: 2.0 times per week    Types: Marijuana   Sexual activity: Yes    Birth control/protection: None  Other Topics Concern   Not on file  Social History Narrative   Not on file   Social Determinants of Health   Financial Resource Strain: Not on file  Food Insecurity: Not on file  Transportation Needs: Not on file   Physical Activity: Not on file  Stress: Not on file  Social Connections: Not on file     Family History: The patient's family history includes Cancer in his mother; Lymphoma in his father. There is no history of Colon cancer, Rectal cancer, or Stomach cancer.  ROS:   Please see the history of present illness.    He has been active today and all day yesterday.  No recurrence of discomfort.  All other systems reviewed and are negative.  EKGs/Labs/Other Studies Reviewed:    The following studies were reviewed today:  Coronary calcium score 08/08/2019: IMPRESSION: Coronary calcium score of 331. This was 89th percentile for age and sex matched control.  Myocardial perfusion imaging 08/19/2019: Study Highlights    The left ventricular ejection fraction is normal (55-65%). Nuclear stress EF: 55%. There was no ST segment deviation noted during stress. No T wave inversion was noted during stress. The study is normal. This is a low risk study.   1. There are reduced counts present in the basal to mid inferior segments on rest imaging that improve on stress imaging. The wall motion in this region is normal. Overall, this is consistent with diaphragm attenuation. There is no evidence of ischemia or prior infarction. 2. Normal LVEF, 55%.  3. This is a low risk study.   Renal Doppler study performed 01/2021: Summary:  Largest Aortic Diameter: 2.1 cm     Renal:     Right: Normal size right kidney. Normal right Resistive Index.         Normal cortical thickness of right kidney. No evidence of         right renal artery stenosis. 1-59% stenosis of the right         renal artery. RRV flow present.  Left:  Normal size of left kidney. Normal left Resistive Index.         Normal cortical thickness of the left kidney. 1-59% stenosis         of the left renal artery. LRV flow present.  Mesenteric:  Normal Celiac artery and Superior Mesenteric artery findings.     Patent IVC.     *See  table(s) above for measurements and observations.   EKG:  EKG performed on 12/16/2020 demonstrates normal sinus rhythm with normal overall appearance.  Baseline artifact noted.  Today's tracing demonstrates baseline artifact, nonspecific T wave flattening in the inferior leads, no evidence of infarction or acute ST-T wave change.  Recent Labs: 01/19/2021: TSH 1.240 02/11/2021: Hemoglobin 14.4; Platelets 270 02/15/2021: ALT 32; BUN 17; Creatinine, Ser 1.04; Potassium 4.0; Sodium 139  Recent Lipid Panel    Component Value Date/Time   CHOL 127 02/15/2021 0811   TRIG 100 02/15/2021 0811  HDL 47 02/15/2021 0811   CHOLHDL 2.7 02/15/2021 0811   LDLCALC 61 02/15/2021 0811    Physical Exam:    VS:  BP 130/70    Pulse 89    Ht 5\' 6"  (1.676 m)    Wt 187 lb (84.8 kg)    SpO2 95%    BMI 30.18 kg/m     Wt Readings from Last 3 Encounters:  05/31/21 187 lb (84.8 kg)  02/11/21 175 lb (79.4 kg)  12/15/20 178 lb (80.7 kg)     GEN: Overweight. No acute distress HEENT: Normal NECK: No JVD. LYMPHATICS: No lymphadenopathy CARDIAC: No murmur. RRR no gallop, or edema. VASCULAR:  Normal Pulses. No bruits. RESPIRATORY:  Clear to auscultation without rales, wheezing or rhonchi  ABDOMEN: Soft, non-tender, non-distended, No pulsatile mass, MUSCULOSKELETAL: No deformity  SKIN: Warm and dry NEUROLOGIC:  Alert and oriented x 3 PSYCHIATRIC:  Normal affect   ASSESSMENT:    1. Chest pain of uncertain etiology   2. Agatston coronary artery calcium score between 200 and 399   3. Primary hypertension   4. OSA (obstructive sleep apnea)   5. Aortic atherosclerosis ([place])   6. Dyslipidemia   7. Precordial pain    PLAN:    In order of problems listed above:  With ischemic quality.  Start aspirin 81 mg/day.  Sublingual nitroglycerin is given to use if recurrent discomfort that lasts longer than 5 minutes.  A troponin T will be obtained and if elevated, he will need coronary angiography.  If normal we will  proceed with coronary CT with FFR if indicated. Known asymptomatic coronary atherosclerosis based upon calcium score. Adequate blood pressure control. Untreated obstructive sleep apnea. He is on rosuvastatin 40 mg/day. Continue statin   Overall education and awareness concerning primary risk prevention was discussed in detail: LDL less than 70, hemoglobin A1c less than 7, blood pressure target less than 130/80 mmHg, >150 minutes of moderate aerobic activity per week, avoidance of smoking, weight control (via diet and exercise), and continued surveillance/management of/for obstructive sleep apnea.    Medication Adjustments/Labs and Tests Ordered: Current medicines are reviewed at length with the patient today.  Concerns regarding medicines are outlined above.  Orders Placed This Encounter  Procedures   CT CORONARY MORPH W/CTA COR W/SCORE W/CA W/CM &/OR WO/CM   Basic metabolic panel   Troponin T   EKG 12-Lead   Meds ordered this encounter  Medications   nitroGLYCERIN (NITROSTAT) 0.4 MG SL tablet    Sig: Place 1 tablet (0.4 mg total) under the tongue every 5 (five) minutes as needed for chest pain.    Dispense:  25 tablet    Refill:  3   aspirin EC 81 MG tablet    Sig: Take 1 tablet (81 mg total) by mouth daily. Swallow whole.    Dispense:  90 tablet    Refill:  3    Patient Instructions  Medication Instructions:  1) START Aspirin 81mg  once daily 2) A prescription has been sent in for Nitroglycerin.  If you have chest pain that doesn't relieve quickly, place one tablet under your tongue and allow it to dissolve.  If no relief after 5 minutes, you may take another pill.  If no relief after 5 minutes, you may take a 3rd dose but you need to call 911 and report to ER immediately.  *If you need a refill on your cardiac medications before your next appointment, please call your pharmacy*   Lab Work: BMET and  Troponin today  If you have labs (blood work) drawn today and your tests  are completely normal, you will receive your results only by: [facility] (if you have MyChart) OR A paper copy in the mail If you have any lab test that is abnormal or we need to change your treatment, we will call you to review the results.   Testing/Procedures: Your physician recommends that you have a Coronary CT performed.   Follow-Up: At [facility], you and your health needs are our priority.  As part of our continuing mission to provide you with exceptional heart care, we have created designated Provider Care Teams.  These Care Teams include your primary Cardiologist (physician) and Advanced Practice Providers (APPs -  Physician Assistants and Nurse Practitioners) who all work together to provide you with the care you need, when you need it.  We recommend signing up for the patient portal called "MyChart".  Sign up information is provided on this After Visit Summary.  MyChart is used to connect with patients for Virtual Visits (Telemedicine).  Patients are able to view lab/test results, encounter notes, upcoming appointments, etc.  Non-urgent messages can be sent to your provider as well.   To learn more about what you can do with MyChart, go to [email or web address].    Your next appointment:   2-4 week(s) after CT  The format for your next appointment:   In Person  Provider:   Fransico Him, MD     Other Instructions    Your cardiac CT will be scheduled at one of the below locations:   [facility] [address] [place], [place] [address] [phone]  [facility] [address] [place], [place] [address] [phone]  If scheduled at [facility], please arrive at the [facility] and Children's Entrance (Entrance C2) of [facility] 30 minutes prior to test start time. You can use the FREE valet parking offered at entrance C (encouraged to control the heart rate for the test)   Proceed to the [facility] Radiology Department (first floor) to check-in and test prep.  All radiology patients and guests should use entrance C2 at [facility], accessed from [facility], even though the hospital's physical address listed is [address].    If scheduled at [facility] please arrive 15 mins early for check-in and test prep.  Please follow these instructions carefully (unless otherwise directed):  Hold all erectile dysfunction medications at least 3 days (72 hrs) prior to test.  On the Night Before the Test: Be sure to Drink plenty of water. Do not consume any caffeinated/decaffeinated beverages or chocolate 12 hours prior to your test. Do not take any antihistamines 12 hours prior to your test. If the patient has contrast allergy: Patient will need a prescription for Prednisone and very clear instructions (as follows): Prednisone 50 mg - take 13 hours prior to test Take another Prednisone 50 mg 7 hours prior to test Take another Prednisone 50 mg 1 hour prior to test Take Benadryl 50 mg 1 hour prior to test Patient must complete all four doses of above prophylactic medications. Patient will need a ride after test due to Benadryl.  On the Day of the Test: Drink plenty of water until 1 hour prior to the test. Do not eat any food 4 hours prior to the test. You may take your regular medications prior to the test.  Take  metoprolol (Lopressor) two hours prior to test. HOLD Furosemide/Hydrochlorothiazide morning of the test. FEMALES- please wear underwire-free bra if available, avoid dresses & tight clothing   *For Clinical Staff only. Please instruct patient the following:* Heart Rate Medication Recommendations for Cardiac CT  Resting HR < 50 bpm  No medication  Resting HR 50-60 bpm and BP >110/50 mmHG   Consider Metoprolol tartrate 25 mg PO 90-120 min prior to scan  Resting HR 60-65 bpm and BP >110/50 mmHG   Metoprolol tartrate 50 mg PO 90-120 minutes prior to scan   Resting HR > 65 bpm and BP >110/50 mmHG  Metoprolol tartrate 100 mg PO 90-120 minutes prior to scan  Consider Ivabradine 10-15 mg PO or a calcium channel blocker for resting HR >60 bpm and contraindication to metoprolol tartrate  Consider Ivabradine 10-15 mg PO in combination with metoprolol tartrate for HR >80 bpm         After the Test: Drink plenty of water. After receiving IV contrast, you may experience a mild flushed feeling. This is normal. On occasion, you may experience a mild rash up to 24 hours after the test. This is not dangerous. If this occurs, you can take Benadryl 25 mg and increase your fluid intake. If you experience trouble breathing, this can be serious. If it is severe call 911 IMMEDIATELY. If it is mild, please call our office. If you take any of these medications: Glipizide/Metformin, Avandament, Glucavance, please do not take 48 hours after completing test unless otherwise instructed.  We will call to schedule your test 2-4 weeks out understanding that some insurance companies will need an authorization prior to the service being performed.   For non-scheduling related questions, please contact the cardiac imaging nurse navigator should you have any questions/concerns: Marchia Bond, Cardiac Imaging Nurse Navigator Gordy Clement, Cardiac Imaging Nurse Navigator [facility] Direct Office Dial: [phone]   For scheduling needs, including cancellations and rescheduling, please call [place], [phone].     Signed, Sinclair Grooms, MD  05/31/2021 4:23 PM    [place]

## 2021-05-31 NOTE — Telephone Encounter (Signed)
Called patient in regards to his MyChart message. He reports he woke up yesterday morning with chest pain that he describes as a heavy pressure. He states that it lasted for about an hour and resolved on its own. He states that he monitored his vitals and EKG on an Apple watch and everything seemed to be normal. He is going to send over his EKG reading through MyChart. He denies having any associated symptoms at the time. He has not had any further episodes of chest pain. Advised on ER precautions for return of symptoms.  ?

## 2021-05-31 NOTE — Addendum Note (Signed)
Addended by: Loren Racer on: 05/31/2021 04:37 PM ? ? Modules accepted: Orders ? ?

## 2021-06-01 ENCOUNTER — Encounter (HOSPITAL_COMMUNITY): Payer: Self-pay

## 2021-06-01 ENCOUNTER — Telehealth: Payer: Self-pay | Admitting: Interventional Cardiology

## 2021-06-01 NOTE — Telephone Encounter (Signed)
Call [facility] around 1230 to check on status of Troponin level as it was not back yet.  Was advised that the blood was sent to [facility] as the order placed was a [place] lab.  Advised the resulting agent chosen was [facility] and I was told that the lab was not STAT so it was sent to [facility] [place] and spoke with [place] who informed me that at that time, they have not received the specimen at this time to even run it.  Stated courier would have picked it up this morning.  She states order was not called into them as STAT because they send out special couriers to retrieve STATs.  Asked if there is any way to expedite this lab once we received and she states that she can change the lab to priority.   ? ?Called the pt at 2:54pm and he states he is doing fine today.  Has not had any chest pain or discomfort.  Coronary CT is scheduled for 3/20.  Advised we will keep that plan for now and I would call with the lab as soon as it comes in.  Pt appreciative for call.  ?

## 2021-06-02 ENCOUNTER — Telehealth: Payer: Self-pay | Admitting: Interventional Cardiology

## 2021-06-02 LAB — BASIC METABOLIC PANEL
BUN/Creatinine Ratio: 12 (ref 10–24)
BUN: 14 mg/dL (ref 8–27)
CO2: 25 mmol/L (ref 20–29)
Calcium: 9.8 mg/dL (ref 8.6–10.2)
Chloride: 96 mmol/L (ref 96–106)
Creatinine, Ser: 1.13 mg/dL (ref 0.76–1.27)
Glucose: 151 mg/dL — ABNORMAL HIGH (ref 70–99)
Potassium: 3.9 mmol/L (ref 3.5–5.2)
Sodium: 134 mmol/L (ref 134–144)
eGFR: 74 mL/min/{1.73_m2} (ref >59)

## 2021-06-02 LAB — TROPONIN T: Troponin T (Highly Sensitive): 12 ng/L (ref 0–22)

## 2021-06-02 NOTE — Telephone Encounter (Signed)
Spoke with pt and he states he is heading to [place] in the morning because his daughter just had a baby and her husband is having to go out of town and she's needing help because she also has a 61 year old.  Pt states he really has to go because he promised her he would come help.  Asked if he was going alone and he said yes.  Advised pt if he absolutely has to go, take precautions and if CP develops he needs to stop where he's at and seek medical assistance.  Advised if once he gets down there and has chest discomfort, he also needs to seek assistance.  Pt verbalized understanding and was in agreement with plan.  ?

## 2021-06-02 NOTE — Telephone Encounter (Signed)
Patient is requesting to speak with Dr. Thompson Caul nurse ASAP. He states he plans to drive to [place] tomorrow around 8:00 AM and would like to know if Dr. Tamala Julian thinks this is alright. ?

## 2021-06-11 ENCOUNTER — Telehealth (HOSPITAL_COMMUNITY): Payer: Self-pay | Admitting: Emergency Medicine

## 2021-06-11 NOTE — Telephone Encounter (Signed)
Attempted to call patient regarding upcoming cardiac CT appointment. °Left message on voicemail with name and callback number °Meena Barrantes RN Navigator Cardiac Imaging °[facility] °[phone] Office °[phone] Cell ° °

## 2021-06-14 ENCOUNTER — Ambulatory Visit (HOSPITAL_COMMUNITY)
Admission: RE | Admit: 2021-06-14 | Discharge: 2021-06-14 | Disposition: A | Discharge status: Home / Self Care | Payer: BC Managed Care – PPO | Source: Ambulatory Visit | Attending: Interventional Cardiology | Admitting: Interventional Cardiology

## 2021-06-14 ENCOUNTER — Other Ambulatory Visit: Payer: Self-pay

## 2021-06-14 ENCOUNTER — Telehealth (HOSPITAL_COMMUNITY): Payer: Self-pay | Admitting: Emergency Medicine

## 2021-06-14 ENCOUNTER — Telehealth (HOSPITAL_COMMUNITY): Payer: Self-pay | Admitting: *Deleted

## 2021-06-14 DIAGNOSIS — R072 Precordial pain: Secondary | ICD-10-CM | POA: Insufficient documentation

## 2021-06-14 MED ORDER — NITROGLYCERIN 0.4 MG SL SUBL
SUBLINGUAL_TABLET | SUBLINGUAL | Status: AC
  Filled 2021-06-14: qty 2

## 2021-06-14 MED ORDER — NITROGLYCERIN 0.4 MG SL SUBL
0.8000 mg | SUBLINGUAL_TABLET | Freq: Once | SUBLINGUAL | Status: AC
  Administered 2021-06-14: 0.8 mg via SUBLINGUAL

## 2021-06-14 MED ORDER — IOHEXOL 350 MG/ML SOLN
100.0000 mL | Freq: Once | INTRAVENOUS | Status: AC | PRN
  Administered 2021-06-14: 100 mL via INTRAVENOUS

## 2021-06-14 NOTE — Telephone Encounter (Signed)
Attempted to call patient regarding upcoming cardiac CT appointment. °Left message on voicemail with name and callback number °Lemoyne Nestor RN Navigator Cardiac Imaging °[facility] °[phone] Office °[phone] Cell ° °

## 2021-06-14 NOTE — Telephone Encounter (Signed)
Patient returning call regarding upcoming cardiac imaging study; pt verbalizes understanding of appt date/time, parking situation and where to check in, pre-test NPO status and medications ordered, and verified current allergies; name and call back number provided for further questions should they arise ? ?Gordy Clement RN Navigator Cardiac Imaging ?[facility] ?[phone] office ?[phone] cell ? ?Patient to take 50mg  metoprolol tartrate two hours prior to his cardiac CT scan.  He is aware to arrive at 12:30pm for his 1pm scan. ?

## 2021-06-16 ENCOUNTER — Telehealth: Payer: Self-pay

## 2021-06-16 DIAGNOSIS — G4733 Obstructive sleep apnea (adult) (pediatric): Secondary | ICD-10-CM

## 2021-06-16 NOTE — Telephone Encounter (Deleted)
-----   Message from Belva Crome, MD sent at 06/15/2021  9:19 AM EDT ----- ?Let the patient know the coronary CTA demonstrated hardening of the arteries with a calcium score 510.  No evidence of any significant blockage was found.  There is plaque in the arteries nothing greater than 50%.  Greater than 80% is considered significant.  We need to work hard on keeping the cholesterol and all of the risk factors under control.  LDL needs to be less than 70, blood pressure 130/80 or less, he needs greater than 150 minutes of moderate physical activity, he needs to consider reengaging with the sleep doctors to see if there is a way to control sleep apnea to remove that is a risk factor.  I need to see him in approximately 6 months. ?A copy will be sent to [name], MD ?

## 2021-06-16 NOTE — Telephone Encounter (Signed)
The patient has been notified of the result and verbalized understanding.  All questions (if any) were answered. ?Antonieta Iba, RN 06/16/2021 4:03 PM  ? ? ?Patient seen by Dr. Tamala Julian on DOD day for Dr. Radford Pax.  ?When he saw Dr. Radford Pax last fall a home sleep test was ordered but was not completed. Patient has been followed previously by Dr. Maureen Chatters for sleep. He is unable to tolerate CPAP and was not a candidate for [facility].  ?

## 2021-06-16 NOTE — Telephone Encounter (Signed)
-----   Message from Belva Crome, MD sent at 06/15/2021  9:19 AM EDT ----- ?Let the patient know the coronary CTA demonstrated hardening of the arteries with a calcium score 510.  No evidence of any significant blockage was found.  There is plaque in the arteries nothing greater than 50%.  Greater than 80% is considered significant.  We need to work hard on keeping the cholesterol and all of the risk factors under control.  LDL needs to be less than 70, blood pressure 130/80 or less, he needs greater than 150 minutes of moderate physical activity, he needs to consider reengaging with the sleep doctors to see if there is a way to control sleep apnea to remove that is a risk factor.  I need to see him in approximately 6 months. ?A copy will be sent to [name], MD ?

## 2021-06-16 NOTE — Telephone Encounter (Signed)
Results discussed with patient. See telephone call.  ?

## 2021-06-17 NOTE — Telephone Encounter (Signed)
Spoke with the patient and [place] sleep study has been ordered. He is going to come by on Monday at 11am to pick it up.  ?

## 2021-06-21 ENCOUNTER — Telehealth: Payer: Self-pay | Admitting: *Deleted

## 2021-06-21 DIAGNOSIS — G4733 Obstructive sleep apnea (adult) (pediatric): Secondary | ICD-10-CM

## 2021-06-21 DIAGNOSIS — G4719 Other hypersomnia: Secondary | ICD-10-CM

## 2021-06-21 DIAGNOSIS — I10 Essential (primary) hypertension: Secondary | ICD-10-CM

## 2021-06-21 NOTE — Telephone Encounter (Signed)
Pt aware to not ope the box until he is called with the PIN once authorized by his insurance. Set up date 06/21/21 ?

## 2021-06-21 NOTE — Telephone Encounter (Signed)
See phone note today from me.  ?

## 2021-06-21 NOTE — Telephone Encounter (Signed)
Pt here today for Itamar setup. Stop bang done.  ? ?Patient Name:  ?       ?DOB:  ?     ?Height:     ?Weight: ? ?Office Name: ?        ?Referring Provider: ? ?Today's Date: ? ?Date:   ?STOP BANG RISK ASSESSMENT ?S (snore) Have you been told that you snore?     YES ?  ?T (tired) Are you often tired, fatigued, or sleepy during the day?  ? YES  ?O (obstruction) Do you stop breathing, choke, or gasp during sleep? YES ?  ?P (pressure) Do you have or are you being treated for high blood pressure? YES ?  ?B (BMI) Is your body index greater than 35 kg/m? YES ?  ?A (age) Are you 4 years old or older? YES ?  ?N (neck) Do you have a neck circumference greater than 16 inches?  ? YES ?  ?G (gender) Are you a male? YES ?  ?TOTAL STOP/BANG ?YES? ANSWERS 8  ? ?                                                                    For Office Use Only              Procedure Order Form    ?YES to 3+ Stop Bang questions OR two clinical symptoms - patient qualifies for WatchPAT (CPT 95800)     ?       ? ?Clinical Notes: Will consult Sleep Specialist and refer for management of therapy due to patient increased risk of Sleep Apnea. Ordering a sleep study due to the following two clinical symptoms: Excessive daytime sleepiness G47.10 / Gastroesophageal reflux K21.9 / Nocturia R35.1 / Morning Headaches G44.221 / Difficulty concentrating R41.840 / Memory problems or poor judgment G31.84 / Personality changes or irritability R45.4 / Loud snoring R06.83 / Depression F32.9 / Unrefreshed by sleep G47.8 / Impotence N52.9 / History of high blood pressure R03.0 / Insomnia G47.00  ? ? ?I understand that I am proceeding with a home sleep apnea test as ordered by my treating physician. I understand that untreated sleep apnea is a serious cardiovascular risk factor and it is my responsibility to perform the test and seek management for sleep apnea. I will be contacted with the results and be managed for sleep apnea by a local sleep physician. I will be  receiving equipment and further instructions from [facility]. I shall promptly ship back the equipment via the included mailing label. I understand my insurance will be billed for the test and as the patient I am responsible for any insurance related out-of-pocket costs incurred. I have been provided with written instructions and can call for additional video or telephonic instruction, with 24-hour availability of qualified personnel to answer any questions: Patient Help Desk [phone]. ? ?Patient Signature ______________________________________________________   Date______________________ ?Patient Telemedicine Verbal Consent ? ? ? ?

## 2021-06-29 NOTE — Telephone Encounter (Signed)
Order ID: [id number] ?Authorized ?Approval Valid Through:06/29/2021 - 08/27/2021                                                        ? request does not meet review criteria for a Home Sleep Test (HST ?Switch to In-Lab ?

## 2021-06-30 NOTE — Telephone Encounter (Signed)
See notes from Gershon Cull, CMA, Itamar sleep study (HST) has been denied. Per notes insurance approved for IN-LAB sleep study, which Gae Bon will reach out to the pt to set up the in lab study. I left message to please call the office and let me know what day he will drop off the Itamar device, unopened. He can also drop off this week at the front desk, returning this box back to [facility]. I will monitor this until the device has been returned.  ?

## 2021-07-01 NOTE — Telephone Encounter (Addendum)
Pt returned Itamar device yesterday 06/30/21. This has been un-registered. See previous notes for further details. I have placed the device back in stock in the office.  ?

## 2021-07-15 NOTE — Addendum Note (Signed)
Addended by: Freada Bergeron on: 07/15/2021 11:56 AM ? ? Modules accepted: Orders ? ?

## 2021-07-15 NOTE — Telephone Encounter (Signed)
Per dr Radford Pax ok to order in lab study. ?

## 2021-07-21 NOTE — Telephone Encounter (Signed)
Prior Authorization for SPLIT NIGHT sent to [facility] via web portal.  ?Order ID: [id number] ?Authorized ?Approval Valid Through:06/29/2021 - 08/27/2021 ?

## 2021-09-01 NOTE — Telephone Encounter (Addendum)
Prior Authorization for SPLIT NIGHT sent to [employer] via web portal. APPROVED [phone] VALID  10/01/21--10/30/21

## 2021-09-07 ENCOUNTER — Ambulatory Visit (HOSPITAL_BASED_OUTPATIENT_CLINIC_OR_DEPARTMENT_OTHER): Payer: BC Managed Care – PPO | Attending: Cardiology | Admitting: Cardiology

## 2021-09-07 DIAGNOSIS — G4719 Other hypersomnia: Secondary | ICD-10-CM | POA: Insufficient documentation

## 2021-09-07 DIAGNOSIS — G4736 Sleep related hypoventilation in conditions classified elsewhere: Secondary | ICD-10-CM | POA: Diagnosis not present

## 2021-09-07 DIAGNOSIS — G4733 Obstructive sleep apnea (adult) (pediatric): Secondary | ICD-10-CM | POA: Diagnosis not present

## 2021-09-07 DIAGNOSIS — R0683 Snoring: Secondary | ICD-10-CM | POA: Insufficient documentation

## 2021-09-07 DIAGNOSIS — I10 Essential (primary) hypertension: Secondary | ICD-10-CM | POA: Diagnosis not present

## 2021-09-13 NOTE — Procedures (Signed)
   Patient Name: Matthew [name], Yordy Date: 09/07/2021 Gender: Male D.O.B: 1961-01-18 Age (years): 16 Referring Provider: Fransico Him MD, ABSM Height (inches): 64 Interpreting Physician: Fransico Him MD, ABSM Weight (lbs): 180 RPSGT: Laren Everts BMI: 31 MRN: [id number] Neck Size: 17.50  CLINICAL INFORMATION Sleep Study Type: NPSG  Indication for sleep study: Excessive Daytime Sleepiness, Fatigue, Obesity, OSA, Snoring  Epworth Sleepiness Score: 12  SLEEP STUDY TECHNIQUE As per the AASM Manual for the Scoring of Sleep and Associated Events v2.3 (April 2016) with a hypopnea requiring 4% desaturations.  The channels recorded and monitored were frontal, central and occipital EEG, electrooculogram (EOG), submentalis EMG (chin), nasal and oral airflow, thoracic and abdominal wall motion, anterior tibialis EMG, snore microphone, electrocardiogram, and pulse oximetry.  MEDICATIONS Medications self-administered by patient taken the night of the study : METOPROLOL TARTRATE, CLONAZEPAM, AMLODIPINE, Rosuvastatin  SLEEP ARCHITECTURE The study was initiated at 10:52:51 PM and ended at 5:08:28 AM.  Sleep onset time was 32.5 minutes and the sleep efficiency was 76.3%. The total sleep time was 286.5 minutes.  Stage REM latency was 58.5 minutes.  The patient spent 11.3% of the night in stage N1 sleep, 70.2% in stage N2 sleep, 0.0% in stage N3 and 18.5% in REM.  Alpha intrusion was absent.  Supine sleep was 16.06%.  RESPIRATORY PARAMETERS The overall apnea/hypopnea index (AHI) was 12.6 per hour. There were 0 total apneas, including 0 obstructive, 0 central and 0 mixed apneas. There were 60 hypopneas and 18 RERAs.  The AHI during Stage REM sleep was 57.7 per hour.  AHI while supine was 47.0 per hour.  The mean oxygen saturation was 89.9%. The minimum SpO2 during sleep was 69.0%.  loud snoring was noted during this study.  CARDIAC DATA The 2 lead EKG demonstrated sinus rhythm.  The mean heart rate was 68.1 beats per minute. Other EKG findings include: None.  LEG MOVEMENT DATA The total PLMS were 0 with a resulting PLMS index of 0.0. Associated arousal with leg movement index was 2.1  IMPRESSIONS - Mild obstructive sleep apnea occurred during this study (AHI = 12.6/h) overall but severe during REM sleep with AHIr 57.7/hr.  - Severe oxygen desaturation was noted during this study (Min O2 = 69.0%). - The patient snored with loud snoring volume. - No cardiac abnormalities were noted during this study. - Clinically significant periodic limb movements did not occur during sleep. No significant associated arousals.  DIAGNOSIS - Obstructive Sleep Apnea (G47.33) - Nocturnal Hypoxemia (G47.36)  RECOMMENDATIONS - Therapeutic CPAP titration to determine optimal pressure required to alleviate sleep disordered breathing. - Positional therapy avoiding supine position during sleep. - Avoid alcohol, sedatives and other CNS depressants that may worsen sleep apnea and disrupt normal sleep architecture. - Sleep hygiene should be reviewed to assess factors that may improve sleep quality. - Weight management and regular exercise should be initiated or continued if appropriate.  [Electronically signed] 09/13/2021 03:10 PM  Fransico Him MD, ABSM Diplomate, American Board of Sleep Medicine

## 2021-09-14 ENCOUNTER — Encounter: Payer: Self-pay | Admitting: Cardiology

## 2021-09-14 ENCOUNTER — Telehealth: Payer: Self-pay | Admitting: *Deleted

## 2021-09-14 DIAGNOSIS — G4719 Other hypersomnia: Secondary | ICD-10-CM

## 2021-09-14 DIAGNOSIS — I10 Essential (primary) hypertension: Secondary | ICD-10-CM

## 2021-09-14 DIAGNOSIS — G4733 Obstructive sleep apnea (adult) (pediatric): Secondary | ICD-10-CM

## 2021-09-14 NOTE — Telephone Encounter (Signed)
The patient has been notified of the result and verbalized understanding.  All questions (if any) were answered. Marolyn Hammock, CMA 09/14/2021 6:54 PM    Cpap titration to precert

## 2021-09-14 NOTE — Telephone Encounter (Signed)
-----   Message from Lauralee Evener, [place] sent at 09/14/2021  8:31 AM EDT -----  ----- Message ----- From: Sueanne Margarita, MD Sent: 09/13/2021   3:12 PM EDT To: [facility] Sleep Studies  Please let patient know that they have sleep apnea.  Recommend therapeutic CPAP titration for treatment of patient's sleep disordered breathing.  If unable to perform an in lab titration then initiate ResMed auto CPAP from 4 to 15cm H2O with heated humidity and mask of choice and overnight pulse ox on CPAP.

## 2021-11-15 ENCOUNTER — Ambulatory Visit (HOSPITAL_BASED_OUTPATIENT_CLINIC_OR_DEPARTMENT_OTHER): Payer: BC Managed Care – PPO | Admitting: Cardiology

## 2021-11-18 ENCOUNTER — Encounter (HOSPITAL_BASED_OUTPATIENT_CLINIC_OR_DEPARTMENT_OTHER): Payer: BC Managed Care – PPO | Admitting: Cardiology

## 2021-12-29 ENCOUNTER — Encounter (HOSPITAL_BASED_OUTPATIENT_CLINIC_OR_DEPARTMENT_OTHER): Payer: BC Managed Care – PPO | Admitting: Cardiology

## 2021-12-31 ENCOUNTER — Other Ambulatory Visit: Payer: Self-pay

## 2021-12-31 MED ORDER — ROSUVASTATIN CALCIUM 40 MG PO TABS: 40.0000 mg | ORAL_TABLET | Freq: Every day | ORAL | 1 refills | Status: DC

## 2022-02-01 ENCOUNTER — Encounter (HOSPITAL_BASED_OUTPATIENT_CLINIC_OR_DEPARTMENT_OTHER): Payer: Self-pay | Admitting: Cardiology

## 2022-04-06 ENCOUNTER — Other Ambulatory Visit: Payer: Self-pay | Admitting: Cardiology

## 2022-04-12 ENCOUNTER — Other Ambulatory Visit: Payer: Self-pay | Admitting: Cardiology

## 2022-04-12 DIAGNOSIS — I10 Essential (primary) hypertension: Secondary | ICD-10-CM

## 2022-05-16 ENCOUNTER — Emergency Department (HOSPITAL_COMMUNITY): Payer: Commercial Managed Care - HMO

## 2022-05-16 ENCOUNTER — Other Ambulatory Visit: Payer: Self-pay

## 2022-05-16 ENCOUNTER — Observation Stay (HOSPITAL_COMMUNITY)
Admission: EM | Admit: 2022-05-16 | Discharge: 2022-05-18 | Disposition: A | Discharge status: Home / Self Care | Payer: Commercial Managed Care - HMO | Attending: Internal Medicine | Admitting: Internal Medicine

## 2022-05-16 ENCOUNTER — Encounter (HOSPITAL_COMMUNITY): Payer: Self-pay | Admitting: Emergency Medicine

## 2022-05-16 DIAGNOSIS — Z1152 Encounter for screening for COVID-19: Secondary | ICD-10-CM | POA: Diagnosis not present

## 2022-05-16 DIAGNOSIS — R918 Other nonspecific abnormal finding of lung field: Principal | ICD-10-CM | POA: Insufficient documentation

## 2022-05-16 DIAGNOSIS — C787 Secondary malignant neoplasm of liver and intrahepatic bile duct: Secondary | ICD-10-CM

## 2022-05-16 DIAGNOSIS — R7401 Elevation of levels of liver transaminase levels: Secondary | ICD-10-CM | POA: Insufficient documentation

## 2022-05-16 DIAGNOSIS — E871 Hypo-osmolality and hyponatremia: Secondary | ICD-10-CM | POA: Insufficient documentation

## 2022-05-16 DIAGNOSIS — C7951 Secondary malignant neoplasm of bone: Secondary | ICD-10-CM

## 2022-05-16 DIAGNOSIS — I10 Essential (primary) hypertension: Secondary | ICD-10-CM | POA: Diagnosis not present

## 2022-05-16 DIAGNOSIS — R140 Abdominal distension (gaseous): Secondary | ICD-10-CM | POA: Diagnosis not present

## 2022-05-16 DIAGNOSIS — E785 Hyperlipidemia, unspecified: Secondary | ICD-10-CM | POA: Diagnosis present

## 2022-05-16 DIAGNOSIS — R0602 Shortness of breath: Secondary | ICD-10-CM | POA: Diagnosis not present

## 2022-05-16 DIAGNOSIS — Z7982 Long term (current) use of aspirin: Secondary | ICD-10-CM | POA: Diagnosis not present

## 2022-05-16 DIAGNOSIS — Z79899 Other long term (current) drug therapy: Secondary | ICD-10-CM | POA: Diagnosis not present

## 2022-05-16 DIAGNOSIS — C7800 Secondary malignant neoplasm of unspecified lung: Secondary | ICD-10-CM | POA: Insufficient documentation

## 2022-05-16 DIAGNOSIS — R7303 Prediabetes: Secondary | ICD-10-CM | POA: Diagnosis present

## 2022-05-16 DIAGNOSIS — J449 Chronic obstructive pulmonary disease, unspecified: Secondary | ICD-10-CM | POA: Diagnosis not present

## 2022-05-16 DIAGNOSIS — C3490 Malignant neoplasm of unspecified part of unspecified bronchus or lung: Secondary | ICD-10-CM | POA: Diagnosis present

## 2022-05-16 DIAGNOSIS — K831 Obstruction of bile duct: Secondary | ICD-10-CM | POA: Diagnosis present

## 2022-05-16 DIAGNOSIS — E876 Hypokalemia: Secondary | ICD-10-CM | POA: Insufficient documentation

## 2022-05-16 DIAGNOSIS — R180 Malignant ascites: Secondary | ICD-10-CM | POA: Insufficient documentation

## 2022-05-16 DIAGNOSIS — G4733 Obstructive sleep apnea (adult) (pediatric): Secondary | ICD-10-CM | POA: Diagnosis present

## 2022-05-16 LAB — URINALYSIS, ROUTINE W REFLEX MICROSCOPIC
Glucose, UA: 100 mg/dL — AB
Hgb urine dipstick: NEGATIVE
Ketones, ur: NEGATIVE mg/dL
Leukocytes,Ua: NEGATIVE
Nitrite: NEGATIVE
Protein, ur: 30 mg/dL — AB
Specific Gravity, Urine: 1.015 (ref 1.005–1.030)
pH: 7.5 (ref 5.0–8.0)

## 2022-05-16 LAB — COMPREHENSIVE METABOLIC PANEL
ALT: 310 U/L — ABNORMAL HIGH (ref 0–44)
AST: 138 U/L — ABNORMAL HIGH (ref 15–41)
Albumin: 3.3 g/dL — ABNORMAL LOW (ref 3.5–5.0)
Alkaline Phosphatase: 406 U/L — ABNORMAL HIGH (ref 38–126)
Anion gap: 12 (ref 5–15)
BUN: 11 mg/dL (ref 8–23)
CO2: 25 mmol/L (ref 22–32)
Calcium: 8 mg/dL — ABNORMAL LOW (ref 8.9–10.3)
Chloride: 90 mmol/L — ABNORMAL LOW (ref 98–111)
Creatinine, Ser: 0.71 mg/dL (ref 0.61–1.24)
GFR, Estimated: >60 mL/min (ref >60)
Glucose, Bld: 141 mg/dL — ABNORMAL HIGH (ref 70–99)
Potassium: 4 mmol/L (ref 3.5–5.1)
Sodium: 127 mmol/L — ABNORMAL LOW (ref 135–145)
Total Bilirubin: 4.9 mg/dL — ABNORMAL HIGH (ref 0.3–1.2)
Total Protein: 5.9 g/dL — ABNORMAL LOW (ref 6.5–8.1)

## 2022-05-16 LAB — OSMOLALITY, URINE: Osmolality, Ur: 738 mOsm/kg (ref 300–900)

## 2022-05-16 LAB — CBC WITH DIFFERENTIAL/PLATELET
Abs Immature Granulocytes: 0 10*3/uL (ref 0.00–0.07)
Basophils Absolute: 0 10*3/uL (ref 0.0–0.1)
Basophils Relative: 0 %
Eosinophils Absolute: 0 10*3/uL (ref 0.0–0.5)
Eosinophils Relative: 0 %
HCT: 39.1 % (ref 39.0–52.0)
Hemoglobin: 13.5 g/dL (ref 13.0–17.0)
Lymphocytes Relative: 5 %
Lymphs Abs: 0.8 10*3/uL (ref 0.7–4.0)
MCH: 31 pg (ref 26.0–34.0)
MCHC: 34.5 g/dL (ref 30.0–36.0)
MCV: 89.9 fL (ref 80.0–100.0)
Monocytes Absolute: 1.6 10*3/uL — ABNORMAL HIGH (ref 0.1–1.0)
Monocytes Relative: 10 %
Neutro Abs: 13.3 10*3/uL — ABNORMAL HIGH (ref 1.7–7.7)
Neutrophils Relative %: 85 %
Platelets: 280 10*3/uL (ref 150–400)
RBC: 4.35 MIL/uL (ref 4.22–5.81)
RDW: 14.3 % (ref 11.5–15.5)
WBC: 15.6 10*3/uL — ABNORMAL HIGH (ref 4.0–10.5)
nRBC: 0 % (ref 0.0–0.2)
nRBC: 0 /100 WBC

## 2022-05-16 LAB — URINALYSIS, MICROSCOPIC (REFLEX): Bacteria, UA: NONE SEEN

## 2022-05-16 LAB — SARS CORONAVIRUS 2 BY RT PCR: SARS Coronavirus 2 by RT PCR: NEGATIVE

## 2022-05-16 LAB — PROTIME-INR
INR: 1 (ref 0.8–1.2)
Prothrombin Time: 13 seconds (ref 11.4–15.2)

## 2022-05-16 LAB — LIPASE, BLOOD: Lipase: 62 U/L — ABNORMAL HIGH (ref 11–51)

## 2022-05-16 LAB — CORTISOL: Cortisol, Plasma: 44.4 ug/dL

## 2022-05-16 LAB — TROPONIN I (HIGH SENSITIVITY)
Troponin I (High Sensitivity): 15 ng/L (ref <18)
Troponin I (High Sensitivity): 11 ng/L (ref <18)

## 2022-05-16 LAB — SODIUM, URINE, RANDOM: Sodium, Ur: 107 mmol/L

## 2022-05-16 LAB — LACTIC ACID, PLASMA: Lactic Acid, Venous: 1.5 mmol/L (ref 0.5–1.9)

## 2022-05-16 LAB — BRAIN NATRIURETIC PEPTIDE: B Natriuretic Peptide: 319.6 pg/mL — ABNORMAL HIGH (ref 0.0–100.0)

## 2022-05-16 LAB — GLUCOSE, CAPILLARY: Glucose-Capillary: 158 mg/dL — ABNORMAL HIGH (ref 70–99)

## 2022-05-16 LAB — TSH: TSH: 0.455 u[IU]/mL (ref 0.350–4.500)

## 2022-05-16 MED ORDER — ONDANSETRON HCL 4 MG PO TABS: 4.0000 mg | ORAL_TABLET | Freq: Four times a day (QID) | ORAL | Status: DC | PRN

## 2022-05-16 MED ORDER — IOHEXOL 350 MG/ML SOLN
75.0000 mL | Freq: Once | INTRAVENOUS | Status: AC | PRN
  Administered 2022-05-16: 75 mL via INTRAVENOUS

## 2022-05-16 MED ORDER — PANTOPRAZOLE SODIUM 40 MG PO TBEC
40.0000 mg | DELAYED_RELEASE_TABLET | Freq: Every day | ORAL | Status: DC
  Administered 2022-05-16 – 2022-05-18 (×3): 40 mg via ORAL
  Filled 2022-05-16 (×3): qty 1

## 2022-05-16 MED ORDER — ALBUTEROL SULFATE HFA 108 (90 BASE) MCG/ACT IN AERS
2.0000 | INHALATION_SPRAY | RESPIRATORY_TRACT | Status: DC | PRN
  Administered 2022-05-16: 2 via RESPIRATORY_TRACT
  Filled 2022-05-16: qty 6.7

## 2022-05-16 MED ORDER — FUROSEMIDE 10 MG/ML IJ SOLN
20.0000 mg | Freq: Two times a day (BID) | INTRAMUSCULAR | Status: DC
  Administered 2022-05-16 – 2022-05-18 (×3): 20 mg via INTRAVENOUS
  Filled 2022-05-16 (×3): qty 2

## 2022-05-16 MED ORDER — METOPROLOL TARTRATE 50 MG PO TABS
50.0000 mg | ORAL_TABLET | Freq: Two times a day (BID) | ORAL | Status: DC
  Administered 2022-05-16 – 2022-05-18 (×4): 50 mg via ORAL
  Filled 2022-05-16: qty 1
  Filled 2022-05-16: qty 2
  Filled 2022-05-16 (×2): qty 1

## 2022-05-16 MED ORDER — ACETAMINOPHEN 325 MG PO TABS: 650.0000 mg | ORAL_TABLET | Freq: Four times a day (QID) | ORAL | Status: DC | PRN

## 2022-05-16 MED ORDER — FUROSEMIDE 10 MG/ML IJ SOLN
20.0000 mg | Freq: Once | INTRAMUSCULAR | Status: AC
  Administered 2022-05-16: 20 mg via INTRAVENOUS
  Filled 2022-05-16: qty 2

## 2022-05-16 MED ORDER — AMLODIPINE BESYLATE 10 MG PO TABS
10.0000 mg | ORAL_TABLET | Freq: Every day | ORAL | Status: DC
  Administered 2022-05-16 – 2022-05-18 (×3): 10 mg via ORAL
  Filled 2022-05-16 (×2): qty 1
  Filled 2022-05-16: qty 2

## 2022-05-16 MED ORDER — AEROCHAMBER PLUS FLO-VU LARGE MISC
Status: AC
  Administered 2022-05-16: 1
  Filled 2022-05-16: qty 1

## 2022-05-16 MED ORDER — ONDANSETRON HCL 4 MG/2ML IJ SOLN: 4.0000 mg | Freq: Four times a day (QID) | INTRAMUSCULAR | Status: DC | PRN

## 2022-05-16 MED ORDER — MELATONIN 5 MG PO TABS
5.0000 mg | ORAL_TABLET | Freq: Every evening | ORAL | Status: AC | PRN
  Administered 2022-05-16 – 2022-05-17 (×2): 5 mg via ORAL
  Filled 2022-05-16 (×2): qty 1

## 2022-05-16 MED ORDER — ACETAMINOPHEN 650 MG RE SUPP: 650.0000 mg | Freq: Four times a day (QID) | RECTAL | Status: DC | PRN

## 2022-05-16 NOTE — ED Provider Notes (Addendum)
[facility] Provider Note   CSN: [id number] Arrival date & time: 05/16/22  0450     History  Chief Complaint  Patient presents with   Shortness of Breath    Matthew [name] is a 62 y.o. male.  Patient presents to the emerged department with multiple complaints.  Patient reports that he has been short of breath for about a week.  He reports that it has progressively worsened.  Patient complains of increased shortness of breath when he walks as well as when he lies flat.  He feels "full of fluid".  He feels like he has gained 10 pounds recently.  Patient reports abdominal distention without pain.  No nausea or vomiting.  He has been having bowel movements, they have been loose.       Home Medications Prior to Admission medications   Medication Sig Start Date End Date Taking? Authorizing Provider  amLODipine (NORVASC) 10 MG tablet TAKE 1 TABLET BY MOUTH DAILY 04/12/22   Sueanne Margarita, MD  aspirin EC 81 MG tablet Take 1 tablet (81 mg total) by mouth daily. Swallow whole. 05/31/21   Belva Crome, MD  clonazePAM Bobbye [place]) 0.5 MG tablet TAKE 1/2 TABLET BY MOUTH AT BEDTIME 03/16/21   Dohmeier, Asencion Partridge, MD  famotidine-calcium carbonate-magnesium hydroxide (PEPCID COMPLETE) [phone] MG chewable tablet Chew 1 tablet by mouth daily as needed.    [provider]  hydrochlorothiazide (MICROZIDE) 12.5 MG capsule Take 1 capsule (12.5 mg total) by mouth daily. 02/09/21   Sueanne Margarita, MD  metoprolol succinate (TOPROL-XL) 100 MG 24 hr tablet Take 1 tablet (100 mg total) by mouth daily. 04/06/22   Sueanne Margarita, MD  metoprolol tartrate (LOPRESSOR) 50 MG tablet Take one tablet by mouth 2 hours prior to CT 05/31/21   Belva Crome, MD  nitroGLYCERIN (NITROSTAT) 0.4 MG SL tablet Place 1 tablet (0.4 mg total) under the tongue every 5 (five) minutes as needed for chest pain. 05/31/21 08/29/21  Belva Crome, MD  rosuvastatin (CRESTOR) 40 MG tablet  Take 1 tablet (40 mg total) by mouth daily. 12/31/21   Sueanne Margarita, MD      Allergies    Patient has no known allergies.    Review of Systems   Review of Systems  Physical Exam Updated Vital Signs BP (!) 179/87   Pulse 69   Temp 97.6 F (36.4 C)   Resp 18   SpO2 97%  Physical Exam Vitals and nursing note reviewed.  Constitutional:      General: He is not in acute distress.    Appearance: He is well-developed.  HENT:     Head: Normocephalic and atraumatic.     Mouth/Throat:     Mouth: Mucous membranes are moist.  Eyes:     General: Vision grossly intact. Gaze aligned appropriately.     Extraocular Movements: Extraocular movements intact.     Conjunctiva/sclera: Conjunctivae normal.  Cardiovascular:     Rate and Rhythm: Normal rate and regular rhythm.     Pulses: Normal pulses.     Heart sounds: Normal heart sounds, S1 normal and S2 normal. No murmur heard.    No friction rub. No gallop.  Pulmonary:     Effort: Pulmonary effort is normal. No respiratory distress.     Breath sounds: Normal breath sounds.  Abdominal:     General: Abdomen is protuberant.     Palpations: Abdomen is soft.     Tenderness:  There is no abdominal tenderness. There is no guarding or rebound.     Hernia: No hernia is present.  Musculoskeletal:        General: No swelling.     Cervical back: Full passive range of motion without pain, normal range of motion and neck supple. No pain with movement, spinous process tenderness or muscular tenderness. Normal range of motion.     Right lower leg: Pitting Edema (Trace) present.     Left lower leg: Pitting Edema (Trace) present.  Skin:    General: Skin is warm and dry.     Capillary Refill: Capillary refill takes less than 2 seconds.     Findings: No ecchymosis, erythema, lesion or wound.  Neurological:     Mental Status: He is alert and oriented to person, place, and time.     GCS: GCS eye subscore is 4. GCS verbal subscore is 5. GCS motor subscore  is 6.     Cranial Nerves: Cranial nerves 2-12 are intact.     Sensory: Sensation is intact.     Motor: Motor function is intact. No weakness or abnormal muscle tone.     Coordination: Coordination is intact.  Psychiatric:        Mood and Affect: Mood normal.        Speech: Speech normal.        Behavior: Behavior normal.     ED Results / Procedures / Treatments   Labs (all labs ordered are listed, but only abnormal results are displayed) Labs Reviewed  CBC WITH DIFFERENTIAL/PLATELET - Abnormal; Notable for the following components:      Result Value   WBC 15.6 (*)    All other components within normal limits  COMPREHENSIVE METABOLIC PANEL - Abnormal; Notable for the following components:   Sodium 127 (*)    Chloride 90 (*)    Glucose, Bld 141 (*)    Calcium 8.0 (*)    Total Protein 5.9 (*)    Albumin 3.3 (*)    AST 138 (*)    ALT 310 (*)    Alkaline Phosphatase 406 (*)    Total Bilirubin 4.9 (*)    All other components within normal limits  LIPASE, BLOOD - Abnormal; Notable for the following components:   Lipase 62 (*)    All other components within normal limits  LACTIC ACID, PLASMA  BRAIN NATRIURETIC PEPTIDE  URINALYSIS, ROUTINE W REFLEX MICROSCOPIC  TROPONIN I (HIGH SENSITIVITY)    EKG EKG Interpretation  Date/Time:  Monday May 16 2022 04:54:51 EST Ventricular Rate:  74 PR Interval:  122 QRS Duration: 82 QT Interval:  406 QTC Calculation: 450 R Axis:   81 Text Interpretation: Normal sinus rhythm Normal ECG Confirmed by Orpah Greek [phone]) on 05/16/2022 5:14:45 AM  Radiology DG Chest 2 View  Result Date: 05/16/2022 CLINICAL DATA:  62 year old male with history of shortness of breath. EXAM: CHEST - 2 VIEW COMPARISON:  Chest x-ray 03/30/2017. FINDINGS: Lung volumes are normal. No consolidative airspace disease. No pleural effusions. No pneumothorax. No pulmonary nodule or mass noted. Pulmonary vasculature and the cardiomediastinal silhouette are  within normal limits. IMPRESSION: No radiographic evidence of acute cardiopulmonary disease. Electronically Signed   By: Vinnie Langton M.D.   On: 05/16/2022 05:48    Procedures Procedures    Medications Ordered in ED Medications  albuterol (VENTOLIN HFA) 108 (90 Base) MCG/ACT inhaler 2 puff (2 puffs Inhalation Given 05/16/22 0517)  AeroChamber Plus Flo-Vu Large MISC (1 each  Given 05/16/22 0517)    ED Course/ Medical Decision Making/ A&P                             Medical Decision Making Amount and/or Complexity of Data Reviewed External Data Reviewed: labs, radiology, ECG and notes.    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 Decision-making details documented in ED Course. Radiology: ordered and independent interpretation performed. Decision-making details documented in ED Course. ECG/medicine tests: ordered and independent interpretation performed. Decision-making details documented in ED Course.  Risk Prescription drug management.   Differential diagnosis: ACS; CHF; pneumonia; COPD Exacerbation; cholecystitis; colitis; pancreatitis; SBO  Presents with shortness of breath, dark urine, abdominal distention.  Patient had chest x-ray performed that did not show any obvious pathology.  Lab work with elevated LFTs including bilirubin which likely explains his dark urine.  Shortness of breath may be secondary to the element of abdominal distention pushing up on his thoracic cavity.  Will perform CT angiography to rule out PE and scan down through the abdomen and pelvis to further evaluate for pathology.  Patient will be signed out to oncoming ER physician to follow-up results.        Final Clinical Impression(s) / ED Diagnoses Final diagnoses:  Abdominal distension  Shortness of breath    Rx / DC Orders ED Discharge Orders     None         Jackson Coffield, Gwenyth Allegra, MD 05/16/22 9924     Orpah Greek, MD 05/16/22 [phone]

## 2022-05-16 NOTE — H&P (Signed)
History and Physical    Patient: Matthew [name] DOB: [birth year] DOA: 05/16/2022 DOS: the patient was seen and examined on 05/16/2022 PCP: [name], MD  Patient coming from: Home  Chief Complaint:  Chief Complaint  Patient presents with   Shortness of Breath   HPI: Matthew [name] is a 62 y.o. male with medical history significant of bronchitis, maxillary sinusitis, right otitis media, left otitis media, pharyngitis, renal artery stenosis, history of pneumonia, COPD, prediabetes, obstructive sleep apnea not on CPAP, hypertension, history of respiratory failure with hypoxia, diarrhea, diverticulosis, diverticulitis who presented to the emergency department with complaints of progressively worsening dyspnea for the past week associated with a weight gain of about 10 pounds, ascites and postural dizziness.  He mentioned the symptoms to his daughter who is an ICU nurse in [place] who recommended for him to come to the emergency department. He denied fever, chills, rhinorrhea, sore throat, wheezing or hemoptysis.  No chest pain, palpitations, diaphoresis, PND, but has had some orthopnea and lower extremity edema.  No emesis, diarrhea, constipation, melena or hematochezia.  No flank pain, dysuria, frequency or hematuria.  No polyuria, polydipsia, polyphagia or blurred vision.   ED course: Initial vital signs were temperature 97.6 F, pulse 74, respiration 20, BP 166/94 mmHg O2 sat 95% on room air.  He received furosemide 20 mg IVP.  I offered him therapeutic paracentesis with IR, but he declined.  He stated that he would prefer to continue furosemide at the moment.  Lab work: Urinalysis showed glucosuria 100 mg/deciliter, moderate bilirubinuria and proteinuria 30 mg/dL.  CBC showed a white count of 15.6, along 13.5 g/dL platelets 280.  CMP showed a sodium 127 and chloride 90 mmol/L.  Renal function and the rest of the electrolytes were normal after calcium correction.  Glucose  141 and total bilirubin 4.9 mg/dL.  Total protein is 5.9 and albumin 3.3 g/dL.  AST 138, ALT 310 and alkaline phosphatase 406 units/L.  Troponin x 2 normal.  BNP 319.6 pg/mL.  Lipase is 62 units/L.  Normal urine osmolality, urine sodium, TSH, cortisol and coronavirus PCR.  Imaging: Two-view portable chest radiograph with no radiographic evidence of acute cardiopulmonary disease.  CTA chest with right lower lobe mass with subcarinal and ipsilateral mediastinal lymphadenopathy.  There were 2 ill-defined lesions within the cirrhotic liver which could be primary or metastatic.  Negative for PE.  CT abdomen/pelvis with contrast showing hepatomegaly with diffusely at the origin appearance of the hepatic parenchyma.  There are two discrete lesions in the right hepatic lobe.  Questionable hypodense lesion concerning for metastatic disease in the spleen.  There is an L3 lesion that is worrisome osseous metastasis.  Positive diverticulosis with persistent low segment wall thickening of the sigmoid colon.  No evidence of surrounding inflammatory fat stranding.  There is aortic atherosclerosis.   Review of Systems: As mentioned in the history of present illness. All other systems reviewed and are negative.  Past Medical History:  Diagnosis Date   Acute bronchitis 03/30/2017   Acute maxillary sinusitis 07/25/2017   Acute otitis media, right 05/05/2016   Acute respiratory failure with hypoxia ([place]) 03/30/2017   CAP (community acquired pneumonia) 03/31/2017   COPD (chronic obstructive pulmonary disease) ([place])    Diarrhea 10/16/2017   Diverticulitis    Ear build-up, left 08/10/2017   Hypertension    Left otitis media 07/17/2017   Pharyngitis 05/05/2016   Renal artery stenosis ([facility]CC)    1 to 59% bilateral by Dopplers 12/2020  Sigmoid diverticulitis 02/11/2018   Past Surgical History:  Procedure Laterality Date   HERNIA REPAIR     HERNIA REPAIR  1966   Social History:  reports that he quit smoking about 5  years ago. His smoking use included cigarettes. He smoked an average of 1 pack per day. He has never used smokeless tobacco. He reports that he does not currently use drugs after having used the following drugs: Marijuana. Frequency: 2.00 times per week. No history on file for alcohol use.  No Known Allergies  Family History  Problem Relation Age of Onset   Lymphoma Father    Cancer Mother        breast   Colon cancer Neg Hx    Rectal cancer Neg Hx    Stomach cancer Neg Hx     Prior to Admission medications   Medication Sig Start Date End Date Taking? Authorizing Provider  amLODipine (NORVASC) 10 MG tablet TAKE 1 TABLET BY MOUTH DAILY 04/12/22   Sueanne Margarita, MD  aspirin EC 81 MG tablet Take 1 tablet (81 mg total) by mouth daily. Swallow whole. 05/31/21   Belva Crome, MD  clonazePAM [place]bye [place]) 0.5 MG tablet TAKE 1/2 TABLET BY MOUTH AT BEDTIME 03/16/21   Dohmeier, Asencion Partridge, MD  famotidine-calcium carbonate-magnesium hydroxide (PEPCID COMPLETE) 10-800-165 MG chewable tablet Chew 1 tablet by mouth daily as needed.    [provider]  hydrochlorothiazide (MICROZIDE) 12.5 MG capsule Take 1 capsule (12.5 mg total) by mouth daily. 02/09/21   Sueanne Margarita, MD  metoprolol succinate (TOPROL-XL) 100 MG 24 hr tablet Take 1 tablet (100 mg total) by mouth daily. 04/06/22   Sueanne Margarita, MD  metoprolol tartrate (LOPRESSOR) 50 MG tablet Take one tablet by mouth 2 hours prior to CT 05/31/21   Belva Crome, MD  nitroGLYCERIN (NITROSTAT) 0.4 MG SL tablet Place 1 tablet (0.4 mg total) under the tongue every 5 (five) minutes as needed for chest pain. 05/31/21 08/29/21  Belva Crome, MD  rosuvastatin (CRESTOR) 40 MG tablet Take 1 tablet (40 mg total) by mouth daily. 12/31/21   Sueanne Margarita, MD    Physical Exam: Vitals:   05/16/22 0513 05/16/22 0515 05/16/22 0615 05/16/22 0630  BP: (!) 179/87 (!) 179/87 (!) 167/83 (!) 165/92  Pulse: 73 69 64 74  Resp: 18 18 14 15   Temp: 97.6 F (36.4 C)      TempSrc:      SpO2: 96% 97% 91% 94%   Physical Exam Vitals and nursing note reviewed.  Constitutional:      General: He is awake. He is not in acute distress.    Appearance: He is well-developed.  HENT:     Head: Normocephalic.     Nose: No rhinorrhea.     Mouth/Throat:     Pharynx: No oropharyngeal exudate.  Eyes:     General: Scleral icterus present.     Pupils: Pupils are equal, round, and reactive to light.  Neck:     Vascular: No JVD.  Cardiovascular:     Rate and Rhythm: Normal rate and regular rhythm.     Heart sounds: S1 normal and S2 normal.  Pulmonary:     Breath sounds: No wheezing, rhonchi or rales.  Abdominal:     General: Bowel sounds are normal. There is no distension.     Palpations: Abdomen is soft.     Tenderness: There is abdominal tenderness. There is no right CVA tenderness, left CVA tenderness or  guarding.  Musculoskeletal:     Cervical back: Neck supple.     Right lower leg: Edema present.     Left lower leg: Edema present.  Skin:    General: Skin is warm and dry.  Neurological:     General: No focal deficit present.     Mental Status: He is alert and oriented to person, place, and time.  Psychiatric:        Mood and Affect: Mood normal.        Behavior: Behavior normal. Behavior is cooperative.   Data Reviewed:  Results are pending, will review when available.  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 For bronchoscopy in AM. Supplemental oxygen as needed. Continue furosemide 20 mg IVP twice daily for ascites. PCCM consult appreciated. [facility] consult requested  Active Problems:   Ascites, malignant  Continue furosemide for now.    Hypertension Continue metoprolol 50 mg p.o. twice daily. Continue amlodipine 10 mg p.o. daily. On furosemide 20 mg  p.o. twice daily. Monitor BP, HR, renal function electrolytes.    OSA (obstructive sleep apnea) Unable to tolerate CPAP mask.    Hyperlipidemia Hold rosuvastatin given abnormal LFTs.    Prediabetes Check hemoglobin A1c.    Advance Care Planning:   Code Status: Full Code   Consults:  PCCM Ina Homes, MD).  Family Communication:   Severity of Illness: The appropriate patient status for this patient is OBSERVATION. Observation status is judged to be reasonable and necessary in order to provide the required intensity of service to ensure the patient's safety. The patient's presenting symptoms, physical exam findings, and initial radiographic and laboratory data in the context of their medical condition is felt to place them at decreased risk for further clinical deterioration. Furthermore, it is anticipated that the patient will be medically stable for discharge from the hospital within 2 midnights of admission.   Author: Reubin Milan, MD 05/16/2022 9:34 AM  For on call review [email or web address].   This document was prepared using Dragon voice recognition software and may contain some unintended transcription errors.

## 2022-05-16 NOTE — ED Notes (Signed)
ED TO INPATIENT HANDOFF REPORT  ED Nurse Name and Phone #: [phone]  S Name/Age/Gender Matthew [name] 62 y.o. male Room/Bed: 036C/036C  Code Status   Code Status: Full Code  Home/SNF/Other Home Patient oriented to: self, place, time, and situation Is this baseline? Yes   Triage Complete: Triage complete  Chief Complaint Right lower lobe lung mass [R91.8]  Triage Note Pt reports he has increased SOB X1 week. Pt states that he has several issues that "are concerning" including dark urine, bloated stomach, has gained about 10lb and some dizziness that comes and goes in the past week and "just feeling very uncomfortable.     Allergies No Known Allergies  Level of Care/Admitting Diagnosis ED Disposition     ED Disposition  Admit   Condition  --   Comment  Hospital Area: [facility] [[id number]]  Level of Care: Telemetry Medical [104]  May place patient in observation at [facility] or [facility] if equivalent level of care is available:: No  Covid Evaluation: Asymptomatic - no recent exposure (last 10 days) testing not required  Diagnosis: Right lower lobe lung mass [263785]  Admitting Physician: Reubin Milan [[id number]]  Attending Physician: Reubin Milan [[id number]]          B Medical/Surgery History Past Medical History:  Diagnosis Date   Acute bronchitis 03/30/2017   Acute maxillary sinusitis 07/25/2017   Acute otitis media, right 05/05/2016   Acute respiratory failure with hypoxia ([place]) 03/30/2017   CAP (community acquired pneumonia) 03/31/2017   COPD (chronic obstructive pulmonary disease) ([place])    Diarrhea 10/16/2017   Diverticulitis    Ear build-up, left 08/10/2017   Hypertension    Left otitis media 07/17/2017   Pharyngitis 05/05/2016   Renal artery stenosis ([place])    1 to 59% bilateral by Dopplers 12/2020   Sigmoid diverticulitis 02/11/2018   Past Surgical History:  Procedure Laterality Date   [place]     A IV Location/Drains/Wounds Patient Lines/Drains/Airways Status     Active Line/Drains/Airways     Name Placement date Placement time Site Days   Peripheral IV 05/16/22 20 G Anterior;Distal;Left;Upper Arm 05/16/22  0520  Arm  less than 1            Intake/Output Last 24 hours  Intake/Output Summary (Last 24 hours) at 05/16/2022 1835 Last data filed at 05/16/2022 1052 Gross per 24 hour  Intake 240 ml  Output --  Net 240 ml    Labs/Imaging Results for orders placed or performed during the hospital encounter of 05/16/22 (from the past 48 hour(s))  CBC with Differential/Platelet     Status: Abnormal   Collection Time: 05/16/22  5:17 AM  Result Value Ref Range   WBC 15.6 (H) 4.0 - 10.5 K/uL   RBC 4.35 4.22 - 5.81 MIL/uL   Hemoglobin 13.5 13.0 - 17.0 g/dL   HCT 39.1 39.0 - 52.0 %   MCV 89.9 80.0 - 100.0 fL   MCH 31.0 26.0 - 34.0 pg   MCHC 34.5 30.0 - 36.0 g/dL   RDW 14.3 11.5 - 15.5 %   Platelets 280 150 - 400 K/uL   nRBC 0.0 0.0 - 0.2 %   Neutrophils Relative % 85 %   Neutro Abs 13.3 (H) 1.7 - 7.7 K/uL   Lymphocytes Relative 5 %   Lymphs Abs 0.8 0.7 - 4.0 K/uL   Monocytes Relative 10 %   Monocytes Absolute  1.6 (H) 0.1 - 1.0 K/uL   Eosinophils Relative 0 %   Eosinophils Absolute 0.0 0.0 - 0.5 K/uL   Basophils Relative 0 %   Basophils Absolute 0.0 0.0 - 0.1 K/uL   nRBC 0 0 /100 WBC   Abs Immature Granulocytes 0.00 0.00 - 0.07 K/uL    Comment: Performed at [facility], [place] [address] [place], [place] [address]  Comprehensive metabolic panel     Status: Abnormal   Collection Time: 05/16/22  5:17 AM  Result Value Ref Range   Sodium 127 (L) 135 - 145 mmol/L   Potassium 4.0 3.5 - 5.1 mmol/L   Chloride 90 (L) 98 - 111 mmol/L   CO2 25 22 - 32 mmol/L   Glucose, Bld 141 (H) 70 - 99 mg/dL    Comment: Glucose reference range applies only to samples taken after fasting for at least 8 hours.   BUN 11 8 - 23 mg/dL   Creatinine, Ser 0.71 0.61  - 1.24 mg/dL   Calcium 8.0 (L) 8.9 - 10.3 mg/dL   Total Protein 5.9 (L) 6.5 - 8.1 g/dL   Albumin 3.3 (L) 3.5 - 5.0 g/dL   AST 138 (H) 15 - 41 U/L   ALT 310 (H) 0 - 44 U/L   Alkaline Phosphatase 406 (H) 38 - 126 U/L   Total Bilirubin 4.9 (H) 0.3 - 1.2 mg/dL   GFR, Estimated >60 >60 mL/min    Comment: (NOTE) Calculated using the CKD-EPI Creatinine Equation (2021)    Anion gap 12 5 - 15    Comment: Performed at [facility] [address], [place], [place] [address]  Lipase, blood     Status: Abnormal   Collection Time: 05/16/22  5:17 AM  Result Value Ref Range   Lipase 62 (H) 11 - 51 U/L    Comment: Performed at [facility] [address], [place], [place] [address]  Troponin I (High Sensitivity)     Status: None   Collection Time: 05/16/22  5:17 AM  Result Value Ref Range   Troponin I (High Sensitivity) 15 <18 ng/L    Comment: (NOTE) Elevated high sensitivity troponin I (hsTnI) values and significant  changes across serial measurements may suggest ACS but many other  chronic and acute conditions are known to elevate hsTnI results.  Refer to the "Links" section for chest pain algorithms and additional  guidance. Performed at [facility], [place] [address], [place], [place] [address]   Brain natriuretic peptide     Status: Abnormal   Collection Time: 05/16/22  5:17 AM  Result Value Ref Range   B Natriuretic Peptide 319.6 (H) 0.0 - 100.0 pg/mL    Comment: Performed at [facility] [address] [place], [place] [address]  Protime-INR     Status: None   Collection Time: 05/16/22  5:17 AM  Result Value Ref Range   Prothrombin Time 13.0 11.4 - 15.2 seconds   INR 1.0 0.8 - 1.2    Comment: (NOTE) INR goal varies based on device and disease states. Performed at [facility], [place] [address], [place], [place] [address]   TSH     Status: None   Collection Time: 05/16/22  5:17 AM  Result Value Ref Range   TSH 0.455 0.350 - 4.500 uIU/mL     Comment: Performed by a 3rd Generation assay with a functional sensitivity of <=0.01 uIU/mL. Performed at [facility], [address], [place], [place] [address]   Cortisol     Status: None   Collection Time: 05/16/22  5:17 AM  Result Value Ref Range   Cortisol, Plasma 44.4 ug/dL    Comment: (NOTE) AM    6.7 - 22.6 ug/dL PM   <10.0       ug/dL Performed at [facility] [address], [place], [place] [address]   Lactic acid, plasma     Status: None   Collection Time: 05/16/22  5:35 AM  Result Value Ref Range   Lactic Acid, Venous 1.5 0.5 - 1.9 mmol/L    Comment: Performed at [facility] [address], [place], [place] [address]  Urinalysis, Routine w reflex microscopic -Urine, Clean Catch     Status: Abnormal   Collection Time: 05/16/22  6:11 AM  Result Value Ref Range   Color, Urine AMBER (A) YELLOW    Comment: BIOCHEMICALS MAY BE AFFECTED BY COLOR   APPearance CLEAR CLEAR   Specific Gravity, Urine 1.015 1.005 - 1.030   pH 7.5 5.0 - 8.0   Glucose, UA 100 (A) NEGATIVE mg/dL   Hgb urine dipstick NEGATIVE NEGATIVE   Bilirubin Urine MODERATE (A) NEGATIVE   Ketones, ur NEGATIVE NEGATIVE mg/dL   Protein, ur 30 (A) NEGATIVE mg/dL   Nitrite NEGATIVE NEGATIVE   Leukocytes,Ua NEGATIVE NEGATIVE    Comment: Performed at [facility] [address] [place], [place] [address]  Urinalysis, Microscopic (reflex)     Status: None   Collection Time: 05/16/22  6:11 AM  Result Value Ref Range   RBC / HPF 0-5 0 - 5 RBC/hpf   WBC, UA 0-5 0 - 5 WBC/hpf   Bacteria, UA NONE SEEN NONE SEEN   Squamous Epithelial / HPF 0-5 0 - 5 /HPF   Mucus PRESENT     Comment: Performed at [facility], [place] [address], [place], [place] [address]  Troponin I (High Sensitivity)     Status: None   Collection Time: 05/16/22  8:12 AM  Result Value Ref Range   Troponin I (High Sensitivity) 11 <18 ng/L    Comment: (NOTE) Elevated high sensitivity troponin I (hsTnI) values and  significant  changes across serial measurements may suggest ACS but many other  chronic and acute conditions are known to elevate hsTnI results.  Refer to the "Links" section for chest pain algorithms and additional  guidance. Performed at [facility], [place] [address] [place], [place] [address]   SARS Coronavirus 2 by RT PCR (hospital order, performed in [facility] hospital lab) *cepheid single result test* Anterior Nasal Swab     Status: None   Collection Time: 05/16/22  9:41 AM   Specimen: Anterior Nasal Swab  Result Value Ref Range   SARS Coronavirus 2 by RT PCR NEGATIVE NEGATIVE    Comment: Performed at [facility], [address], [place], [place] [address]  Sodium, urine, random     Status: None   Collection Time: 05/16/22  9:53 AM  Result Value Ref Range   Sodium, Ur 107 mmol/L    Comment: Performed at [facility] [address], [place], [place] [address]  Osmolality, urine     Status: None   Collection Time: 05/16/22  9:53 AM  Result Value Ref Range   Osmolality, Ur 738 300 - 900 mOsm/kg    Comment: Performed at [facility] [address] [place], [place]  [address]   CT ABDOMEN PELVIS W CONTRAST  Result Date: 05/16/2022 CLINICAL DATA:  Abdominal pain EXAM: CT ABDOMEN AND PELVIS WITH CONTRAST TECHNIQUE: Multidetector CT imaging of the abdomen and pelvis was performed using the standard protocol following bolus administration of intravenous contrast. RADIATION DOSE REDUCTION: This exam was performed according to the departmental dose-optimization program which includes automated exposure control, adjustment of the mA and/or kV according to patient size and/or use of iterative reconstruction technique. CONTRAST:  54mL OMNIPAQUE IOHEXOL 350 MG/ML SOLN COMPARISON:  CT AP 02/11/18 FINDINGS: Lower chest: Please see separately dictated CTA chest for thoracic findings including a masslike lesion in the right lower lobe. Hepatobiliary: There hepatomegaly with  diffusely heterogeneous appearance of the hepatic parenchyma. There is a more focal discrete lesion in the right hepatic lobe measuring up to 3.4 x 4.0 cm. There is an additional discrete lesion in the inferior right hepatic lobe measuring approximately 2.7 x 2.3 cm (series 3, image 30). Findings are worrisome for diffuse metastatic disease. Portal vein is contrast opacified. Gallbladder is normal in appearance. No evidence of intra or extrahepatic biliary ductal dilatation. Pancreas: No evidence of pancreatic ductal dilatation. No evidence of peripancreatic fat stranding. Spleen: Spleen is normal in size. There is a new hypodense lesion in the posterior aspect of the spleen measuring 1.8 x 2.0 cm (series 3, image 16). Adrenals/Urinary Tract: Bilateral adrenal glands are normal in appearance without evidence of focal lesion. Bilateral kidneys are normal in size and enhance homogeneously. No evidence of hydronephrosis or nephrolithiasis. Stomach/Bowel: The stomach, small bowel, large bowel are normal in caliber without evidence of obstruction. There is diverticulosis with persistent long segment wall thickening sigmoid colon (series 3, image 60). There is no evidence of surrounding inflammatory fat stranding. Vascular/Lymphatic: No significant vascular findings are present. No enlarged abdominal or pelvic lymph nodes. Aortic atherosclerotic calcification. Reproductive: Prostate is unremarkable. Other: No abdominal wall hernia or abnormality. No abdominopelvic ascites. Musculoskeletal: There is a lytic lesion of the superior endplate of L3 (series 7, image 70) worrisome for osseous metastatic disease. IMPRESSION: 1. Hepatomegaly with diffusely heterogeneous appearance of the hepatic parenchyma. There are two discrete lesions in the right hepatic lobe, as above. Findings are worrisome for diffuse metastatic disease. Recommend further evaluation with a contrast enhanced MRI. 2. There is a new hypodense lesion in the  posterior aspect of the spleen measuring up to 2.0 cm. This is concerning for metastatic disease. 3. There is a lytic lesion of the superior endplate of L3 worrisome for osseous metastatic disease. Recommend futher evaluation with a contrast enhanaced lumbar spine MRI. 4. There is diverticulosis with persistent long segment wall thickening of the sigmoid colon. There is no evidence of surrounding inflammatory fat stranding. Findings may represent sequela of chronic diverticulitis. However, given the long segment wall thickening, recommend correlation with colonoscopy to exclude underlying malignancy. Aortic Atherosclerosis (ICD10-I70.0). Electronically Signed   By: Marin Roberts M.D.   On: 05/16/2022 08:14   CT Angio Chest Pulmonary Embolism (PE) W or WO Contrast  Result Date: 05/16/2022 CLINICAL DATA:  Pulmonary embolism suspected, high probability. Shortness of breath which is increased for 1 week EXAM: CT ANGIOGRAPHY CHEST WITH CONTRAST TECHNIQUE: Multidetector CT imaging of the chest was performed using the standard protocol during bolus administration of intravenous contrast. Multiplanar CT image reconstructions and MIPs were obtained to evaluate the vascular anatomy. RADIATION DOSE REDUCTION: This exam was performed according to the departmental dose-optimization program which includes automated exposure control, adjustment of the mA and/or  kV according to patient size and/or use of iterative reconstruction technique. CONTRAST:  52mL OMNIPAQUE IOHEXOL 350 MG/ML SOLN COMPARISON:  03/30/2017 FINDINGS: Cardiovascular: Satisfactory opacification of the pulmonary arteries to the segmental level. No evidence of pulmonary embolism. Extrinsic narrowing of medial basal segmental pulmonary arteries on the right due to mass. Normal heart size. No pericardial effusion. Mediastinum/Nodes: Subcarinal, right hilar, and right paratracheal lymphadenopathy. A subcarinal node measures 2.7 cm in diameter. Lungs/Pleura:  Lobulated mass in the right lower lobe, peribronchovascular and measuring at least 4 cm in length with probable postobstructive dilatation of associated distal bronchi. Generalized airway thickening. Upper Abdomen: Cirrhotic appearance of the liver since prior with large caudate lobe and diffuse surface lobulation. There are ill-defined lesions in the central liver measuring 3.5 cm and in the right lower lobe measuring 2.4 cm. Musculoskeletal: No acute or aggressive finding Review of the MIP images confirms the above findings. Prelim sent in epic chat. IMPRESSION: 1. Right lower lobe mass with subcarinal and ipsilateral mediastinal lymphadenopathy, most consistent with metastatic bronchogenic carcinoma. 2. Two ill-defined lesions within the cirrhotic liver which could be primary or metastatic. 3. Negative for pulmonary embolism. Electronically Signed   By: Jorje Guild M.D.   On: 05/16/2022 07:23   DG Chest 2 View  Result Date: 05/16/2022 CLINICAL DATA:  62 year old male with history of shortness of breath. EXAM: CHEST - 2 VIEW COMPARISON:  Chest x-ray 03/30/2017. FINDINGS: Lung volumes are normal. No consolidative airspace disease. No pleural effusions. No pneumothorax. No pulmonary nodule or mass noted. Pulmonary vasculature and the cardiomediastinal silhouette are within normal limits. IMPRESSION: No radiographic evidence of acute cardiopulmonary disease. Electronically Signed   By: Vinnie Langton M.D.   On: 05/16/2022 05:48    Pending Labs Unresulted Labs (From admission, onward)     Start     Ordered   05/17/22 0500  HIV Antibody (routine testing w rflx)  (HIV Antibody (Routine testing w reflex) panel)  Tomorrow morning,   R        05/16/22 0933   05/17/22 0500  CBC  Tomorrow morning,   R        05/16/22 0933   05/17/22 0500  Comprehensive metabolic panel  Tomorrow morning,   R        05/16/22 0933   05/17/22 3007  Basic metabolic panel  Daily,   R      05/16/22 0953   05/16/22 1040   Osmolality  Once,   R        05/16/22 1040            Vitals/Pain Today's Vitals   05/16/22 0955 05/16/22 0957 05/16/22 1302 05/16/22 1305  BP:    (!) 120/103  Pulse: 76 75  83  Resp: 17 17  (!) 22  Temp:   98 F (36.7 C)   TempSrc:   Oral   SpO2: 95% 95%  95%  PainSc:   0-No pain     Isolation Precautions No active isolations  Medications Medications  albuterol (VENTOLIN HFA) 108 (90 Base) MCG/ACT inhaler 2 puff (2 puffs Inhalation Given 05/16/22 0517)  acetaminophen (TYLENOL) tablet 650 mg (has no administration in time range)    Or  acetaminophen (TYLENOL) suppository 650 mg (has no administration in time range)  ondansetron (ZOFRAN) tablet 4 mg (has no administration in time range)    Or  ondansetron (ZOFRAN) injection 4 mg (has no administration in time range)  amLODipine (NORVASC) tablet 10 mg (10 mg Oral Given  05/16/22 1327)  metoprolol tartrate (LOPRESSOR) tablet 50 mg (50 mg Oral Given 05/16/22 1327)  pantoprazole (PROTONIX) EC tablet 40 mg (40 mg Oral Given 05/16/22 1331)  AeroChamber Plus Flo-Vu Large MISC (1 each  Given 05/16/22 0517)  iohexol (OMNIPAQUE) 350 MG/ML injection 75 mL (75 mLs Intravenous Contrast Given 05/16/22 0651)  iohexol (OMNIPAQUE) 350 MG/ML injection 75 mL (75 mLs Intravenous Contrast Given 05/16/22 0759)  furosemide (LASIX) injection 20 mg (20 mg Intravenous Given 05/16/22 1331)    Mobility walks     Focused Assessments Pulmonary Assessment Handoff:  Lung sounds: Bilateral Breath Sounds: Clear L Breath Sounds: Expiratory wheezes, Inspiratory wheezes R Breath Sounds: Expiratory wheezes, Inspiratory wheezes O2 Device: Room Air      R Recommendations: See Admitting Provider Note  Report given to:   Additional Notes: use the urinal

## 2022-05-16 NOTE — ED Notes (Signed)
Pt in CT.

## 2022-05-16 NOTE — ED Provider Notes (Signed)
Care of the patient assumed at signout. 9:15 AM Patient in no distress.  I discussed additional findings with him, and I discussed this case with Dr. Tamala Julian, our pulmonologist.  Pulmonology will be available to facilitate investigation of his lung mass, possible bronchoscopy.  Patient will be admitted to the hospitalist team.   Carmin Muskrat, MD 05/16/22 [phone]

## 2022-05-16 NOTE — Consult Note (Signed)
NAME:  Matthew [name], MRN:  [id number], DOB:  09-08-1960, LOS: 0 ADMISSION DATE:  05/16/2022, CONSULTATION DATE:  05/16/22 REFERRING MD:  Vanita Panda, CHIEF COMPLAINT:  bloating   History of Present Illness:  62 year old man with hx of heavy smoking presenting with insidious onset worsening DOE, orthopnea and abdominal bloating over past month.  Workup has revealed extensive cancer likely lung origin for which PCCM is consulted.  No hemoptysis, has tolerated anesthesia in past.  Workup also showing transaminitis from liver mets and hyponatremia presumably paraneoplastic.  Pertinent  Medical History  Mild OSA HTN  Significant Hospital Events: Including procedures, antibiotic start and stop dates in addition to other pertinent events   2/19 admit  Interim History / Subjective:  consult  Objective   Blood pressure (!) 186/93, pulse 76, temperature 98 F (36.7 C), resp. rate 17, SpO2 96 %.       No intake or output data in the 24 hours ending 05/16/22 0947 There were no vitals filed for this visit.  Examination: General: no distress, jaundiced HENT: Malampatti 3, MMM Lungs: Diminished, no wheezing or accessory muscle use Cardiovascular: RRR, ext warm Abdomen: protuberant, soft, hypoactive BS Extremities: no edema, +clubbing Neuro: moves ext to command Psych: Aox3, good insight  Resolved Hospital Problem list   N/A  Assessment & Plan:  Metastatic lung cancer with extensive liver and bone involvement Hyponatremia question related to liver injury vs. Paraneoplastic vs. HCTZ effect HTN Acute liver injury, alk phos elevation- related to heavy metastatic burden to liver/bone  - NPO MN for bronch/EBUS with Icard tomorrow - Check COVID and INR pre-bronch - Check cortisol/TSH and urine/serum osms although suspect low yield - Start amlodipine/metoprolol for HTN; avoid HCTZ  Best Practice (right click and "Reselect all SmartList Selections" daily)  Per primary  Labs    CBC: Recent Labs  Lab 05/16/22 0517  WBC 15.6*  NEUTROABS 13.3*  HGB 13.5  HCT 39.1  MCV 89.9  PLT 329    Basic Metabolic Panel: Recent Labs  Lab 05/16/22 0517  NA 127*  K 4.0  CL 90*  CO2 25  GLUCOSE 141*  BUN 11  CREATININE 0.71  CALCIUM 8.0*   GFR: CrCl cannot be calculated (Unknown ideal weight.). Recent Labs  Lab 05/16/22 0517 05/16/22 0535  WBC 15.6*  --   LATICACIDVEN  --  1.5    Liver Function Tests: Recent Labs  Lab 05/16/22 0517  AST 138*  ALT 310*  ALKPHOS 406*  BILITOT 4.9*  PROT 5.9*  ALBUMIN 3.3*   Recent Labs  Lab 05/16/22 0517  LIPASE 62*   No results for input(s): "AMMONIA" in the last 168 hours.  ABG No results found for: "PHART", "PCO2ART", "PO2ART", "HCO3", "TCO2", "ACIDBASEDEF", "O2SAT"   Coagulation Profile: No results for input(s): "INR", "PROTIME" in the last 168 hours.  Cardiac Enzymes: No results for input(s): "CKTOTAL", "CKMB", "CKMBINDEX", "TROPONINI" in the last 168 hours.  HbA1C: Hgb A1c MFr Bld  Date/Time Value Ref Range Status  09/13/2019 08:53 AM 6.4 (H) 4.8 - 5.6 % Final    Comment:             Prediabetes: 5.7 - 6.4          Diabetes: >6.4          Glycemic control for adults with diabetes: <7.0   04/30/2019 09:16 AM 5.8 (H) 4.8 - 5.6 % Final    Comment:  Prediabetes: 5.7 - 6.4          Diabetes: >6.4          Glycemic control for adults with diabetes: <7.0     CBG: No results for input(s): "GLUCAP" in the last 168 hours.  Review of Systems:    Positive Symptoms in bold:  Constitutional fevers, chills, weight loss, fatigue, anorexia, malaise  Eyes decreased vision, double vision, eye irritation  Ears, Nose, Mouth, Throat sore throat, trouble swallowing, sinus congestion  Cardiovascular chest pain, paroxysmal nocturnal dyspnea, lower ext edema, palpitations   Respiratory SOB, cough, DOE, hemoptysis, wheezing  Gastrointestinal nausea, vomiting, diarrhea  Genitourinary burning  with urination, trouble urinating  Musculoskeletal joint aches, joint swelling, back pain  Integumentary  rashes, skin lesions  Neurological focal weakness, focal numbness, trouble speaking, headaches  Psychiatric depression, anxiety, confusion  Endocrine polyuria, polydipsia, cold intolerance, heat intolerance  Hematologic abnormal bruising, abnormal bleeding, unexplained nose bleeds  Allergic/Immunologic recurrent infections, hives, swollen lymph nodes     Past Medical History:  He,  has a past medical history of Acute bronchitis (03/30/2017), Acute maxillary sinusitis (07/25/2017), Acute otitis media, right (05/05/2016), Acute respiratory failure with hypoxia ([place]) (03/30/2017), CAP (community acquired pneumonia) (03/31/2017), COPD (chronic obstructive pulmonary disease) ([place]), Diarrhea (10/16/2017), Diverticulitis, Ear build-up, left (08/10/2017), Hypertension, Left otitis media (07/17/2017), Pharyngitis (05/05/2016), Renal artery stenosis ([place]), and Sigmoid diverticulitis (02/11/2018).   Surgical History:   Past Surgical History:  Procedure Laterality Date   HERNIA REPAIR     HERNIA REPAIR  1966     Social History:   reports that he quit smoking about 5 years ago. His smoking use included cigarettes. He smoked an average of 1 pack per day. He has never used smokeless tobacco. He reports that he does not currently use drugs after having used the following drugs: Marijuana. Frequency: 2.00 times per week.   Family History:  His family history includes Cancer in his mother; Lymphoma in his father. There is no history of Colon cancer, Rectal cancer, or Stomach cancer.   Allergies No Known Allergies   Home Medications  Prior to Admission medications   Medication Sig Start Date End Date Taking? Authorizing Provider  amLODipine (NORVASC) 10 MG tablet TAKE 1 TABLET BY MOUTH DAILY 04/12/22   Sueanne Margarita, MD  aspirin EC 81 MG tablet Take 1 tablet (81 mg total) by mouth daily. Swallow  whole. 05/31/21   Belva Crome, MD  clonazePAM Bobbye [place]) 0.5 MG tablet TAKE 1/2 TABLET BY MOUTH AT BEDTIME 03/16/21   Dohmeier, Asencion Partridge, MD  famotidine-calcium carbonate-magnesium hydroxide (PEPCID COMPLETE) [phone] MG chewable tablet Chew 1 tablet by mouth daily as needed.    [provider]  hydrochlorothiazide (MICROZIDE) 12.5 MG capsule Take 1 capsule (12.5 mg total) by mouth daily. 02/09/21   Sueanne Margarita, MD  metoprolol succinate (TOPROL-XL) 100 MG 24 hr tablet Take 1 tablet (100 mg total) by mouth daily. 04/06/22   Sueanne Margarita, MD  metoprolol tartrate (LOPRESSOR) 50 MG tablet Take one tablet by mouth 2 hours prior to CT 05/31/21   Belva Crome, MD  nitroGLYCERIN (NITROSTAT) 0.4 MG SL tablet Place 1 tablet (0.4 mg total) under the tongue every 5 (five) minutes as needed for chest pain. 05/31/21 08/29/21  Belva Crome, MD  rosuvastatin (CRESTOR) 40 MG tablet Take 1 tablet (40 mg total) by mouth daily. 12/31/21   Sueanne Margarita, MD     Critical care time: N/A

## 2022-05-16 NOTE — H&P (View-Only) (Signed)
NAME:  Matthew [name], MRN:  [id number], DOB:  Dec 02, 1960, LOS: 0 ADMISSION DATE:  05/16/2022, CONSULTATION DATE:  05/16/22 REFERRING MD:  Vanita Panda, CHIEF COMPLAINT:  bloating   History of Present Illness:  62 year old man with hx of heavy smoking presenting with insidious onset worsening DOE, orthopnea and abdominal bloating over past month.  Workup has revealed extensive cancer likely lung origin for which PCCM is consulted.  No hemoptysis, has tolerated anesthesia in past.  Workup also showing transaminitis from liver mets and hyponatremia presumably paraneoplastic.  Pertinent  Medical History  Mild OSA HTN  Significant Hospital Events: Including procedures, antibiotic start and stop dates in addition to other pertinent events   2/19 admit  Interim History / Subjective:  consult  Objective   Blood pressure (!) 186/93, pulse 76, temperature 98 F (36.7 C), resp. rate 17, SpO2 96 %.       No intake or output data in the 24 hours ending 05/16/22 0947 There were no vitals filed for this visit.  Examination: General: no distress, jaundiced HENT: Malampatti 3, MMM Lungs: Diminished, no wheezing or accessory muscle use Cardiovascular: RRR, ext warm Abdomen: protuberant, soft, hypoactive BS Extremities: no edema, +clubbing Neuro: moves ext to command Psych: Aox3, good insight  Resolved Hospital Problem list   N/A  Assessment & Plan:  Metastatic lung cancer with extensive liver and bone involvement Hyponatremia question related to liver injury vs. Paraneoplastic vs. HCTZ effect HTN Acute liver injury, alk phos elevation- related to heavy metastatic burden to liver/bone  - NPO MN for bronch/EBUS with Icard tomorrow - Check COVID and INR pre-bronch - Check cortisol/TSH and urine/serum osms although suspect low yield - Start amlodipine/metoprolol for HTN; avoid HCTZ  Best Practice (right click and "Reselect all SmartList Selections" daily)  Per primary  Labs    CBC: Recent Labs  Lab 05/16/22 0517  WBC 15.6*  NEUTROABS 13.3*  HGB 13.5  HCT 39.1  MCV 89.9  PLT 767    Basic Metabolic Panel: Recent Labs  Lab 05/16/22 0517  NA 127*  K 4.0  CL 90*  CO2 25  GLUCOSE 141*  BUN 11  CREATININE 0.71  CALCIUM 8.0*   GFR: CrCl cannot be calculated (Unknown ideal weight.). Recent Labs  Lab 05/16/22 0517 05/16/22 0535  WBC 15.6*  --   LATICACIDVEN  --  1.5    Liver Function Tests: Recent Labs  Lab 05/16/22 0517  AST 138*  ALT 310*  ALKPHOS 406*  BILITOT 4.9*  PROT 5.9*  ALBUMIN 3.3*   Recent Labs  Lab 05/16/22 0517  LIPASE 62*   No results for input(s): "AMMONIA" in the last 168 hours.  ABG No results found for: "PHART", "PCO2ART", "PO2ART", "HCO3", "TCO2", "ACIDBASEDEF", "O2SAT"   Coagulation Profile: No results for input(s): "INR", "PROTIME" in the last 168 hours.  Cardiac Enzymes: No results for input(s): "CKTOTAL", "CKMB", "CKMBINDEX", "TROPONINI" in the last 168 hours.  HbA1C: Hgb A1c MFr Bld  Date/Time Value Ref Range Status  09/13/2019 08:53 AM 6.4 (H) 4.8 - 5.6 % Final    Comment:             Prediabetes: 5.7 - 6.4          Diabetes: >6.4          Glycemic control for adults with diabetes: <7.0   04/30/2019 09:16 AM 5.8 (H) 4.8 - 5.6 % Final    Comment:  Prediabetes: 5.7 - 6.4          Diabetes: >6.4          Glycemic control for adults with diabetes: <7.0     CBG: No results for input(s): "GLUCAP" in the last 168 hours.  Review of Systems:    Positive Symptoms in bold:  Constitutional fevers, chills, weight loss, fatigue, anorexia, malaise  Eyes decreased vision, double vision, eye irritation  Ears, Nose, Mouth, Throat sore throat, trouble swallowing, sinus congestion  Cardiovascular chest pain, paroxysmal nocturnal dyspnea, lower ext edema, palpitations   Respiratory SOB, cough, DOE, hemoptysis, wheezing  Gastrointestinal nausea, vomiting, diarrhea  Genitourinary burning  with urination, trouble urinating  Musculoskeletal joint aches, joint swelling, back pain  Integumentary  rashes, skin lesions  Neurological focal weakness, focal numbness, trouble speaking, headaches  Psychiatric depression, anxiety, confusion  Endocrine polyuria, polydipsia, cold intolerance, heat intolerance  Hematologic abnormal bruising, abnormal bleeding, unexplained nose bleeds  Allergic/Immunologic recurrent infections, hives, swollen lymph nodes     Past Medical History:  He,  has a past medical history of Acute bronchitis (03/30/2017), Acute maxillary sinusitis (07/25/2017), Acute otitis media, right (05/05/2016), Acute respiratory failure with hypoxia ([place]) (03/30/2017), CAP (community acquired pneumonia) (03/31/2017), COPD (chronic obstructive pulmonary disease) ([place]), Diarrhea (10/16/2017), Diverticulitis, Ear build-up, left (08/10/2017), Hypertension, Left otitis media (07/17/2017), Pharyngitis (05/05/2016), Renal artery stenosis ([place]), and Sigmoid diverticulitis (02/11/2018).   Surgical History:   Past Surgical History:  Procedure Laterality Date   HERNIA REPAIR     HERNIA REPAIR  1966     Social History:   reports that he quit smoking about 5 years ago. His smoking use included cigarettes. He smoked an average of 1 pack per day. He has never used smokeless tobacco. He reports that he does not currently use drugs after having used the following drugs: Marijuana. Frequency: 2.00 times per week.   Family History:  His family history includes Cancer in his mother; Lymphoma in his father. There is no history of Colon cancer, Rectal cancer, or Stomach cancer.   Allergies No Known Allergies   Home Medications  Prior to Admission medications   Medication Sig Start Date End Date Taking? Authorizing Provider  amLODipine (NORVASC) 10 MG tablet TAKE 1 TABLET BY MOUTH DAILY 04/12/22   Sueanne Margarita, MD  aspirin EC 81 MG tablet Take 1 tablet (81 mg total) by mouth daily. Swallow  whole. 05/31/21   Belva Crome, MD  clonazePAM [place]bye [place]) 0.5 MG tablet TAKE 1/2 TABLET BY MOUTH AT BEDTIME 03/16/21   Dohmeier, Asencion Partridge, MD  famotidine-calcium carbonate-magnesium hydroxide (PEPCID COMPLETE) 10-800-165 MG chewable tablet Chew 1 tablet by mouth daily as needed.    [provider]  hydrochlorothiazide (MICROZIDE) 12.5 MG capsule Take 1 capsule (12.5 mg total) by mouth daily. 02/09/21   Sueanne Margarita, MD  metoprolol succinate (TOPROL-XL) 100 MG 24 hr tablet Take 1 tablet (100 mg total) by mouth daily. 04/06/22   Sueanne Margarita, MD  metoprolol tartrate (LOPRESSOR) 50 MG tablet Take one tablet by mouth 2 hours prior to CT 05/31/21   Belva Crome, MD  nitroGLYCERIN (NITROSTAT) 0.4 MG SL tablet Place 1 tablet (0.4 mg total) under the tongue every 5 (five) minutes as needed for chest pain. 05/31/21 08/29/21  Belva Crome, MD  rosuvastatin (CRESTOR) 40 MG tablet Take 1 tablet (40 mg total) by mouth daily. 12/31/21   Sueanne Margarita, MD     Critical care time: N/A

## 2022-05-16 NOTE — ED Triage Notes (Signed)
Pt reports he has increased SOB X1 week. Pt states that he has several issues that "are concerning" including dark urine, bloated stomach, has gained about 10lb and some dizziness that comes and goes in the past week and "just feeling very uncomfortable.

## 2022-05-16 NOTE — ED Notes (Signed)
Pt given a sandwhich bag and a coke.

## 2022-05-17 ENCOUNTER — Encounter (HOSPITAL_COMMUNITY): Payer: Self-pay | Admitting: Internal Medicine

## 2022-05-17 ENCOUNTER — Encounter (HOSPITAL_COMMUNITY)
Admission: EM | Disposition: A | Discharge status: Home / Self Care | Payer: Self-pay | Source: Home / Self Care | Attending: Emergency Medicine

## 2022-05-17 ENCOUNTER — Observation Stay (HOSPITAL_COMMUNITY): Payer: Commercial Managed Care - HMO | Admitting: Anesthesiology

## 2022-05-17 ENCOUNTER — Observation Stay (HOSPITAL_COMMUNITY): Payer: Commercial Managed Care - HMO

## 2022-05-17 ENCOUNTER — Observation Stay (HOSPITAL_BASED_OUTPATIENT_CLINIC_OR_DEPARTMENT_OTHER): Payer: Commercial Managed Care - HMO | Admitting: Anesthesiology

## 2022-05-17 DIAGNOSIS — Z87891 Personal history of nicotine dependence: Secondary | ICD-10-CM | POA: Diagnosis not present

## 2022-05-17 DIAGNOSIS — I10 Essential (primary) hypertension: Secondary | ICD-10-CM

## 2022-05-17 DIAGNOSIS — J449 Chronic obstructive pulmonary disease, unspecified: Secondary | ICD-10-CM | POA: Diagnosis not present

## 2022-05-17 DIAGNOSIS — R918 Other nonspecific abnormal finding of lung field: Secondary | ICD-10-CM | POA: Diagnosis not present

## 2022-05-17 DIAGNOSIS — R748 Abnormal levels of other serum enzymes: Secondary | ICD-10-CM

## 2022-05-17 DIAGNOSIS — C787 Secondary malignant neoplasm of liver and intrahepatic bile duct: Secondary | ICD-10-CM | POA: Diagnosis not present

## 2022-05-17 HISTORY — PX: FINE NEEDLE ASPIRATION: SHX5430

## 2022-05-17 HISTORY — PX: VIDEO BRONCHOSCOPY WITH ENDOBRONCHIAL ULTRASOUND: SHX6177

## 2022-05-17 LAB — HEMOGLOBIN A1C
Hgb A1c MFr Bld: 6.1 % — ABNORMAL HIGH (ref 4.8–5.6)
Mean Plasma Glucose: 128.37 mg/dL

## 2022-05-17 LAB — COMPREHENSIVE METABOLIC PANEL
ALT: 313 U/L — ABNORMAL HIGH (ref 0–44)
AST: 136 U/L — ABNORMAL HIGH (ref 15–41)
Albumin: 3.3 g/dL — ABNORMAL LOW (ref 3.5–5.0)
Alkaline Phosphatase: 433 U/L — ABNORMAL HIGH (ref 38–126)
Anion gap: 12 (ref 5–15)
BUN: 14 mg/dL (ref 8–23)
CO2: 30 mmol/L (ref 22–32)
Calcium: 8 mg/dL — ABNORMAL LOW (ref 8.9–10.3)
Chloride: 88 mmol/L — ABNORMAL LOW (ref 98–111)
Creatinine, Ser: 0.95 mg/dL (ref 0.61–1.24)
GFR, Estimated: >60 mL/min (ref >60)
Glucose, Bld: 134 mg/dL — ABNORMAL HIGH (ref 70–99)
Potassium: 3.4 mmol/L — ABNORMAL LOW (ref 3.5–5.1)
Sodium: 130 mmol/L — ABNORMAL LOW (ref 135–145)
Total Bilirubin: 5.5 mg/dL — ABNORMAL HIGH (ref 0.3–1.2)
Total Protein: 6 g/dL — ABNORMAL LOW (ref 6.5–8.1)

## 2022-05-17 LAB — CBC
HCT: 40 % (ref 39.0–52.0)
Hemoglobin: 14.1 g/dL (ref 13.0–17.0)
MCH: 31.1 pg (ref 26.0–34.0)
MCHC: 35.3 g/dL (ref 30.0–36.0)
MCV: 88.1 fL (ref 80.0–100.0)
Platelets: 270 10*3/uL (ref 150–400)
RBC: 4.54 MIL/uL (ref 4.22–5.81)
RDW: 14.5 % (ref 11.5–15.5)
WBC: 14.5 10*3/uL — ABNORMAL HIGH (ref 4.0–10.5)
nRBC: 0 % (ref 0.0–0.2)

## 2022-05-17 LAB — OSMOLALITY: Osmolality: 280 mOsm/kg (ref 275–295)

## 2022-05-17 LAB — GLUCOSE, CAPILLARY
Glucose-Capillary: 163 mg/dL — ABNORMAL HIGH (ref 70–99)
Glucose-Capillary: 195 mg/dL — ABNORMAL HIGH (ref 70–99)

## 2022-05-17 LAB — HIV ANTIBODY (ROUTINE TESTING W REFLEX): HIV Screen 4th Generation wRfx: NONREACTIVE

## 2022-05-17 SURGERY — BRONCHOSCOPY, WITH EBUS
Anesthesia: General

## 2022-05-17 MED ORDER — GADOBUTROL 1 MMOL/ML IV SOLN
9.0000 mL | Freq: Once | INTRAVENOUS | Status: AC | PRN
  Administered 2022-05-17: 9 mL via INTRAVENOUS

## 2022-05-17 MED ORDER — SUGAMMADEX SODIUM 200 MG/2ML IV SOLN
INTRAVENOUS | Status: DC | PRN
  Administered 2022-05-17: 200 mg via INTRAVENOUS

## 2022-05-17 MED ORDER — PROPOFOL 500 MG/50ML IV EMUL
INTRAVENOUS | Status: DC | PRN
  Administered 2022-05-17: 125 ug/kg/min via INTRAVENOUS

## 2022-05-17 MED ORDER — FENTANYL CITRATE (PF) 250 MCG/5ML IJ SOLN
INTRAMUSCULAR | Status: DC | PRN
  Administered 2022-05-17: 50 ug via INTRAVENOUS

## 2022-05-17 MED ORDER — PROPOFOL 10 MG/ML IV BOLUS
INTRAVENOUS | Status: DC | PRN
  Administered 2022-05-17: 150 mg via INTRAVENOUS

## 2022-05-17 MED ORDER — POTASSIUM CHLORIDE 10 MEQ/100ML IV SOLN
10.0000 meq | INTRAVENOUS | Status: AC
  Filled 2022-05-17: qty 100

## 2022-05-17 MED ORDER — ROCURONIUM BROMIDE 10 MG/ML (PF) SYRINGE
PREFILLED_SYRINGE | INTRAVENOUS | Status: DC | PRN
  Administered 2022-05-17: 60 mg via INTRAVENOUS

## 2022-05-17 MED ORDER — ONDANSETRON HCL 4 MG/2ML IJ SOLN
INTRAMUSCULAR | Status: DC | PRN
  Administered 2022-05-17: 4 mg via INTRAVENOUS

## 2022-05-17 MED ORDER — POTASSIUM CHLORIDE 10 MEQ/100ML IV SOLN: 10.0000 meq | INTRAVENOUS | Status: DC

## 2022-05-17 MED ORDER — LACTATED RINGERS IV SOLN: INTRAVENOUS | Status: DC

## 2022-05-17 MED ORDER — ONDANSETRON HCL 4 MG/2ML IJ SOLN: 4.0000 mg | Freq: Once | INTRAMUSCULAR | Status: DC | PRN

## 2022-05-17 MED ORDER — CHLORHEXIDINE GLUCONATE 0.12 % MT SOLN
OROMUCOSAL | Status: AC
  Filled 2022-05-17: qty 15

## 2022-05-17 MED ORDER — DEXAMETHASONE SODIUM PHOSPHATE 10 MG/ML IJ SOLN
INTRAMUSCULAR | Status: DC | PRN
  Administered 2022-05-17: 10 mg via INTRAVENOUS

## 2022-05-17 MED ORDER — PHENYLEPHRINE 80 MCG/ML (10ML) SYRINGE FOR IV PUSH (FOR BLOOD PRESSURE SUPPORT)
PREFILLED_SYRINGE | INTRAVENOUS | Status: DC | PRN
  Administered 2022-05-17 (×3): 160 ug via INTRAVENOUS

## 2022-05-17 MED ORDER — LIDOCAINE 2% (20 MG/ML) 5 ML SYRINGE
INTRAMUSCULAR | Status: DC | PRN
  Administered 2022-05-17: 100 mg via INTRAVENOUS

## 2022-05-17 SURGICAL SUPPLY — 29 items

## 2022-05-17 NOTE — Progress Notes (Signed)
Pt upset that he unable to have anything to eat. Nurse explained that he needed to be NPO for about 6 hours for MRI of abdomen. Pt stated that I was trying to starve him, Nurse reminded him of burrito that he ate when sister was visiting, Pt stated that nurse didn't bring it so it doesn't count and that the staff at the hospital are neglecting him because nurse had not seen him in 1.5 hours. Pt needed to have potassium but stated that he was going to take a shower instead.

## 2022-05-17 NOTE — Transfer of Care (Signed)
Immediate Anesthesia Transfer of Care Note  Patient: Matthew [name]  Procedure(s) Performed: VIDEO BRONCHOSCOPY WITH ENDOBRONCHIAL ULTRASOUND FINE NEEDLE ASPIRATION (FNA) LINEAR  Patient Location: PACU  Anesthesia Type:General  Level of Consciousness: awake, alert , and oriented  Airway & Oxygen Therapy: Patient Spontanous Breathing and Patient connected to face mask oxygen  Post-op Assessment: Report given to RN and Post -op Vital signs reviewed and stable  Post vital signs: Reviewed and stable  Last Vitals:  Vitals Value Taken Time  BP 143/81 05/17/22 0954  Temp    Pulse 87 05/17/22 0956  Resp 20 05/17/22 0956  SpO2 95 % 05/17/22 0956  Vitals shown include unvalidated device data.  Last Pain:  Vitals:   05/17/22 0836  TempSrc: Oral  PainSc: 0-No pain         Complications: No notable events documented.

## 2022-05-17 NOTE — Consult Note (Signed)
[place] [phone]   [phone]  CONSULT NOTE  REFERRING PHYSICIAN: Dr. Eric [place]  REASON FOR CONSULTATION:  62 years old white male with highly suspicious metastatic lung cancer.  HPI Matthew [name] is a 62 y.o. male with past medical history significant for multiple medical problems including history of COPD, diverticulosis and diverticulitis, hypertension, renal artery stenosis and long history of smoking but quit 5 years ago.  She also has a history of bronchitis, maxillary sinusitis as well as right otitis media and left otitis media with pharyngitis.  He has been complaining of worsening dyspnea over the last few weeks with weight loss and arthritis and abdominal distention.  He was advised by his daughter who is a ICU nurse in [place] to seek medical attention and he came to the emergency department for evaluation.  CT angiogram of the chest showed lobulated mass in the right lower lobe, peribronchovascular and measuring at least 4.0 cm in length with probable postobstructive dilatation of the associated distal bronchi.  There was also subcarinal, right hilar and right paratracheal lymphadenopathy and subcarinal lymph node measures 2.7 cm.  There was also 2 ill-defined lesion within the cirrhotic liver which could be primary or metastatic.  The patient had CT scan of the abdomen and pelvis on May 16, 2022 and that showed hepatomegaly with diffusely heterogeneous appearance of the hepatic parenchyma.  There is a more focal discrete lesion in the right hepatic lobe measuring up to 3.4 x 4.0 cm.  There is an additional discrete lesion in the inferior right hepatic lobe measuring 2.7 x 2.3 cm and the findings are worrisome for diffuse metastatic disease.  There was also a new hypodense lesion in the posterior aspect of the spleen measuring 1.8 x 2.0 cm.  Scan also showed a lytic lesion of the superior endplate of L3 worrisome for osseous metastatic  disease.  Interestingly there was no abdominal pelvic ascites.  There is diverticulosis with persistent long segment wall thickening of the sigmoid colon and no evidence of surrounding inflammation.  The patient mentions that he had a colonoscopy 3 years ago and was unremarkable.  Also had MRI of the brain performed earlier today and that showed no evidence of metastatic disease to the brain.  He was seen by Dr. Valeta Harms and underwent bronchoscopy with endobronchial ultrasound procedure and biopsy of lymph node of station 7.  The final pathology is still pending. I was asked to see the patient today for evaluation and recommendation regarding his condition. When seen today he is feeling fine except for the baseline shortness of breath which is better and he continues to have dark tea colored urine with abdominal distention.  He has no nausea, vomiting, diarrhea or constipation.  He has no current chest pain or hemoptysis.  He has no headache or visual changes. Father had lymphoma mother had breast cancer. The patient is single and has 1 daughter who works as a Warden/ranger in [place].  He worked in several jobs.  He has a history of smoking for around 40 years and quit 5 years ago.  He also used to drink alcohol but much less recently.  He also has a history of marijuana smoking. HPI  Past Medical History:  Diagnosis Date   Acute bronchitis 03/30/2017   Acute maxillary sinusitis 07/25/2017   Acute otitis media, right 05/05/2016   Acute respiratory failure with hypoxia ([place]) 03/30/2017   CAP (community acquired pneumonia) 03/31/2017   COPD (  chronic obstructive pulmonary disease) ([place])    Diarrhea 10/16/2017   Diverticulitis    Ear build-up, left 08/10/2017   Hypertension    Left otitis media 07/17/2017   Pharyngitis 05/05/2016   Renal artery stenosis ([facility])    1 to 59% bilateral by Dopplers 12/2020   Sigmoid diverticulitis 02/11/2018    Past Surgical History:  Procedure Laterality Date   HERNIA  REPAIR     HERNIA REPAIR  1966    Family History  Problem Relation Age of Onset   Lymphoma Father    Cancer Mother        breast   Colon cancer Neg Hx    Rectal cancer Neg Hx    Stomach cancer Neg Hx     Social History Social History   Tobacco Use   Smoking status: Former    Packs/day: 1.00    Types: Cigarettes    Quit date: 03/30/2017    Years since quitting: 5.1   Smokeless tobacco: Never  Vaping Use   Vaping Use: Never used  Substance Use Topics   Drug use: Not Currently    Frequency: 2.0 times per week    Types: Marijuana    No Known Allergies  Current Facility-Administered Medications  Medication Dose Route Frequency Provider Last Rate Last Admin   acetaminophen (TYLENOL) tablet 650 mg  650 mg Oral Q6H PRN Reubin Milan, MD       Or   acetaminophen (TYLENOL) suppository 650 mg  650 mg Rectal Q6H PRN Reubin Milan, MD       albuterol (VENTOLIN HFA) 108 (90 Base) MCG/ACT inhaler 2 puff  2 puff Inhalation Q2H PRN Orpah Greek, MD   2 puff at 05/16/22 0517   amLODipine (NORVASC) tablet 10 mg  10 mg Oral Daily Candee Furbish, MD   10 mg at 05/16/22 1327   furosemide (LASIX) injection 20 mg  20 mg Intravenous BID Reubin Milan, MD   20 mg at 05/17/22 1801   melatonin tablet 5 mg  5 mg Oral QHS PRN Kathryne Eriksson, NP   5 mg at 05/16/22 2322   metoprolol tartrate (LOPRESSOR) tablet 50 mg  50 mg Oral BID Candee Furbish, MD   50 mg at 05/16/22 2128   ondansetron (ZOFRAN) tablet 4 mg  4 mg Oral Q6H PRN Reubin Milan, MD       Or   ondansetron [facility]) injection 4 mg  4 mg Intravenous Q6H PRN Reubin Milan, MD       pantoprazole (PROTONIX) EC tablet 40 mg  40 mg Oral Daily Reubin Milan, MD   40 mg at 05/16/22 1331    Review of Systems  Constitutional: positive for fatigue and weight loss Eyes: negative Ears, nose, mouth, throat, and face: negative Respiratory: positive for cough and dyspnea on exertion Cardiovascular:  negative Gastrointestinal: positive for change in bowel habits and abdominal distention Genitourinary:positive for dark tea colored urine Integument/breast: negative Hematologic/lymphatic: negative Musculoskeletal:negative Neurological: negative Behavioral/Psych: negative Endocrine: negative Allergic/Immunologic: negative  Physical Exam  GLO:VFIEP, healthy, no distress, well nourished, well developed, and anxious SKIN: skin color, texture, turgor are normal, no rashes or significant lesions HEAD: Normocephalic, No masses, lesions, tenderness or abnormalities EYES: normal, PERRLA, Conjunctiva are pink and non-injected EARS: External ears normal, Canals clear OROPHARYNX:no exudate, no erythema, and lips, buccal mucosa, and tongue normal  NECK: supple, no adenopathy, no JVD LYMPH:  no palpable lymphadenopathy, no hepatosplenomegaly LUNGS: clear to auscultation ,  and palpation HEART: regular rate & rhythm, no murmurs, and no gallops ABDOMEN:obese and very distended BACK: Back symmetric, no curvature., No CVA tenderness EXTREMITIES:no joint deformities, effusion, or inflammation, no edema  NEURO: alert & oriented x 3 with fluent speech, no focal motor/sensory deficits  PERFORMANCE STATUS: ECOG 1  LABORATORY DATA: Lab Results  Component Value Date   WBC 14.5 (H) 05/17/2022   HGB 14.1 05/17/2022   HCT 40.0 05/17/2022   MCV 88.1 05/17/2022   PLT 270 05/17/2022    @LASTCHEM @  RADIOGRAPHIC STUDIES: MR BRAIN W WO CONTRAST  Result Date: 05/17/2022 CLINICAL DATA:  Non-small cell lung cancer (NSCLC), staging EXAM: MRI HEAD WITHOUT AND WITH CONTRAST TECHNIQUE: Multiplanar, multiecho pulse sequences of the brain and surrounding structures were obtained without and with intravenous contrast. CONTRAST:  32mL GADAVIST GADOBUTROL 1 MMOL/ML IV SOLN COMPARISON:  None Available. FINDINGS: Brain: No acute infarction, hemorrhage, hydrocephalus, extra-axial collection or mass lesion. No pathologic  enhancement. Vascular: Major arterial flow voids are maintained at the skull base. Skull and upper cervical spine: Normal marrow signal. Sinuses/Orbits: Clear sinuses.  No acute orbital findings. IMPRESSION: No evidence of acute intracranial abnormality or metastatic disease. Electronically Signed   By: Margaretha Sheffield M.D.   On: 05/17/2022 14:04   CT ABDOMEN PELVIS W CONTRAST  Result Date: 05/16/2022 CLINICAL DATA:  Abdominal pain EXAM: CT ABDOMEN AND PELVIS WITH CONTRAST TECHNIQUE: Multidetector CT imaging of the abdomen and pelvis was performed using the standard protocol following bolus administration of intravenous contrast. RADIATION DOSE REDUCTION: This exam was performed according to the departmental dose-optimization program which includes automated exposure control, adjustment of the mA and/or kV according to patient size and/or use of iterative reconstruction technique. CONTRAST:  2mL OMNIPAQUE IOHEXOL 350 MG/ML SOLN COMPARISON:  CT AP 02/11/18 FINDINGS: Lower chest: Please see separately dictated CTA chest for thoracic findings including a masslike lesion in the right lower lobe. Hepatobiliary: There hepatomegaly with diffusely heterogeneous appearance of the hepatic parenchyma. There is a more focal discrete lesion in the right hepatic lobe measuring up to 3.4 x 4.0 cm. There is an additional discrete lesion in the inferior right hepatic lobe measuring approximately 2.7 x 2.3 cm (series 3, image 30). Findings are worrisome for diffuse metastatic disease. Portal vein is contrast opacified. Gallbladder is normal in appearance. No evidence of intra or extrahepatic biliary ductal dilatation. Pancreas: No evidence of pancreatic ductal dilatation. No evidence of peripancreatic fat stranding. Spleen: Spleen is normal in size. There is a new hypodense lesion in the posterior aspect of the spleen measuring 1.8 x 2.0 cm (series 3, image 16). Adrenals/Urinary Tract: Bilateral adrenal glands are normal in  appearance without evidence of focal lesion. Bilateral kidneys are normal in size and enhance homogeneously. No evidence of hydronephrosis or nephrolithiasis. Stomach/Bowel: The stomach, small bowel, large bowel are normal in caliber without evidence of obstruction. There is diverticulosis with persistent long segment wall thickening sigmoid colon (series 3, image 60). There is no evidence of surrounding inflammatory fat stranding. Vascular/Lymphatic: No significant vascular findings are present. No enlarged abdominal or pelvic lymph nodes. Aortic atherosclerotic calcification. Reproductive: Prostate is unremarkable. Other: No abdominal wall hernia or abnormality. No abdominopelvic ascites. Musculoskeletal: There is a lytic lesion of the superior endplate of L3 (series 7, image 70) worrisome for osseous metastatic disease. IMPRESSION: 1. Hepatomegaly with diffusely heterogeneous appearance of the hepatic parenchyma. There are two discrete lesions in the right hepatic lobe, as above. Findings are worrisome for diffuse metastatic disease. Recommend further  evaluation with a contrast enhanced MRI. 2. There is a new hypodense lesion in the posterior aspect of the spleen measuring up to 2.0 cm. This is concerning for metastatic disease. 3. There is a lytic lesion of the superior endplate of L3 worrisome for osseous metastatic disease. Recommend futher evaluation with a contrast enhanaced lumbar spine MRI. 4. There is diverticulosis with persistent long segment wall thickening of the sigmoid colon. There is no evidence of surrounding inflammatory fat stranding. Findings may represent sequela of chronic diverticulitis. However, given the long segment wall thickening, recommend correlation with colonoscopy to exclude underlying malignancy. Aortic Atherosclerosis (ICD10-I70.0). Electronically Signed   By: Marin Roberts M.D.   On: 05/16/2022 08:14   CT Angio Chest Pulmonary Embolism (PE) W or WO Contrast  Result Date:  05/16/2022 CLINICAL DATA:  Pulmonary embolism suspected, high probability. Shortness of breath which is increased for 1 week EXAM: CT ANGIOGRAPHY CHEST WITH CONTRAST TECHNIQUE: Multidetector CT imaging of the chest was performed using the standard protocol during bolus administration of intravenous contrast. Multiplanar CT image reconstructions and MIPs were obtained to evaluate the vascular anatomy. RADIATION DOSE REDUCTION: This exam was performed according to the departmental dose-optimization program which includes automated exposure control, adjustment of the mA and/or kV according to patient size and/or use of iterative reconstruction technique. CONTRAST:  46mL OMNIPAQUE IOHEXOL 350 MG/ML SOLN COMPARISON:  03/30/2017 FINDINGS: Cardiovascular: Satisfactory opacification of the pulmonary arteries to the segmental level. No evidence of pulmonary embolism. Extrinsic narrowing of medial basal segmental pulmonary arteries on the right due to mass. Normal heart size. No pericardial effusion. Mediastinum/Nodes: Subcarinal, right hilar, and right paratracheal lymphadenopathy. A subcarinal node measures 2.7 cm in diameter. Lungs/Pleura: Lobulated mass in the right lower lobe, peribronchovascular and measuring at least 4 cm in length with probable postobstructive dilatation of associated distal bronchi. Generalized airway thickening. Upper Abdomen: Cirrhotic appearance of the liver since prior with large caudate lobe and diffuse surface lobulation. There are ill-defined lesions in the central liver measuring 3.5 cm and in the right lower lobe measuring 2.4 cm. Musculoskeletal: No acute or aggressive finding Review of the MIP images confirms the above findings. Prelim sent in epic chat. IMPRESSION: 1. Right lower lobe mass with subcarinal and ipsilateral mediastinal lymphadenopathy, most consistent with metastatic bronchogenic carcinoma. 2. Two ill-defined lesions within the cirrhotic liver which could be primary or  metastatic. 3. Negative for pulmonary embolism. Electronically Signed   By: Jorje Guild M.D.   On: 05/16/2022 07:23   DG Chest 2 View  Result Date: 05/16/2022 CLINICAL DATA:  62 year old male with history of shortness of breath. EXAM: CHEST - 2 VIEW COMPARISON:  Chest x-ray 03/30/2017. FINDINGS: Lung volumes are normal. No consolidative airspace disease. No pleural effusions. No pneumothorax. No pulmonary nodule or mass noted. Pulmonary vasculature and the cardiomediastinal silhouette are within normal limits. IMPRESSION: No radiographic evidence of acute cardiopulmonary disease. Electronically Signed   By: Vinnie Langton M.D.   On: 05/16/2022 05:48    ASSESSMENT: This is a very pleasant 62 years old white male with highly suspicious for stage IV (T2a, N2, M1 C) lung cancer likely non-small cell pending tissue diagnosis and further staging workup.  This could be also coming from a different malignancy with his significant abdominal distention and liver dysfunction as well as history of alcohol abuse.   PLAN: I had a lengthy discussion with the patient today about his current disease condition possible treatment options. I personally and independently reviewed the scan images and  discussed the result with the patient today. I will arrange for the patient to complete the staging workup by ordering a PET scan which will be done on outpatient basis once he is discharged from the hospital. I will wait for the final pathology for more detailed discussion of his treatment options and if it is consistent with adenocarcinoma of the lung, will send the tissue and blood for molecular studies. The patient has significant liver dysfunction and this is likely secondary to his metastatic disease to the liver.  His last colonoscopy was performed in 2019 that showed tubular adenomas.  He may need reevaluation by gastroenterology if the biopsy results are consistent with gastrointestinal source. I will arrange for  the patient a follow-up appointment with me at the cancer center after discharge for more detailed discussion of his treatment options based on the pending results.  The patient voices understanding of current disease status and treatment options and is in agreement with the current care plan.  All questions were answered. The patient knows to call the clinic with any problems, questions or concerns. We can certainly see the patient much sooner if necessary.  Thank you so much for allowing me to participate in the care of Matthew [name]. I will continue to follow up the patient with you and assist in his care.  Disclaimer: This note was dictated with voice recognition software. Similar sounding words can inadvertently be transcribed and may not be corrected upon review.   Eilleen Kempf May 17, 2022, 8:57 PM

## 2022-05-17 NOTE — Anesthesia Preprocedure Evaluation (Signed)
Anesthesia Evaluation  Patient identified by MRN, date of birth, ID band Patient awake  General Assessment Comment:Metastatic lung cancer with bone mets  Reviewed: Allergy & Precautions, H&P , NPO status , Patient's Chart, lab work & pertinent test results  Airway Mallampati: II  TM Distance: >3 FB Neck ROM: Full    Dental no notable dental hx.    Pulmonary sleep apnea , COPD, former smoker   Pulmonary exam normal breath sounds clear to auscultation       Cardiovascular hypertension, Pt. on medications and Pt. on home beta blockers Normal cardiovascular exam Rhythm:Regular Rate:Normal     Neuro/Psych negative neurological ROS  negative psych ROS   GI/Hepatic negative GI ROS, Neg liver ROS,,,  Endo/Other  negative endocrine ROS    Renal/GU negative Renal ROS  negative genitourinary   Musculoskeletal negative musculoskeletal ROS (+)    Abdominal   Peds negative pediatric ROS (+)  Hematology negative hematology ROS (+)   Anesthesia Other Findings   Reproductive/Obstetrics negative OB ROS                             Anesthesia Physical Anesthesia Plan  ASA: 4  Anesthesia Plan: General   Post-op Pain Management: Minimal or no pain anticipated   Induction: Intravenous  PONV Risk Score and Plan: 2 and Ondansetron, Dexamethasone and Treatment may vary due to age or medical condition  Airway Management Planned: Oral ETT  Additional Equipment:   Intra-op Plan:   Post-operative Plan: Extubation in OR  Informed Consent: I have reviewed the patients History and Physical, chart, labs and discussed the procedure including the risks, benefits and alternatives for the proposed anesthesia with the patient or authorized representative who has indicated his/her understanding and acceptance.     Dental advisory given  Plan Discussed with: CRNA and Surgeon  Anesthesia Plan Comments:         Anesthesia Quick Evaluation

## 2022-05-17 NOTE — Consult Note (Signed)
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 Underwent bronchoscopy this am, biopsies pending  # Liver lesions.  CT scan shows a right hepatic lobe lesion measuring up to 3.4 x 4.0 cm and an additional right hepatic lobe lesions measuring approximately 2.7 x 2.3 cm. Although metastatic disease to liver suspected a primary liver cancer isn't excluded given suggestion of cirrhosis on chest CTA ( though hepatic cirrhosis not described on CT AP with contrast). Liver chemistries are abnormal though there is no biliary duct dilation on CT scan to suggest obstruction.    # Sigmoid colon wall thickening on CT scan. Possibly sequela of diverticulitis. Colon neoplasm seems unlikely   # Chronic loose stool. . He has been having more frequent urges to have a BM and this has resulted in prolonged periods of sitting on toilet which he says has led to hemorrhoidal bleeding.  # History of adenomatous colon polyps. Three small tubular adenomas removed November 2019.    Plan  Will order MRCP for further evaluation of liver lesions and abnormal liver tests.     HPI   Patient is a 62 y.o. year old male with a past medical history of adenomatous colon polyps, diverticulosis / diverticulitis, tobacco abuse, bronchitis, renal artery stenosis, COPD, prediabetes, obstructive sleep apnea not on CPAP, hypertension.   See PMH for any additional medical problems.  Dalonte was admitted 2/19 after presenting to ED with shob. He has recently gained weight and has felt bloated. Labs in ED remarkable for elevated liver chemistries in mixed pattern.    CTA chest showed RLL mass consistent with  metastatic bronchogenic carcinoma. Also,  two liver lesions within a cirrhotic liver. No evidence for PE  CT abd / pelvis showed hepatomegaly with two discrete lesions in the right hepatic lobe worrisome for diffuse metastatic disease. No biliary duct dilation. Also with suspected mets to bone and spleen and a long segment wall thickening of the sigmoid colon.   He underwent bronchoscopy with biopsies today. He also had a brain MRI. Results are pending.  Geoge quit smoking 5 years ago. He has a history of asbestos exposure.     Previous GI Evaluation:  Nov 2019 Diagnostic colonoscopy for diarrhea  - Two 5-7 mm polyps removed from transverse colon and cecum.  - Two 1-3 mm polyps removed from rectum and transverse colon. - Diverticulosis. Internal hemorrhoids. Random biopsies taken  Surgical [P], transverse-2 and cecum, and rectal, polyp (4 total) - TUBULAR ADENOMA(S) WITHOUT HIGH-GRADE DYSPLASIA OR MALIGNANCY - HYPERPLASTIC POLYP 2. Surgical [P], right colon BX - COLONIC MUCOSA WITH NO SPECIFIC HISTOPATHOLOGIC CHANGES - NEGATIVE FOR ACUTE INFLAMMATION, INCREASED INTRAEPITHELIAL LYMPHOCYTES OR THICKENED SUBEPITHELIAL COLLAGEN TABLE 3. Surgical [P], left colon BX - COLONIC MUCOSA WITH NO SPECIFIC HISTOPATHOLOGIC CHANGES - NEGATIVE FOR ACUTE INFLAMMATION, INCREASED INTRAEPITHELIAL LYMPHOCYTES OR THICKENED SUBEPITHELIAL COLLAGEN TABLE   Recent Labs and Imaging CT ABDOMEN PELVIS W CONTRAST  Result Date: 05/16/2022 CLINICAL DATA:  Abdominal pain EXAM: CT ABDOMEN AND  PELVIS WITH CONTRAST TECHNIQUE: Multidetector CT imaging of the abdomen and pelvis was performed using the standard protocol following bolus administration of intravenous contrast. RADIATION DOSE REDUCTION: This exam was performed according to the departmental dose-optimization program which includes automated exposure control, adjustment of the mA and/or kV according to patient size and/or use of iterative reconstruction  technique. CONTRAST:  23mL OMNIPAQUE IOHEXOL 350 MG/ML SOLN COMPARISON:  CT AP 02/11/18 FINDINGS: Lower chest: Please see separately dictated CTA chest for thoracic findings including a masslike lesion in the right lower lobe. Hepatobiliary: There hepatomegaly with diffusely heterogeneous appearance of the hepatic parenchyma. There is a more focal discrete lesion in the right hepatic lobe measuring up to 3.4 x 4.0 cm. There is an additional discrete lesion in the inferior right hepatic lobe measuring approximately 2.7 x 2.3 cm (series 3, image 30). Findings are worrisome for diffuse metastatic disease. Portal vein is contrast opacified. Gallbladder is normal in appearance. No evidence of intra or extrahepatic biliary ductal dilatation. Pancreas: No evidence of pancreatic ductal dilatation. No evidence of peripancreatic fat stranding. Spleen: Spleen is normal in size. There is a new hypodense lesion in the posterior aspect of the spleen measuring 1.8 x 2.0 cm (series 3, image 16). Adrenals/Urinary Tract: Bilateral adrenal glands are normal in appearance without evidence of focal lesion. Bilateral kidneys are normal in size and enhance homogeneously. No evidence of hydronephrosis or nephrolithiasis. Stomach/Bowel: The stomach, small bowel, large bowel are normal in caliber without evidence of obstruction. There is diverticulosis with persistent long segment wall thickening sigmoid colon (series 3, image 60). There is no evidence of surrounding inflammatory fat stranding. Vascular/Lymphatic: No significant vascular findings are present. No enlarged abdominal or pelvic lymph nodes. Aortic atherosclerotic calcification. Reproductive: Prostate is unremarkable. Other: No abdominal wall hernia or abnormality. No abdominopelvic ascites. Musculoskeletal: There is a lytic lesion of the superior endplate of L3 (series 7, image 70) worrisome for osseous metastatic disease. IMPRESSION: 1. Hepatomegaly with diffusely heterogeneous  appearance of the hepatic parenchyma. There are two discrete lesions in the right hepatic lobe, as above. Findings are worrisome for diffuse metastatic disease. Recommend further evaluation with a contrast enhanced MRI. 2. There is a new hypodense lesion in the posterior aspect of the spleen measuring up to 2.0 cm. This is concerning for metastatic disease. 3. There is a lytic lesion of the superior endplate of L3 worrisome for osseous metastatic disease. Recommend futher evaluation with a contrast enhanaced lumbar spine MRI. 4. There is diverticulosis with persistent long segment wall thickening of the sigmoid colon. There is no evidence of surrounding inflammatory fat stranding. Findings may represent sequela of chronic diverticulitis. However, given the long segment wall thickening, recommend correlation with colonoscopy to exclude underlying malignancy. Aortic Atherosclerosis (ICD10-I70.0). Electronically Signed   By: Marin Roberts M.D.   On: 05/16/2022 08:14   CT Angio Chest Pulmonary Embolism (PE) W or WO Contrast  Result Date: 05/16/2022 CLINICAL DATA:  Pulmonary embolism suspected, high probability. Shortness of breath which is increased for 1 week EXAM: CT ANGIOGRAPHY CHEST WITH CONTRAST TECHNIQUE: Multidetector CT imaging of the chest was performed using the standard protocol during bolus administration of intravenous contrast. Multiplanar CT image reconstructions and MIPs were obtained to evaluate the vascular anatomy. RADIATION DOSE REDUCTION: This exam was performed according to the departmental dose-optimization program which includes automated exposure control, adjustment of the mA and/or kV according to patient size and/or use of iterative reconstruction technique. CONTRAST:  50mL OMNIPAQUE IOHEXOL 350 MG/ML SOLN COMPARISON:  03/30/2017 FINDINGS: Cardiovascular: Satisfactory opacification of the pulmonary arteries to the segmental level. No evidence of pulmonary embolism. Extrinsic narrowing of  medial basal segmental pulmonary arteries on the right due to mass. Normal heart size. No pericardial effusion. Mediastinum/Nodes: Subcarinal, right hilar, and right paratracheal lymphadenopathy. A subcarinal node measures 2.7 cm in diameter. Lungs/Pleura: Lobulated mass in the right lower lobe, peribronchovascular and measuring at least 4 cm in length with probable postobstructive dilatation of associated distal bronchi. Generalized airway thickening. Upper Abdomen: Cirrhotic appearance of the liver since prior with large caudate lobe and diffuse surface lobulation. There are ill-defined lesions in the central liver measuring 3.5 cm and in the right lower lobe measuring 2.4 cm. Musculoskeletal: No acute or aggressive finding Review of the MIP images confirms the above findings. Prelim sent in epic chat. IMPRESSION: 1. Right lower lobe mass with subcarinal and ipsilateral mediastinal lymphadenopathy, most consistent with metastatic bronchogenic carcinoma. 2. Two ill-defined lesions within the cirrhotic liver which could be primary or metastatic. 3. Negative for pulmonary embolism. Electronically Signed   By: Jorje Guild M.D.   On: 05/16/2022 07:23   DG Chest 2 View  Result Date: 05/16/2022 CLINICAL DATA:  62 year old male with history of shortness of breath. EXAM: CHEST - 2 VIEW COMPARISON:  Chest x-ray 03/30/2017. FINDINGS: Lung volumes are normal. No consolidative airspace disease. No pleural effusions. No pneumothorax. No pulmonary nodule or mass noted. Pulmonary vasculature and the cardiomediastinal silhouette are within normal limits. IMPRESSION: No radiographic evidence of acute cardiopulmonary disease. Electronically Signed   By: Vinnie Langton M.D.   On: 05/16/2022 05:48    Labs:  Recent Labs    05/16/22 0517 05/17/22 0042  WBC 15.6* 14.5*  HGB 13.5 14.1  HCT 39.1 40.0  PLT 280 270   Recent Labs    05/16/22 0517 05/17/22 0042  NA 127* 130*  K 4.0 3.4*  CL 90* 88*  CO2 25 30   GLUCOSE 141* 134*  BUN 11 14  CREATININE 0.71 0.95  CALCIUM 8.0* 8.0*   Recent Labs    05/17/22 0042  PROT 6.0*  ALBUMIN 3.3*  AST 136*  ALT 313*  ALKPHOS 433*  BILITOT 5.5*   No results for input(s): "HEPBSAG", "HCVAB", "HEPAIGM", "HEPBIGM" in the last 72 hours. Recent Labs    05/16/22 0517  LABPROT 13.0  INR 1.0    Past Medical History:  Diagnosis Date   Acute bronchitis 03/30/2017   Acute maxillary sinusitis 07/25/2017   Acute otitis media, right 05/05/2016   Acute respiratory failure with hypoxia (HCC) 03/30/2017   CAP (community acquired pneumonia) 03/31/2017   COPD (chronic obstructive pulmonary disease) ([place])    Diarrhea 10/16/2017   Diverticulitis    Ear build-up, left 08/10/2017   Hypertension    Left otitis media 07/17/2017   Pharyngitis 05/05/2016   Renal artery stenosis (HCC)    1 to 59% bilateral by Dopplers 12/2020   Sigmoid diverticulitis 02/11/2018    Past Surgical History:  Procedure Laterality Date   HERNIA REPAIR     HERNIA REPAIR  1966    Family History  Problem Relation Age of Onset   Lymphoma Father    Cancer Mother        breast   Colon cancer Neg Hx    Rectal cancer Neg Hx    Stomach cancer Neg Hx     Prior to Admission medications   Medication Sig Start Date End Date Taking? Authorizing Provider  amLODipine (NORVASC) 10 MG tablet TAKE  1 TABLET BY MOUTH DAILY Patient taking differently: Take 10 mg by mouth at bedtime. 04/12/22  Yes Sueanne Margarita, MD  aspirin EC 81 MG tablet Take 1 tablet (81 mg total) by mouth daily. Swallow whole. 05/31/21  Yes Belva Crome, MD  famotidine-calcium carbonate-magnesium hydroxide (PEPCID COMPLETE) 10-800-165 MG chewable tablet Chew 1 tablet by mouth daily as needed (indigestion, heartburn).   Yes [provider]  ibuprofen (ADVIL) 200 MG tablet Take 600 mg by mouth daily as needed for headache, mild pain or moderate pain.   Yes [provider]  metoprolol succinate (TOPROL-XL)  100 MG 24 hr tablet Take 1 tablet (100 mg total) by mouth daily. Patient taking differently: Take 100 mg by mouth at bedtime. 04/06/22  Yes Turner, Eber Hong, MD  nitroGLYCERIN (NITROSTAT) 0.4 MG SL tablet Place 1 tablet (0.4 mg total) under the tongue every 5 (five) minutes as needed for chest pain. 05/31/21 07/02/22 Yes Belva Crome, MD  omeprazole (PRILOSEC OTC) 20 MG tablet Take 20 mg by mouth daily as needed (indigestion, heartburn).   Yes [provider]  rosuvastatin (CRESTOR) 40 MG tablet Take 1 tablet (40 mg total) by mouth daily. Patient taking differently: Take 40 mg by mouth at bedtime. 12/31/21  Yes Turner, Eber Hong, MD  hydrochlorothiazide (MICROZIDE) 12.5 MG capsule Take 1 capsule (12.5 mg total) by mouth daily. Patient not taking: Reported on 05/16/2022 02/09/21   Sueanne Margarita, MD  metoprolol tartrate (LOPRESSOR) 50 MG tablet Take one tablet by mouth 2 hours prior to CT Patient not taking: Reported on 05/16/2022 05/31/21   Belva Crome, MD    Current Facility-Administered Medications  Medication Dose Route Frequency Provider Last Rate Last Admin   acetaminophen (TYLENOL) tablet 650 mg  650 mg Oral Q6H PRN Reubin Milan, MD       Or   acetaminophen (TYLENOL) suppository 650 mg  650 mg Rectal Q6H PRN Reubin Milan, MD       albuterol (VENTOLIN HFA) 108 (90 Base) MCG/ACT inhaler 2 puff  2 puff Inhalation Q2H PRN Orpah Greek, MD   2 puff at 05/16/22 0517   amLODipine (NORVASC) tablet 10 mg  10 mg Oral Daily Candee Furbish, MD   10 mg at 05/16/22 1327   chlorhexidine (PERIDEX) 0.12 % solution            furosemide (LASIX) injection 20 mg  20 mg Intravenous BID Reubin Milan, MD   20 mg at 05/16/22 1948   melatonin tablet 5 mg  5 mg Oral QHS PRN Kathryne Eriksson, NP   5 mg at 05/16/22 2322   metoprolol tartrate (LOPRESSOR) tablet 50 mg  50 mg Oral BID Candee Furbish, MD   50 mg at 05/16/22 2128   ondansetron (ZOFRAN) tablet 4 mg  4 mg Oral Q6H PRN  Reubin Milan, MD       Or   ondansetron [facility]) injection 4 mg  4 mg Intravenous Q6H PRN Reubin Milan, MD       pantoprazole (PROTONIX) EC tablet 40 mg  40 mg Oral Daily Reubin Milan, MD   40 mg at 05/16/22 1331   potassium chloride 10 mEq in 100 mL IVPB  10 mEq Intravenous Q1 Hr x 4 Wilson Singer I, [place]        Allergies as of 05/16/2022   (No Known Allergies)    Social History   Socioeconomic History   Marital status:  Widowed    Spouse name: Not on file   Number of children: 1   Years of education: Not on file   Highest education level: Not on file  Occupational History   Not on file  Tobacco Use   Smoking status: Former    Packs/day: 1.00    Types: Cigarettes    Quit date: 03/30/2017    Years since quitting: 5.1   Smokeless tobacco: Never  Vaping Use   Vaping Use: Never used  Substance and Sexual Activity   Alcohol use: Not on file    Comment: 7 cans of beer a week.   Drug use: Not Currently    Frequency: 2.0 times per week    Types: Marijuana   Sexual activity: Yes    Birth control/protection: None  Other Topics Concern   Not on file  Social History Narrative   Not on file   Social Determinants of Health   Financial Resource Strain: Not on file  Food Insecurity: No Food Insecurity (05/16/2022)   Hunger Vital Sign    Worried About Running Out of Food in the Last Year: Never true    Ran Out of Food in the Last Year: Never true  Transportation Needs: No Transportation Needs (05/16/2022)   PRAPARE - Hydrologist (Medical): No    Lack of Transportation (Non-Medical): No  Physical Activity: Not on file  Stress: Not on file  Social Connections: Not on file  Intimate Partner Violence: Not At Risk (05/16/2022)   Humiliation, Afraid, Rape, and Kick questionnaire    Fear of Current or Ex-Partner: No    Emotionally Abused: No    Physically Abused: No    Sexually Abused: No    Review of Systems: All systems  reviewed and negative except where noted in HPI.  Physical Exam: Vital signs in last 24 hours: Temp:  [97.7 F (36.5 C)-98.3 F (36.8 C)] 98.3 F (36.8 C) (02/20 1015) Pulse Rate:  [63-87] 80 (02/20 1015) Resp:  [15-22] 18 (02/20 1015) BP: (120-162)/(76-103) 125/81 (02/20 1015) SpO2:  [92 %-99 %] 92 % (02/20 1015) Weight:  [90.5 kg] 90.5 kg (02/20 0836) Last BM Date : 05/16/22  General:  Alert male in NAD Psych:  Pleasant, cooperative. Normal mood and affect Eyes: Pupils equal Ears:  Normal auditory acuity Nose: No deformity, discharge or lesions Neck:  Supple, no masses felt Lungs:  Clear to auscultation.  Heart:  Regular rate, regular rhythm.  Abdomen:  Soft, protuberant, nontender, active bowel sounds, no masses felt, small umbilical hernia present.  Rectal :  Deferred Msk: Symmetrical without gross deformities.  Neurologic:  Alert, oriented, grossly normal neurologically Extremities : No edema Skin:  Intact without significant lesions.    Intake/Output from previous day: 02/19 0701 - 02/20 0700 In: 240 [P.O.:240] Out: 700 [Urine:700] Intake/Output this shift:  Total I/O In: 500 [I.V.:500] Out: 15 [Blood:15]    Principal Problem:   Right lower lobe lung mass Active Problems:   Hypertension   OSA (obstructive sleep apnea)   Hyperlipidemia   Prediabetes   Metastases to the liver ([place])   Obstructive jaundice   Metastasis to bone ([place])   Ascites, malignant    Matthew Savoy, NP-C @  05/17/2022, 11:02 AM

## 2022-05-17 NOTE — Op Note (Signed)
Video Bronchoscopy with Endobronchial Ultrasound Procedure Note  Date of Operation: 05/17/2022  Pre-op Diagnosis: lung mass, adenopathy   Post-op Diagnosis: Lung multiple adenopathy  Surgeon: Garner Nash, DO  Assistants: None   Anesthesia: General endotracheal anesthesia  Operation: Flexible video fiberoptic bronchoscopy with endobronchial ultrasound and biopsies.  Estimated Blood Loss: Minimal  Complications: None   Indications and History: Matthew [name] is a 62 y.o. male with lung mass, adenopathy.  The risks, benefits, complications, treatment options and expected outcomes were discussed with the patient.  The possibilities of pneumothorax, pneumonia, reaction to medication, pulmonary aspiration, perforation of a viscus, bleeding, failure to diagnose a condition and creating a complication requiring transfusion or operation were discussed with the patient who freely signed the consent.    Description of Procedure: The patient was examined in the preoperative area and history and data from the preprocedure consultation were reviewed. It was deemed appropriate to proceed.  The patient was taken to [facility] endoscopy room 3, identified as [name] and the procedure verified as Flexible Video Fiberoptic Bronchoscopy.  A Time Out was held and the above information confirmed. After being taken to the operating room general anesthesia was initiated and the patient  was orally intubated. The video fiberoptic bronchoscope was introduced via the endotracheal tube and a general inspection was performed which showed normal right and left lung anatomy, no evidence of endobronchial lesion. The standard scope was then withdrawn and the endobronchial ultrasound was used to identify and characterize the peritracheal, hilar and bronchial lymph nodes. Inspection showed enlarged subcarinal node. Using real-time ultrasound guidance Wang needle biopsies were take from [employer] 7 nodes and  were sent for cytology. The patient tolerated the procedure well without apparent complications. There was no significant blood loss. The bronchoscope was withdrawn. Anesthesia was reversed and the patient was taken to the PACU for recovery.   Samples: 1. Wang needle biopsies from station 7 node  Plans:  The patient will be discharged from the PACU to home when recovered from anesthesia. We will review the cytology, pathology results with the patient when they become available. Outpatient followup will be with Garner Nash, [place], DO [facility] 05/17/2022 10:00 AM

## 2022-05-17 NOTE — Interval H&P Note (Signed)
History and Physical Interval Note:  05/17/2022 8:57 AM  Matthew [name]  has presented today for surgery, with the diagnosis of lung nodule.  The various methods of treatment have been discussed with the patient and family. After consideration of risks, benefits and other options for treatment, the patient has consented to  Procedure(s): [place] (N/A) as a surgical intervention.  The patient's history has been reviewed, patient examined, no change in status, stable for surgery.  I have reviewed the patient's chart and labs.  Questions were answered to the patient's satisfaction.     [place]

## 2022-05-17 NOTE — Anesthesia Procedure Notes (Signed)
Procedure Name: Intubation Date/Time: 05/17/2022 9:19 AM  Performed by: Heide Scales, CRNAPre-anesthesia Checklist: Patient identified, Emergency Drugs available, Suction available and Patient being monitored Patient Re-evaluated:Patient Re-evaluated prior to induction Oxygen Delivery Method: Circle system utilized Preoxygenation: Pre-oxygenation with 100% oxygen Induction Type: IV induction Ventilation: Mask ventilation without difficulty and Oral airway inserted - appropriate to patient size Laryngoscope Size: Mac and 4 Grade View: Grade II Tube type: Oral Tube size: 8.5 mm Number of attempts: 1 Airway Equipment and Method: Stylet and Oral airway Placement Confirmation: ETT inserted through vocal cords under direct vision, positive ETCO2 and breath sounds checked- equal and bilateral Secured at: 23 cm Tube secured with: Tape Dental Injury: Teeth and Oropharynx as per pre-operative assessment

## 2022-05-17 NOTE — Discharge Instructions (Signed)

## 2022-05-17 NOTE — Progress Notes (Addendum)
PROGRESS NOTE    Matthew [name]  KVQ:[id number] DOB: 1961-03-03 DOA: 05/16/2022 PCP: [name], MD    Brief Narrative:   Matthew [name] is a 62 y.o. male with past medical history significant for HTN, HLD, COPD, history of OSA not on CPAP, GERD, Hx: Polyp with tubular adenoma, previous history of tobacco abuse (quit 5 yrs prior) who presents to [facility] ED on 2/19 from home with complaint of progressive shortness of breath, abdominal distention, 10 pound weight gain with associated dark urine over the past week.  He discussed his symptoms with his daughter who is an ICU nurse in [place] who recommended evaluation in the ED.  Patient denied fever, no chills, no chest pain, no palpitations, no lower extremity edema, no nausea/vomiting/diarrhea, no melena or hematochezia, no urinary complaints or visual disturbance.  In the ED, temperature 97.6 F, HR 74, RR 20, BP 166/94, SpO2 95% on room air.  WBC 15.6, hemoglobin 13.5, platelets 280.  Sodium 127, potassium 4.0, chloride 90, CO2 25, glucose 141, BUN 11, creatinine 0.71.  Lipase 62.  AST 138, ALT 310, total bilirubin 4.9.  BNP 319.6.  High sensitive troponin 15.  Lactic acid 1.5.  Urinalysis unrevealing.  CT angiogram chest negative for pulmonary embolism but notable for right lower lobe mass with subcarinal and ipsilateral mediastinal lymphadenopathy consistent with metastatic bronchogenic carcinoma, 2 ill-defined lesions within the cirrhotic liver suspicious for metastasis.  CT abdomen/pelvis with contrast with hepatomegaly, diffuse heterogeneous appearance of the hepatic parenchyma, due 2 discrete lesions right hepatic lobe concerning for metastatic disease, hypodense lesion posterior aspect spleen concerning for metastatic disease, lytic lesion L3 consistent with metastatic disease, diverticulosis with persistent long segment wall thickening of the sigmoid colon without evidence of inflammatory fat stranding could be related to chronic  diverticulitis versus underlying malignancy.  Pulmonology was consulted.  TRH consulted for admission for further evaluation management of new findings of lung mass concerning for malignancy with likely metastatic sites within the spleen, liver and bone.  Assessment & Plan:   Right lung mass with liver, spleen, bone lesions concerning for metastatic lung cancer Patient presenting to ED with progressive shortness of breath over the last week.  Patient was afebrile and oxygenating well on room air on arrival.  CT angiogram chest negative for pulmonary embolism but notable for right lower lobe mass with lymphadenopathy concerning for bronchogenic carcinoma with metastasis.  CT abdomen/pelvis with lesions noted to liver, spleen and bone.  Pulmonology was consulted and patient underwent bronchoscopy/EBUS with biopsy by Dr. Valeta Harms on 2/20. -- PCCM, medical oncology following, appreciate assistance -- MR brain with and without contrast: Pending for staging -- Biopsy/cytology pending -- Outpatient follow-up with pulmonology, medical oncology following biopsy results  Transaminitis with elevated bilirubin AST 138, ALT 310, total bilirubin 4.9, alkaline phosphatase 406 on admission.  Suspect etiology related to malignancy versus cirrhosis.  CT abdomen/pelvis with gallbladder normal appearance, no evidence of intra or extrahepatic biliary ductal dilation, hepatomegaly with diffusely heterogeneous appearance of the hepatic parenchyma with 2 discrete lesions of the right hepatic lobe. --GI consulted --AST 138>136 --ALT 310>313 --Tbili 4.9>5.5 --AP 643>329 --Furosemide 20 mg IV q12h --LFTs daily -- MRCP pending  Hyponatremia Sodium 127 on admission.  Serum osmolality 280, urine osmolality 738, urine sodium 107.  TSH 0.455.  Cortisol 44.4. --Na 127>130  Hypokalemia Potassium 3.4, will replete.  Sigmoid wall thickening CT abdomen/pelvis with long segment of wall thickening of the sigmoid colon.  GI  believes this is more likely related  to history of diverticulitis rather than malignancy.  Essential hypertension -- Amlodipine 10 mg p.o. daily -- Metoprolol tartrate 50 mg p.o. twice daily  Hyperlipidemia On Crestor 40 mg p.o. daily at home. -- Hold statin in the setting of transaminitis  Hx tobacco abuse Quit 5 years prior.  Continue to encourage abstinence/cessation   DVT prophylaxis: SCDs Start: 05/16/22 0933    Code Status: Full Code Family Communication: Updated family present at bedside this morning  Disposition Plan:  Level of care: Telemetry Medical Status is: Observation The patient remains OBS appropriate and will d/c before 2 midnights.    Consultants:  [facility] gastroenterology Medical oncology  Procedures:  Bronchoscopy/EBUS with biopsy, Dr. Valeta Harms 2/20  Antimicrobials:  None   Subjective: Patient seen examined bedside, resting comfortably.  Lying in bed.  Eating lunch.  Family present.  Discussed findings on imaging concerning for metastatic lung cancer.  Awaiting GI and oncology input.  Reports edema and shortness of breath much improved with IV Lasix.  No other specific complaints or concerns at this time.  Denies headache, no visual changes, no chest pain, no palpitations, no shortness of breath, no abdominal pain, no focal weakness, no fever/chills/night sweats, no nausea/vomiting/diarrhea, no cough/congestion, no paresthesias.  No acute events overnight per nursing staff.  Objective: Vitals:   05/17/22 0836 05/17/22 0954 05/17/22 1000 05/17/22 1015  BP: (!) 156/92 (!) 143/81 124/81 125/81  Pulse: 74 83 87 80  Resp: 18 15 16 18   Temp: 97.8 F (36.6 C) 98.3 F (36.8 C)  98.3 F (36.8 C)  TempSrc: Oral     SpO2: 94% 99% 92% 92%  Weight: 90.5 kg     Height: 5\' 5"  (1.651 m)       Intake/Output Summary (Last 24 hours) at 05/17/2022 1127 Last data filed at 05/17/2022 0951 Gross per 24 hour  Intake 500 ml  Output 715 ml  Net -215 ml   Filed  Weights   05/17/22 0836  Weight: 90.5 kg    Examination:  Physical Exam: GEN: NAD, alert and oriented x 3, wd/wn HEENT: NCAT, PERRL, EOMI, sclera clear, MMM PULM: CTAB w/o wheezes/crackles, normal respiratory effort CV: RRR w/o M/G/R GI: abd soft, nontender, mild distention, NABS, no R/G/M MSK: Trace peripheral edema, muscle strength globally intact 5/5 bilateral upper/lower extremities NEURO: CN II-XII intact, no focal deficits, sensation to light touch intact PSYCH: normal mood/affect Integumentary: Jaundice, no concerning rashes/lesions/wounds otherwise noted to exposed skin surfaces.      Data Reviewed: I have personally reviewed following labs and imaging studies  CBC: Recent Labs  Lab 05/16/22 0517 05/17/22 0042  WBC 15.6* 14.5*  NEUTROABS 13.3*  --   HGB 13.5 14.1  HCT 39.1 40.0  MCV 89.9 88.1  PLT 280 366   Basic Metabolic Panel: Recent Labs  Lab 05/16/22 0517 05/17/22 0042  NA 127* 130*  K 4.0 3.4*  CL 90* 88*  CO2 25 30  GLUCOSE 141* 134*  BUN 11 14  CREATININE 0.71 0.95  CALCIUM 8.0* 8.0*   GFR: Estimated Creatinine Clearance: 84.4 mL/min (by C-G formula based on SCr of 0.95 mg/dL). Liver Function Tests: Recent Labs  Lab 05/16/22 0517 05/17/22 0042  AST 138* 136*  ALT 310* 313*  ALKPHOS 406* 433*  BILITOT 4.9* 5.5*  PROT 5.9* 6.0*  ALBUMIN 3.3* 3.3*   Recent Labs  Lab 05/16/22 0517  LIPASE 62*   No results for input(s): "AMMONIA" in the last 168 hours. Coagulation Profile: Recent Labs  Lab  05/16/22 0517  INR 1.0   Cardiac Enzymes: No results for input(s): "CKTOTAL", "CKMB", "CKMBINDEX", "TROPONINI" in the last 168 hours. BNP (last 3 results) No results for input(s): "PROBNP" in the last 8760 hours. HbA1C: Recent Labs    05/17/22 0039  HGBA1C 6.1*   CBG: Recent Labs  Lab 05/16/22 2117  GLUCAP 158*   Lipid Profile: No results for input(s): "CHOL", "HDL", "LDLCALC", "TRIG", "CHOLHDL", "LDLDIRECT" in the last 72  hours. Thyroid Function Tests: Recent Labs    05/16/22 0517  TSH 0.455   Anemia Panel: No results for input(s): "VITAMINB12", "FOLATE", "FERRITIN", "TIBC", "IRON", "RETICCTPCT" in the last 72 hours. Sepsis Labs: Recent Labs  Lab 05/16/22 0535  LATICACIDVEN 1.5    Recent Results (from the past 240 hour(s))  SARS Coronavirus 2 by RT PCR (hospital order, performed in [facility] hospital lab) *cepheid single result test* Anterior Nasal Swab     Status: None   Collection Time: 05/16/22  9:41 AM   Specimen: Anterior Nasal Swab  Result Value Ref Range Status   SARS Coronavirus 2 by RT PCR NEGATIVE NEGATIVE Final    Comment: Performed at [facility], [place] [address] [place], [place] [address]         Radiology Studies: CT ABDOMEN PELVIS W CONTRAST  Result Date: 05/16/2022 CLINICAL DATA:  Abdominal pain EXAM: CT ABDOMEN AND PELVIS WITH CONTRAST TECHNIQUE: Multidetector CT imaging of the abdomen and pelvis was performed using the standard protocol following bolus administration of intravenous contrast. RADIATION DOSE REDUCTION: This exam was performed according to the departmental dose-optimization program which includes automated exposure control, adjustment of the mA and/or kV according to patient size and/or use of iterative reconstruction technique. CONTRAST:  21mL OMNIPAQUE IOHEXOL 350 MG/ML SOLN COMPARISON:  CT AP 02/11/18 FINDINGS: Lower chest: Please see separately dictated CTA chest for thoracic findings including a masslike lesion in the right lower lobe. Hepatobiliary: There hepatomegaly with diffusely heterogeneous appearance of the hepatic parenchyma. There is a more focal discrete lesion in the right hepatic lobe measuring up to 3.4 x 4.0 cm. There is an additional discrete lesion in the inferior right hepatic lobe measuring approximately 2.7 x 2.3 cm (series 3, image 30). Findings are worrisome for diffuse metastatic disease. Portal vein is contrast opacified. Gallbladder  is normal in appearance. No evidence of intra or extrahepatic biliary ductal dilatation. Pancreas: No evidence of pancreatic ductal dilatation. No evidence of peripancreatic fat stranding. Spleen: Spleen is normal in size. There is a new hypodense lesion in the posterior aspect of the spleen measuring 1.8 x 2.0 cm (series 3, image 16). Adrenals/Urinary Tract: Bilateral adrenal glands are normal in appearance without evidence of focal lesion. Bilateral kidneys are normal in size and enhance homogeneously. No evidence of hydronephrosis or nephrolithiasis. Stomach/Bowel: The stomach, small bowel, large bowel are normal in caliber without evidence of obstruction. There is diverticulosis with persistent long segment wall thickening sigmoid colon (series 3, image 60). There is no evidence of surrounding inflammatory fat stranding. Vascular/Lymphatic: No significant vascular findings are present. No enlarged abdominal or pelvic lymph nodes. Aortic atherosclerotic calcification. Reproductive: Prostate is unremarkable. Other: No abdominal wall hernia or abnormality. No abdominopelvic ascites. Musculoskeletal: There is a lytic lesion of the superior endplate of L3 (series 7, image 70) worrisome for osseous metastatic disease. IMPRESSION: 1. Hepatomegaly with diffusely heterogeneous appearance of the hepatic parenchyma. There are two discrete lesions in the right hepatic lobe, as above. Findings are worrisome for diffuse metastatic disease. Recommend further  evaluation with a contrast enhanced MRI. 2. There is a new hypodense lesion in the posterior aspect of the spleen measuring up to 2.0 cm. This is concerning for metastatic disease. 3. There is a lytic lesion of the superior endplate of L3 worrisome for osseous metastatic disease. Recommend futher evaluation with a contrast enhanaced lumbar spine MRI. 4. There is diverticulosis with persistent long segment wall thickening of the sigmoid colon. There is no evidence of  surrounding inflammatory fat stranding. Findings may represent sequela of chronic diverticulitis. However, given the long segment wall thickening, recommend correlation with colonoscopy to exclude underlying malignancy. Aortic Atherosclerosis (ICD10-I70.0). Electronically Signed   By: Marin Roberts M.D.   On: 05/16/2022 08:14   CT Angio Chest Pulmonary Embolism (PE) W or WO Contrast  Result Date: 05/16/2022 CLINICAL DATA:  Pulmonary embolism suspected, high probability. Shortness of breath which is increased for 1 week EXAM: CT ANGIOGRAPHY CHEST WITH CONTRAST TECHNIQUE: Multidetector CT imaging of the chest was performed using the standard protocol during bolus administration of intravenous contrast. Multiplanar CT image reconstructions and MIPs were obtained to evaluate the vascular anatomy. RADIATION DOSE REDUCTION: This exam was performed according to the departmental dose-optimization program which includes automated exposure control, adjustment of the mA and/or kV according to patient size and/or use of iterative reconstruction technique. CONTRAST:  49mL OMNIPAQUE IOHEXOL 350 MG/ML SOLN COMPARISON:  03/30/2017 FINDINGS: Cardiovascular: Satisfactory opacification of the pulmonary arteries to the segmental level. No evidence of pulmonary embolism. Extrinsic narrowing of medial basal segmental pulmonary arteries on the right due to mass. Normal heart size. No pericardial effusion. Mediastinum/Nodes: Subcarinal, right hilar, and right paratracheal lymphadenopathy. A subcarinal node measures 2.7 cm in diameter. Lungs/Pleura: Lobulated mass in the right lower lobe, peribronchovascular and measuring at least 4 cm in length with probable postobstructive dilatation of associated distal bronchi. Generalized airway thickening. Upper Abdomen: Cirrhotic appearance of the liver since prior with large caudate lobe and diffuse surface lobulation. There are ill-defined lesions in the central liver measuring 3.5 cm and in  the right lower lobe measuring 2.4 cm. Musculoskeletal: No acute or aggressive finding Review of the MIP images confirms the above findings. Prelim sent in epic chat. IMPRESSION: 1. Right lower lobe mass with subcarinal and ipsilateral mediastinal lymphadenopathy, most consistent with metastatic bronchogenic carcinoma. 2. Two ill-defined lesions within the cirrhotic liver which could be primary or metastatic. 3. Negative for pulmonary embolism. Electronically Signed   By: Jorje Guild M.D.   On: 05/16/2022 07:23   DG Chest 2 View  Result Date: 05/16/2022 CLINICAL DATA:  62 year old male with history of shortness of breath. EXAM: CHEST - 2 VIEW COMPARISON:  Chest x-ray 03/30/2017. FINDINGS: Lung volumes are normal. No consolidative airspace disease. No pleural effusions. No pneumothorax. No pulmonary nodule or mass noted. Pulmonary vasculature and the cardiomediastinal silhouette are within normal limits. IMPRESSION: No radiographic evidence of acute cardiopulmonary disease. Electronically Signed   By: Vinnie Langton M.D.   On: 05/16/2022 05:48        Scheduled Meds:  amLODipine  10 mg Oral Daily   chlorhexidine       furosemide  20 mg Intravenous BID   metoprolol tartrate  50 mg Oral BID   pantoprazole  40 mg Oral Daily   Continuous Infusions:  potassium chloride       LOS: 0 days    Time spent: 52 minutes spent on chart review, discussion with nursing staff, consultants, updating family and interview/physical exam; more than 50% of that  time was spent in counseling and/or coordination of care.    Cindie Rajagopalan J [place] DO [facility] Available via Epic secure chat 7am-7pm After these hours, please refer to coverage provider listed on [email or web address] 05/17/2022, 11:27 AM

## 2022-05-17 NOTE — Anesthesia Postprocedure Evaluation (Signed)
Anesthesia Post Note  Patient: Matthew [name]  Procedure(s) Performed: VIDEO BRONCHOSCOPY WITH ENDOBRONCHIAL ULTRASOUND FINE NEEDLE ASPIRATION (FNA) LINEAR     Patient location during evaluation: PACU Anesthesia Type: General Level of consciousness: awake and alert Pain management: pain level controlled Vital Signs Assessment: post-procedure vital signs reviewed and stable Respiratory status: spontaneous breathing, nonlabored ventilation, respiratory function stable and patient connected to nasal cannula oxygen Cardiovascular status: blood pressure returned to baseline and stable Postop Assessment: no apparent nausea or vomiting Anesthetic complications: no  No notable events documented.  Last Vitals:  Vitals:   05/17/22 0954 05/17/22 1000  BP: (!) 143/81 124/81  Pulse: 83 87  Resp: 15 16  Temp: 36.8 C   SpO2: 99% 92%    Last Pain:  Vitals:   05/17/22 0954  TempSrc:   PainSc: 0-No pain                 [name] S

## 2022-05-18 ENCOUNTER — Observation Stay (HOSPITAL_COMMUNITY): Payer: Commercial Managed Care - HMO

## 2022-05-18 ENCOUNTER — Encounter (HOSPITAL_COMMUNITY): Payer: Self-pay | Admitting: Pulmonary Disease

## 2022-05-18 ENCOUNTER — Other Ambulatory Visit: Payer: Self-pay | Admitting: Internal Medicine

## 2022-05-18 ENCOUNTER — Telehealth: Payer: Self-pay | Admitting: Physician Assistant

## 2022-05-18 ENCOUNTER — Telehealth: Payer: Self-pay | Admitting: Medical Oncology

## 2022-05-18 DIAGNOSIS — C3490 Malignant neoplasm of unspecified part of unspecified bronchus or lung: Secondary | ICD-10-CM

## 2022-05-18 DIAGNOSIS — R918 Other nonspecific abnormal finding of lung field: Secondary | ICD-10-CM | POA: Diagnosis not present

## 2022-05-18 LAB — COMPREHENSIVE METABOLIC PANEL
ALT: 327 U/L — ABNORMAL HIGH (ref 0–44)
AST: 159 U/L — ABNORMAL HIGH (ref 15–41)
Albumin: 2.9 g/dL — ABNORMAL LOW (ref 3.5–5.0)
Alkaline Phosphatase: 412 U/L — ABNORMAL HIGH (ref 38–126)
Anion gap: 9 (ref 5–15)
BUN: 21 mg/dL (ref 8–23)
CO2: 36 mmol/L — ABNORMAL HIGH (ref 22–32)
Calcium: 8.2 mg/dL — ABNORMAL LOW (ref 8.9–10.3)
Chloride: 89 mmol/L — ABNORMAL LOW (ref 98–111)
Creatinine, Ser: 1.07 mg/dL (ref 0.61–1.24)
GFR, Estimated: >60 mL/min (ref >60)
Glucose, Bld: 150 mg/dL — ABNORMAL HIGH (ref 70–99)
Potassium: 3.4 mmol/L — ABNORMAL LOW (ref 3.5–5.1)
Sodium: 134 mmol/L — ABNORMAL LOW (ref 135–145)
Total Bilirubin: 5.9 mg/dL — ABNORMAL HIGH (ref 0.3–1.2)
Total Protein: 5.7 g/dL — ABNORMAL LOW (ref 6.5–8.1)

## 2022-05-18 LAB — MAGNESIUM: Magnesium: 2.7 mg/dL — ABNORMAL HIGH (ref 1.7–2.4)

## 2022-05-18 LAB — GLUCOSE, CAPILLARY: Glucose-Capillary: 160 mg/dL — ABNORMAL HIGH (ref 70–99)

## 2022-05-18 MED ORDER — TRAZODONE HCL 50 MG PO TABS: 50.0000 mg | ORAL_TABLET | Freq: Every day | ORAL | 2 refills | Status: DC

## 2022-05-18 MED ORDER — LORAZEPAM 0.5 MG PO TABS: 0.5000 mg | ORAL_TABLET | Freq: Four times a day (QID) | ORAL | Status: DC | PRN

## 2022-05-18 MED ORDER — LORAZEPAM 0.5 MG PO TABS: 0.5000 mg | ORAL_TABLET | Freq: Four times a day (QID) | ORAL | 2 refills | Status: DC | PRN

## 2022-05-18 MED ORDER — TRAZODONE HCL 50 MG PO TABS: 50.0000 mg | ORAL_TABLET | Freq: Every day | ORAL | Status: DC

## 2022-05-18 MED ORDER — FUROSEMIDE 20 MG PO TABS: 20.0000 mg | ORAL_TABLET | Freq: Every day | ORAL | 2 refills | Status: DC

## 2022-05-18 MED ORDER — GADOBUTROL 1 MMOL/ML IV SOLN
10.0000 mL | Freq: Once | INTRAVENOUS | Status: AC | PRN
  Administered 2022-05-18: 10 mL via INTRAVENOUS

## 2022-05-18 MED ORDER — POTASSIUM CHLORIDE CRYS ER 20 MEQ PO TBCR
30.0000 meq | EXTENDED_RELEASE_TABLET | ORAL | Status: DC
  Administered 2022-05-18: 30 meq via ORAL
  Filled 2022-05-18: qty 1

## 2022-05-18 MED ORDER — HYDROXYZINE HCL 10 MG PO TABS
10.0000 mg | ORAL_TABLET | Freq: Once | ORAL | Status: AC
  Administered 2022-05-18: 10 mg via ORAL
  Filled 2022-05-18: qty 1

## 2022-05-18 NOTE — Discharge Summary (Signed)
Physician Discharge Summary  Matthew [name]:[id number] DOB: 07-Jul-1960 DOA: 05/16/2022  PCP: [name], MD  Admit date: 05/16/2022 Discharge date: 05/18/2022  Admitted From: Home Disposition: Home  Recommendations for Outpatient Follow-up:  Follow up with PCP in 1-2 weeks Follow-up with pulmonology, Dr. Valeta Harms Follow-up with medical oncology, Dr. Rene Paci on Ativan as needed for anxiety, trazodone as needed for insomnia Follow-up biopsy results which were pending at time of discharge. Continue goals of care discussion outpatient given new diagnosis of likely metastatic lung cancer; appears to be aggressive with overall prognosis likely poor.  Home Health: No Equipment/Devices: None  Discharge Condition: Stable CODE STATUS: Full code Diet recommendation: Heart healthy diet  History of present illness:  Matthew [name] is a 62 y.o. male with past medical history significant for HTN, HLD, COPD, history of OSA not on CPAP, GERD, Hx: Polyp with tubular adenoma, previous history of tobacco abuse (quit 5 yrs prior) who presents to [facility] ED on 2/19 from home with complaint of progressive shortness of breath, abdominal distention, 10 pound weight gain with associated dark urine over the past week.  He discussed his symptoms with his daughter who is an ICU nurse in [place] who recommended evaluation in the ED.  Patient denied fever, no chills, no chest pain, no palpitations, no lower extremity edema, no nausea/vomiting/diarrhea, no melena or hematochezia, no urinary complaints or visual disturbance.   In the ED, temperature 97.6 F, HR 74, RR 20, BP 166/94, SpO2 95% on room air.  WBC 15.6, hemoglobin 13.5, platelets 280.  Sodium 127, potassium 4.0, chloride 90, CO2 25, glucose 141, BUN 11, creatinine 0.71.  Lipase 62.  AST 138, ALT 310, total bilirubin 4.9.  BNP 319.6.  High sensitive troponin 15.  Lactic acid 1.5.  Urinalysis unrevealing.  CT angiogram chest negative for  pulmonary embolism but notable for right lower lobe mass with subcarinal and ipsilateral mediastinal lymphadenopathy consistent with metastatic bronchogenic carcinoma, 2 ill-defined lesions within the cirrhotic liver suspicious for metastasis.  CT abdomen/pelvis with contrast with hepatomegaly, diffuse heterogeneous appearance of the hepatic parenchyma, due 2 discrete lesions right hepatic lobe concerning for metastatic disease, hypodense lesion posterior aspect spleen concerning for metastatic disease, lytic lesion L3 consistent with metastatic disease, diverticulosis with persistent long segment wall thickening of the sigmoid colon without evidence of inflammatory fat stranding could be related to chronic diverticulitis versus underlying malignancy.  Pulmonology was consulted.  TRH consulted for admission for further evaluation management of new findings of lung mass concerning for malignancy with likely metastatic sites within the spleen, liver and bone.  Hospital course:  Right lung mass with liver, spleen, bone lesions concerning for metastatic lung cancer Patient presenting to ED with progressive shortness of breath over the last week.  Patient was afebrile and oxygenating well on room air on arrival.  CT angiogram chest negative for pulmonary embolism but notable for right lower lobe mass with lymphadenopathy concerning for bronchogenic carcinoma with metastasis.  CT abdomen/pelvis with lesions noted to liver, spleen and bone.  Pulmonology was consulted and patient underwent bronchoscopy/EBUS with biopsy by Dr. Valeta Harms on 2/20.  Patient was seen by medical oncology, Dr. Earlie Server while inpatient.  MR brain with and without contrast with no concerns for metastatic disease within the brain.  MRCP with hepatomegaly, liver nearly completely replaced by innumerable confluent masses with targetoid enhancement consistent with widespread hepatic metastatic disease, widespread patchy enhancing bone lesions  throughout the lumbar vertebral bodies and bilateral pelvic girdle compatible with osseous  mets metastatic disease, enhancing 0.7 cm retroperitoneal nodule, hypoenhancing solitary splenic metastasis.  Biopsy/cytology pending at time of discharge. Outpatient follow-up with pulmonology, medical oncology following biopsy results.    Transaminitis with elevated bilirubin AST 138, ALT 310, total bilirubin 4.9, alkaline phosphatase 406 on admission. CT abdomen/pelvis with gallbladder normal appearance, no evidence of intra or extrahepatic biliary ductal dilation, hepatomegaly with diffusely heterogeneous appearance of the hepatic parenchyma with 2 discrete lesions of the right hepatic lobe.  GI was consulted and underwent MRCP as above.  GI with no further recommendations and outpatient follow-up as needed.  Etiology likely secondary to malignancy.   Hyponatremia Sodium 127 on admission.  Serum osmolality 280, urine osmolality 738, urine sodium 107.  TSH 0.455.  Cortisol 44.4.  Sodium improved to 134 on discharge.  Likely secondary to hypervolemic hyponatremia in the setting of likely mild ascites given hepatic malignancy as above.  Started on furosemide 20 mg p.o. daily.  Outpatient follow-up with PCP.   Hypokalemia Repleted during hospitalization.   Sigmoid wall thickening CT abdomen/pelvis with long segment of wall thickening of the sigmoid colon. GI believes this is more likely related to history of diverticulitis rather than malignancy.   Essential hypertension Continue amlodipine 10 mg p.o. daily, Metoprolol succinate 100 mg p.o. daily   Hyperlipidemia On Crestor 40 mg p.o. daily at home.  Will discontinue Crestor now given hepatic malignancy with elevated LFTs as above.  Anxiety Started on Ativan as needed  Insomnia Started on trazodone as needed   Hx tobacco abuse Quit 5 years prior.  Continue to encourage abstinence/cessation  Discharge Diagnoses:  Principal Problem:   Right lower  lobe lung mass Active Problems:   Hypertension   OSA (obstructive sleep apnea)   Hyperlipidemia   Prediabetes   Metastases to the liver (HCC)   Obstructive jaundice   Metastasis to bone (HCC)   Ascites, malignant    Discharge Instructions  Discharge Instructions     Ambulatory referral to Hematology / Oncology   Complete by: As directed    Dr. Julien Nordmann   Call MD for:  difficulty breathing, headache or visual disturbances   Complete by: As directed    Call MD for:  extreme fatigue   Complete by: As directed    Call MD for:  persistant dizziness or light-headedness   Complete by: As directed    Call MD for:  persistant nausea and vomiting   Complete by: As directed    Call MD for:  severe uncontrolled pain   Complete by: As directed    Call MD for:  temperature >100.4   Complete by: As directed    Diet - low sodium heart healthy   Complete by: As directed    Increase activity slowly   Complete by: As directed       Allergies as of 05/18/2022   No Known Allergies      Medication List     STOP taking these medications    hydrochlorothiazide 12.5 MG capsule Commonly known as: MICROZIDE   rosuvastatin 40 MG tablet Commonly known as: CRESTOR       TAKE these medications    amLODipine 10 MG tablet Commonly known as: NORVASC TAKE 1 TABLET BY MOUTH DAILY What changed: when to take this   aspirin EC 81 MG tablet Take 1 tablet (81 mg total) by mouth daily. Swallow whole.   famotidine-calcium carbonate-magnesium hydroxide 10-800-165 MG chewable tablet Commonly known as: PEPCID COMPLETE Chew 1 tablet by mouth daily as  needed (indigestion, heartburn).   furosemide 20 MG tablet Commonly known as: Lasix Take 1 tablet (20 mg total) by mouth daily.   ibuprofen 200 MG tablet Commonly known as: ADVIL Take 600 mg by mouth daily as needed for headache, mild pain or moderate pain.   LORazepam 0.5 MG tablet Commonly known as: ATIVAN Take 1 tablet (0.5 mg total)  by mouth every 6 (six) hours as needed for anxiety.   metoprolol succinate 100 MG 24 hr tablet Commonly known as: TOPROL-XL Take 1 tablet (100 mg total) by mouth daily. What changed: when to take this   metoprolol tartrate 50 MG tablet Commonly known as: LOPRESSOR Take one tablet by mouth 2 hours prior to CT   nitroGLYCERIN 0.4 MG SL tablet Commonly known as: NITROSTAT Place 1 tablet (0.4 mg total) under the tongue every 5 (five) minutes as needed for chest pain.   omeprazole 20 MG tablet Commonly known as: PRILOSEC OTC Take 20 mg by mouth daily as needed (indigestion, heartburn).   traZODone 50 MG tablet Commonly known as: DESYREL Take 1 tablet (50 mg total) by mouth at bedtime.        Follow-up Information     Curt Bears, MD Follow up.   Specialty: Oncology Contact information: [place] [address] [phone]         Garner Nash, DO. Schedule an appointment as soon as possible for a visit.   Specialty: Pulmonary Disease Contact information: [place] [address] [phone]         [name], MD. Schedule an appointment as soon as possible for a visit in 1 week(s).   Specialty: Internal Medicine Contact information: [place] [address] [phone]                No Known Allergies  Consultations: [facility]   Procedures/Studies: MR ABDOMEN MRCP W WO CONTAST  Result Date: 05/18/2022 CLINICAL DATA:  Inpatient. Right lower lobe lung mass with mediastinal adenopathy suspicious for primary lung malignancy on recent chest CT angiogram. Evaluate abnormal liver and spleen suspicious for metastatic disease on recent CT abdomen/pelvis. Jaundice. EXAM: MRI ABDOMEN WITHOUT AND WITH CONTRAST (INCLUDING MRCP) TECHNIQUE: Multiplanar multisequence MR imaging of the abdomen was performed both before and after the  administration of intravenous contrast. Heavily T2-weighted images of the biliary and pancreatic ducts were obtained, and three-dimensional MRCP images were rendered by post processing. CONTRAST:  96mL GADAVIST GADOBUTROL 1 MMOL/ML IV SOLN COMPARISON:  05/16/2022 chest CT angiogram and CT abdomen/pelvis. FINDINGS: Lower chest: Partially visualized irregular solid 4.2 cm right lower lobe lung mass (series 4/image 1). Partially visualized subcarinal lymphadenopathy on series 5/image 16). Hepatobiliary: Hepatomegaly. Liver is nearly completely replaced by innumerable (> than 50) confluent masses with targetoid enhancement, all new since 02/11/2018 CT. Representative 6.3 x 5.0 cm segment 3 left liver mass (series 4/image 20). Representative 4.2 x 3.8 cm segment 4A left liver mass (series 4/image 10). Representative 3.5 x 3.0 cm segment 5 right liver mass (series 4/image 21). Contracted gallbladder with no cholelithiasis. No biliary ductal dilatation. Common bile duct diameter 2 mm. No choledocholithiasis. No biliary masses, strictures or beading. Pancreas: No pancreatic mass or duct dilation.  No pancreas divisum. Spleen: Normal size spleen. Posterior splenic 2.2 x 2.2 cm mass (series 4/image 13) with heterogeneous hypoenhancement, new from 02/11/2018 CT. Adrenals/Urinary Tract: Normal adrenals. No hydronephrosis. Normal kidneys with no renal mass. Enhancing 0.7  cm solid left retroperitoneal nodule inferior to lower left renal pole (series 8/image 31). Stomach/Bowel: Normal non-distended stomach. Visualized small and large bowel is normal caliber, with no bowel wall thickening. Vascular/Lymphatic: Atherosclerotic nonaneurysmal abdominal aorta. Patent portal, splenic, hepatic and renal veins. No pathologically enlarged lymph nodes in the abdomen. Other: No abdominal ascites or focal fluid collection. Musculoskeletal: Widespread patchy enhancing bone lesions throughout lumbar vertebral bodies and visualized bilateral  pelvic girdle, for example a 2.2 cm in the medial right iliac bone (series 28/image 12) and 2.1 cm in left L3 vertebral body (series 28/image 25). IMPRESSION: 1. Hepatomegaly. Liver is nearly completely replaced by innumerable confluent masses with targetoid enhancement, all new since 02/11/2018 CT, compatible with widespread hepatic metastatic disease. 2. Widespread patchy enhancing bone lesions throughout the lumbar vertebral bodies and visualized bilateral pelvic girdle, compatible with widespread osseous metastatic disease. 3. Enhancing 0.7 cm solid left retroperitoneal nodule inferior to lower left renal pole, potentially a small retroperitoneal metastasis. 4. Hypoenhancing solitary splenic metastasis. 5. Partially visualized solid 4.2 cm right lower lobe lung mass and partially visualized subcarinal lymphadenopathy, compatible with presumed primary lung malignancy and metastatic thoracic adenopathy. 6. No biliary ductal dilatation. No cholelithiasis. Electronically Signed   By: Ilona Sorrel M.D.   On: 05/18/2022 10:07   MR 3D Recon At Scanner  Result Date: 05/18/2022 CLINICAL DATA:  Inpatient. Right lower lobe lung mass with mediastinal adenopathy suspicious for primary lung malignancy on recent chest CT angiogram. Evaluate abnormal liver and spleen suspicious for metastatic disease on recent CT abdomen/pelvis. Jaundice. EXAM: MRI ABDOMEN WITHOUT AND WITH CONTRAST (INCLUDING MRCP) TECHNIQUE: Multiplanar multisequence MR imaging of the abdomen was performed both before and after the administration of intravenous contrast. Heavily T2-weighted images of the biliary and pancreatic ducts were obtained, and three-dimensional MRCP images were rendered by post processing. CONTRAST:  10mL GADAVIST GADOBUTROL 1 MMOL/ML IV SOLN COMPARISON:  05/16/2022 chest CT angiogram and CT abdomen/pelvis. FINDINGS: Lower chest: Partially visualized irregular solid 4.2 cm right lower lobe lung mass (series 4/image 1). Partially  visualized subcarinal lymphadenopathy on series 5/image 16). Hepatobiliary: Hepatomegaly. Liver is nearly completely replaced by innumerable (> than 50) confluent masses with targetoid enhancement, all new since 02/11/2018 CT. Representative 6.3 x 5.0 cm segment 3 left liver mass (series 4/image 20). Representative 4.2 x 3.8 cm segment 4A left liver mass (series 4/image 10). Representative 3.5 x 3.0 cm segment 5 right liver mass (series 4/image 21). Contracted gallbladder with no cholelithiasis. No biliary ductal dilatation. Common bile duct diameter 2 mm. No choledocholithiasis. No biliary masses, strictures or beading. Pancreas: No pancreatic mass or duct dilation.  No pancreas divisum. Spleen: Normal size spleen. Posterior splenic 2.2 x 2.2 cm mass (series 4/image 13) with heterogeneous hypoenhancement, new from 02/11/2018 CT. Adrenals/Urinary Tract: Normal adrenals. No hydronephrosis. Normal kidneys with no renal mass. Enhancing 0.7 cm solid left retroperitoneal nodule inferior to lower left renal pole (series 8/image 31). Stomach/Bowel: Normal non-distended stomach. Visualized small and large bowel is normal caliber, with no bowel wall thickening. Vascular/Lymphatic: Atherosclerotic nonaneurysmal abdominal aorta. Patent portal, splenic, hepatic and renal veins. No pathologically enlarged lymph nodes in the abdomen. Other: No abdominal ascites or focal fluid collection. Musculoskeletal: Widespread patchy enhancing bone lesions throughout lumbar vertebral bodies and visualized bilateral pelvic girdle, for example a 2.2 cm in the medial right iliac bone (series 28/image 12) and 2.1 cm in left L3 vertebral body (series 28/image 25). IMPRESSION: 1. Hepatomegaly. Liver is nearly completely replaced by innumerable confluent masses with  targetoid enhancement, all new since 02/11/2018 CT, compatible with widespread hepatic metastatic disease. 2. Widespread patchy enhancing bone lesions throughout the lumbar vertebral  bodies and visualized bilateral pelvic girdle, compatible with widespread osseous metastatic disease. 3. Enhancing 0.7 cm solid left retroperitoneal nodule inferior to lower left renal pole, potentially a small retroperitoneal metastasis. 4. Hypoenhancing solitary splenic metastasis. 5. Partially visualized solid 4.2 cm right lower lobe lung mass and partially visualized subcarinal lymphadenopathy, compatible with presumed primary lung malignancy and metastatic thoracic adenopathy. 6. No biliary ductal dilatation. No cholelithiasis. Electronically Signed   By: Ilona Sorrel M.D.   On: 05/18/2022 10:07   MR BRAIN W WO CONTRAST  Result Date: 05/17/2022 CLINICAL DATA:  Non-small cell lung cancer (NSCLC), staging EXAM: MRI HEAD WITHOUT AND WITH CONTRAST TECHNIQUE: Multiplanar, multiecho pulse sequences of the brain and surrounding structures were obtained without and with intravenous contrast. CONTRAST:  59mL GADAVIST GADOBUTROL 1 MMOL/ML IV SOLN COMPARISON:  None Available. FINDINGS: Brain: No acute infarction, hemorrhage, hydrocephalus, extra-axial collection or mass lesion. No pathologic enhancement. Vascular: Major arterial flow voids are maintained at the skull base. Skull and upper cervical spine: Normal marrow signal. Sinuses/Orbits: Clear sinuses.  No acute orbital findings. IMPRESSION: No evidence of acute intracranial abnormality or metastatic disease. Electronically Signed   By: Margaretha Sheffield M.D.   On: 05/17/2022 14:04   CT ABDOMEN PELVIS W CONTRAST  Result Date: 05/16/2022 CLINICAL DATA:  Abdominal pain EXAM: CT ABDOMEN AND PELVIS WITH CONTRAST TECHNIQUE: Multidetector CT imaging of the abdomen and pelvis was performed using the standard protocol following bolus administration of intravenous contrast. RADIATION DOSE REDUCTION: This exam was performed according to the departmental dose-optimization program which includes automated exposure control, adjustment of the mA and/or kV according to  patient size and/or use of iterative reconstruction technique. CONTRAST:  74mL OMNIPAQUE IOHEXOL 350 MG/ML SOLN COMPARISON:  CT AP 02/11/18 FINDINGS: Lower chest: Please see separately dictated CTA chest for thoracic findings including a masslike lesion in the right lower lobe. Hepatobiliary: There hepatomegaly with diffusely heterogeneous appearance of the hepatic parenchyma. There is a more focal discrete lesion in the right hepatic lobe measuring up to 3.4 x 4.0 cm. There is an additional discrete lesion in the inferior right hepatic lobe measuring approximately 2.7 x 2.3 cm (series 3, image 30). Findings are worrisome for diffuse metastatic disease. Portal vein is contrast opacified. Gallbladder is normal in appearance. No evidence of intra or extrahepatic biliary ductal dilatation. Pancreas: No evidence of pancreatic ductal dilatation. No evidence of peripancreatic fat stranding. Spleen: Spleen is normal in size. There is a new hypodense lesion in the posterior aspect of the spleen measuring 1.8 x 2.0 cm (series 3, image 16). Adrenals/Urinary Tract: Bilateral adrenal glands are normal in appearance without evidence of focal lesion. Bilateral kidneys are normal in size and enhance homogeneously. No evidence of hydronephrosis or nephrolithiasis. Stomach/Bowel: The stomach, small bowel, large bowel are normal in caliber without evidence of obstruction. There is diverticulosis with persistent long segment wall thickening sigmoid colon (series 3, image 60). There is no evidence of surrounding inflammatory fat stranding. Vascular/Lymphatic: No significant vascular findings are present. No enlarged abdominal or pelvic lymph nodes. Aortic atherosclerotic calcification. Reproductive: Prostate is unremarkable. Other: No abdominal wall hernia or abnormality. No abdominopelvic ascites. Musculoskeletal: There is a lytic lesion of the superior endplate of L3 (series 7, image 70) worrisome for osseous metastatic disease.  IMPRESSION: 1. Hepatomegaly with diffusely heterogeneous appearance of the hepatic parenchyma. There are two discrete lesions  in the right hepatic lobe, as above. Findings are worrisome for diffuse metastatic disease. Recommend further evaluation with a contrast enhanced MRI. 2. There is a new hypodense lesion in the posterior aspect of the spleen measuring up to 2.0 cm. This is concerning for metastatic disease. 3. There is a lytic lesion of the superior endplate of L3 worrisome for osseous metastatic disease. Recommend futher evaluation with a contrast enhanaced lumbar spine MRI. 4. There is diverticulosis with persistent long segment wall thickening of the sigmoid colon. There is no evidence of surrounding inflammatory fat stranding. Findings may represent sequela of chronic diverticulitis. However, given the long segment wall thickening, recommend correlation with colonoscopy to exclude underlying malignancy. Aortic Atherosclerosis (ICD10-I70.0). Electronically Signed   By: Marin Roberts M.D.   On: 05/16/2022 08:14   CT Angio Chest Pulmonary Embolism (PE) W or WO Contrast  Result Date: 05/16/2022 CLINICAL DATA:  Pulmonary embolism suspected, high probability. Shortness of breath which is increased for 1 week EXAM: CT ANGIOGRAPHY CHEST WITH CONTRAST TECHNIQUE: Multidetector CT imaging of the chest was performed using the standard protocol during bolus administration of intravenous contrast. Multiplanar CT image reconstructions and MIPs were obtained to evaluate the vascular anatomy. RADIATION DOSE REDUCTION: This exam was performed according to the departmental dose-optimization program which includes automated exposure control, adjustment of the mA and/or kV according to patient size and/or use of iterative reconstruction technique. CONTRAST:  29mL OMNIPAQUE IOHEXOL 350 MG/ML SOLN COMPARISON:  03/30/2017 FINDINGS: Cardiovascular: Satisfactory opacification of the pulmonary arteries to the segmental level. No  evidence of pulmonary embolism. Extrinsic narrowing of medial basal segmental pulmonary arteries on the right due to mass. Normal heart size. No pericardial effusion. Mediastinum/Nodes: Subcarinal, right hilar, and right paratracheal lymphadenopathy. A subcarinal node measures 2.7 cm in diameter. Lungs/Pleura: Lobulated mass in the right lower lobe, peribronchovascular and measuring at least 4 cm in length with probable postobstructive dilatation of associated distal bronchi. Generalized airway thickening. Upper Abdomen: Cirrhotic appearance of the liver since prior with large caudate lobe and diffuse surface lobulation. There are ill-defined lesions in the central liver measuring 3.5 cm and in the right lower lobe measuring 2.4 cm. Musculoskeletal: No acute or aggressive finding Review of the MIP images confirms the above findings. Prelim sent in epic chat. IMPRESSION: 1. Right lower lobe mass with subcarinal and ipsilateral mediastinal lymphadenopathy, most consistent with metastatic bronchogenic carcinoma. 2. Two ill-defined lesions within the cirrhotic liver which could be primary or metastatic. 3. Negative for pulmonary embolism. Electronically Signed   By: Jorje Guild M.D.   On: 05/16/2022 07:23   DG Chest 2 View  Result Date: 05/16/2022 CLINICAL DATA:  62 year old male with history of shortness of breath. EXAM: CHEST - 2 VIEW COMPARISON:  Chest x-ray 03/30/2017. FINDINGS: Lung volumes are normal. No consolidative airspace disease. No pleural effusions. No pneumothorax. No pulmonary nodule or mass noted. Pulmonary vasculature and the cardiomediastinal silhouette are within normal limits. IMPRESSION: No radiographic evidence of acute cardiopulmonary disease. Electronically Signed   By: Vinnie Langton M.D.   On: 05/16/2022 05:48     Subjective: Patient seen examined at bedside, resting comfortably.  Lying in bed.  Discussed findings of MRCP with patient and daughter on the telephone this morning.   Updated GI, no further testing or procedures planned at this point from their standpoint.  Discussed needs close follow-up with pulmonology and medical oncology for biopsy results and discussion for initiation of treatment.  No other specific questions or concerns at this time.  Denies headache, no visual changes, no chest pain, no shortness of breath, no abdominal pain, no nausea/vomiting/diarrhea, no fever/chills/night sweats.  No acute events overnight per nursing staff.  Discharge Exam: Vitals:   05/18/22 0318 05/18/22 0838  BP: (!) 181/92 (!) 165/90  Pulse: 76 79  Resp: 17 19  Temp: 97.8 F (36.6 C) 97.8 F (36.6 C)  SpO2: 95% 93%   Vitals:   05/17/22 1715 05/18/22 0004 05/18/22 0318 05/18/22 0838  BP: (!) 163/89 (!) 160/82 (!) 181/92 (!) 165/90  Pulse: 83 69 76 79  Resp: 18 18 17 19   Temp: 98.3 F (36.8 C) 97.9 F (36.6 C) 97.8 F (36.6 C) 97.8 F (36.6 C)  TempSrc: Oral Oral Oral Oral  SpO2: 94% 95% 95% 93%  Weight:      Height:        Physical Exam: GEN: NAD, alert and oriented x 3, wd/wn HEENT: NCAT, PERRL, EOMI, sclera clear, MMM PULM: CTAB w/o wheezes/crackles, normal respiratory effort, on room air CV: RRR w/o M/G/R GI: abd soft, NTND, NABS, no R/G/M MSK: no peripheral edema, muscle strength globally intact 5/5 bilateral upper/lower extremities NEURO: CN II-XII intact, no focal deficits, sensation to light touch intact PSYCH: normal mood/affect Integumentary: dry/intact, no rashes or wounds    The results of significant diagnostics from this hospitalization (including imaging, microbiology, ancillary and laboratory) are listed below for reference.     Microbiology: Recent Results (from the past 240 hour(s))  SARS Coronavirus 2 by RT PCR (hospital order, performed in [facility] hospital lab) *cepheid single result test* Anterior Nasal Swab     Status: None   Collection Time: 05/16/22  9:41 AM   Specimen: Anterior Nasal Swab  Result Value Ref Range Status    SARS Coronavirus 2 by RT PCR NEGATIVE NEGATIVE Final    Comment: Performed at [facility], [address] [place], [place] [address]     Labs: BNP (last 3 results) Recent Labs    05/16/22 0517  BNP 292.4*   Basic Metabolic Panel: Recent Labs  Lab 05/16/22 0517 05/17/22 0042 05/18/22 0258  NA 127* 130* 134*  K 4.0 3.4* 3.4*  CL 90* 88* 89*  CO2 25 30 36*  GLUCOSE 141* 134* 150*  BUN 11 14 21   CREATININE 0.71 0.95 1.07  CALCIUM 8.0* 8.0* 8.2*  MG  --   --  2.7*   Liver Function Tests: Recent Labs  Lab 05/16/22 0517 05/17/22 0042 05/18/22 0258  AST 138* 136* 159*  ALT 310* 313* 327*  ALKPHOS 406* 433* 412*  BILITOT 4.9* 5.5* 5.9*  PROT 5.9* 6.0* 5.7*  ALBUMIN 3.3* 3.3* 2.9*   Recent Labs  Lab 05/16/22 0517  LIPASE 62*   No results for input(s): "AMMONIA" in the last 168 hours. CBC: Recent Labs  Lab 05/16/22 0517 05/17/22 0042  WBC 15.6* 14.5*  NEUTROABS 13.3*  --   HGB 13.5 14.1  HCT 39.1 40.0  MCV 89.9 88.1  PLT 280 270   Cardiac Enzymes: No results for input(s): "CKTOTAL", "CKMB", "CKMBINDEX", "TROPONINI" in the last 168 hours. BNP: Invalid input(s): "POCBNP" CBG: Recent Labs  Lab 05/16/22 2117 05/17/22 1709 05/17/22 2105 05/18/22 0804  GLUCAP 158* 163* 195* 160*   D-Dimer No results for input(s): "DDIMER" in the last 72 hours. Hgb A1c Recent Labs    05/17/22 0039  HGBA1C 6.1*   Lipid Profile No results for input(s): "CHOL", "HDL", "LDLCALC", "TRIG", "CHOLHDL", "LDLDIRECT" in the last 72 hours. Thyroid function  studies Recent Labs    05/16/22 0517  TSH 0.455   Anemia work up No results for input(s): "VITAMINB12", "FOLATE", "FERRITIN", "TIBC", "IRON", "RETICCTPCT" in the last 72 hours. Urinalysis    Component Value Date/Time   COLORURINE AMBER (A) 05/16/2022 0611   APPEARANCEUR CLEAR 05/16/2022 0611   LABSPEC 1.015 05/16/2022 0611   PHURINE 7.5 05/16/2022 0611   GLUCOSEU 100 (A) 05/16/2022 0611   HGBUR NEGATIVE  05/16/2022 0611   BILIRUBINUR MODERATE (A) 05/16/2022 0611   KETONESUR NEGATIVE 05/16/2022 0611   PROTEINUR 30 (A) 05/16/2022 0611   NITRITE NEGATIVE 05/16/2022 0611   LEUKOCYTESUR NEGATIVE 05/16/2022 0611   Sepsis Labs Recent Labs  Lab 05/16/22 0517 05/17/22 0042  WBC 15.6* 14.5*   Microbiology Recent Results (from the past 240 hour(s))  SARS Coronavirus 2 by RT PCR (hospital order, performed in [facility] hospital lab) *cepheid single result test* Anterior Nasal Swab     Status: None   Collection Time: 05/16/22  9:41 AM   Specimen: Anterior Nasal Swab  Result Value Ref Range Status   SARS Coronavirus 2 by RT PCR NEGATIVE NEGATIVE Final    Comment: Performed at [facility], [place] [address] [place], [place] [address]     Time coordinating discharge: Over 30 minutes  SIGNED:   Keymari Sato J [place] DO  [facility] 05/18/2022, 11:17 AM

## 2022-05-18 NOTE — Plan of Care (Signed)

## 2022-05-18 NOTE — Telephone Encounter (Signed)
PET -I told pt that Matthew [name] said it is ok to go to [place] for 7-10 days to visit family. He will return to [facility] on March 1st.   I told him he can get his PET scan the week of 03/04  and see Matthew [name] a few days later.   Pathology-He would like a call when the results are available.

## 2022-05-18 NOTE — Progress Notes (Signed)
Discharge instructions given to pt. Pt verbalized understanding of all teaching and had no further questions. Pt made aware that per Dr. [place] he is stopping his Crestor because of his liver issues, pt verbalized understanding. Both IVs removed. Pt currently getting dressed for discharge. He drove himself so he will be wheeled to his car via wheelchair.

## 2022-05-18 NOTE — Progress Notes (Signed)
   NAME:  Matthew [name], MRN:  [id number], DOB:  1960/12/02, LOS: 0 ADMISSION DATE:  05/16/2022, CONSULTATION DATE:  05/16/22 REFERRING MD:  Vanita Panda, CHIEF COMPLAINT:  bloating   History of Present Illness:  62 year old man with hx of heavy smoking presenting with insidious onset worsening DOE, orthopnea and abdominal bloating over past month.  Workup has revealed extensive cancer likely lung origin for which PCCM is consulted.  No hemoptysis, has tolerated anesthesia in past.  Workup also showing transaminitis from liver mets and hyponatremia presumably paraneoplastic.  Pertinent  Medical History  Mild OSA HTN  Significant Hospital Events: Including procedures, antibiotic start and stop dates in addition to other pertinent events   2/19 admit 2/20 EBUS with biopsy from station 7 node  Interim History / Subjective:  No complaints post biopsy.  Objective   Blood pressure (!) 165/90, pulse 79, temperature 97.8 F (36.6 C), temperature source Oral, resp. rate 19, height 5\' 5"  (1.651 m), weight 90.5 kg, SpO2 93 %.        Intake/Output Summary (Last 24 hours) at 05/18/2022 0913 Last data filed at 05/18/2022 0005 Gross per 24 hour  Intake 740 ml  Output 515 ml  Net 225 ml   Filed Weights   05/17/22 0836  Weight: 90.5 kg    Examination: General: Adult male, resting in bed, in NAD. Neuro: A&O x 3, no deficits. HEENT: [place]/AT. Sclerae anicteric. EOMI. Cardiovascular: RRR, no M/R/G.  Lungs: Respirations even and unlabored.  CTA bilaterally, No W/R/R. Abdomen: BS x 4, soft, NT/ND.  Musculoskeletal: No gross deformities, no edema.  Skin: Intact, warm, no rashes.  Assessment & Plan:   Metastatic lung cancer with extensive liver and bone involvement - s/p EBUS and biopsy 2/20 Hyponatremia question related to liver injury vs. Paraneoplastic vs. HCTZ effect HTN Acute liver injury, alk phos elevation- related to heavy metastatic burden to liver/bone  - Oncology following and will  arrange for PET and outpatient follow up. - Cytology all pending. - Can arrange for outpatient pulmonary follow up after he has met with Oncology again as outpatient.  Rest per primary team. Nothing further to add.  PCCM will sign off.  Please call us back if we can be of any further assistance.   Montey Hora, [facility] For pager details, please see AMION or use Epic chat  After 1900, please call [facility] for cross coverage needs 05/18/2022, 9:18 AM

## 2022-05-18 NOTE — Care Management (Signed)
  Transition of Care [facility]) Screening Note   Patient Details  Name: Matthew [name] Date of Birth: 08-22-1960   Transition of Care [facility]) CM/SW Contact:    Carles Collet, RN Phone Number: 05/18/2022, 7:58 AM    Transition of Care Department [facility]) has reviewed patient and no TOC needs have been identified at this time. We will continue to monitor patient advancement through interdisciplinary progression rounds. If new patient transition needs arise, please place a TOC consult.

## 2022-05-18 NOTE — Progress Notes (Signed)
Pt discharged to home via wheelchair with all belongings and paperwork

## 2022-05-19 ENCOUNTER — Telehealth: Payer: Self-pay | Admitting: Medical Oncology

## 2022-05-19 ENCOUNTER — Other Ambulatory Visit: Payer: Self-pay | Admitting: Medical Oncology

## 2022-05-19 DIAGNOSIS — C3490 Malignant neoplasm of unspecified part of unspecified bronchus or lung: Secondary | ICD-10-CM

## 2022-05-19 NOTE — Telephone Encounter (Signed)
Scheduled Pt for A consult with Dr. Valeta Harms for 06/06/2022 @ 8:30am

## 2022-05-19 NOTE — Telephone Encounter (Signed)
PET scan scheduled and pt instructed to be NPO 6 hours prior to scan and may drink water. No gum, candy, etc

## 2022-05-20 LAB — CYTOLOGY - NON PAP

## 2022-05-24 NOTE — Progress Notes (Signed)
Matthew [name],   I tried calling the patient. No answer. Pathology consistent with small cell. He has appt with oncology already scheduled for next week.   He has follow up with me in 2 weeks that I am not sure he will need. He will really need to see oncology from this point on.   Thanks,  BLI  Garner Nash, DO  Pulmonary Critical Care 05/24/2022 10:09 AM

## 2022-05-25 ENCOUNTER — Telehealth: Payer: Self-pay | Admitting: Medical Oncology

## 2022-05-25 ENCOUNTER — Telehealth: Payer: Self-pay | Admitting: Pulmonary Disease

## 2022-05-25 NOTE — Telephone Encounter (Signed)
Patient stated that he got a call stating he did not have to come to his upcoming call on 3/11.  The appt. Is still listed on the schedule and patient would like clarification as to whether he still should come or not.  Please call patient to verify.  CB# [phone]

## 2022-05-25 NOTE — Telephone Encounter (Signed)
Handicap Placard requested- Pt very weak and SOB.  Hanidcap placard on Mohameds desk for signature.Pt will pick it up friday

## 2022-05-25 NOTE — Telephone Encounter (Signed)
Dr. Valeta Harms advised patient to f/u with Dr. Ellan Lambert office. I have canceled apt with Dr. Valeta Harms. Patient is aware nothing further needed.

## 2022-05-25 NOTE — Telephone Encounter (Signed)
Dr. Valeta Harms can you please advise? Patient he received a call advising he would not need to come in to apt on 3/11 but apt is still there. Does he still need to be seen

## 2022-05-29 NOTE — Progress Notes (Unsigned)
[facility] OFFICE PROGRESS NOTE  Matthew [name], [place] [address]  DIAGNOSIS: stage IV (T2a, N2, M1 C) small cell lung cancer, he presented with a RLL lung mass with subcarinal and ipsilateral mediastinal lymphadenopathy and diffuse hepatic metastatic disease, splenic metastatic disease, and osseous metastatic disease. Diagnosed in February 2024.   PRIOR THERAPY: None  CURRENT THERAPY: Palliative systemic chemotherapy with carboplatin for an AUC of 5 and etoposide 100 mg/m on days 1, 2, and 3 with Imfinzi 1500 mg IV every 3 weeks.  First dose expected on 06/06/2022.  INTERVAL HISTORY: Matthew [name] 62 y.o. male returns to clinic today to establish care on an outpatient basis.  The patient presented to the emergency room on 2/19 for shortness of breath, abdominal distention, and dark urine.  He was found to have lung mass and metastatic disease to the bone, liver, spleen, and lymphadenopathy.  He underwent bronchoscopy and biopsy under the care of Dr. Valeta Harms on 05/17/2022 and the final pathology showed small cell lung cancer.  The patiently recently returned to town from [place] this past weekend.  He overall is feeling ***.  He denies any fever, chills, or night sweats.  Weight loss?  Shortness of breath?  Cough?  Denies any chest pain or hemoptysis.  Denies any nausea, vomiting, diarrhea, constipation, abdominal pain?  Denies any headache or visual changes.  The patient's staging PET scan is not scheduled at this time.  He did have his brain MRI while admitted to the hospital which was negative for metastatic disease.  He is here today for more detailed discussion about his current condition and recommended treatment options.  MEDICAL HISTORY: Past Medical History:  Diagnosis Date   Acute bronchitis 03/30/2017   Acute maxillary sinusitis 07/25/2017   Acute otitis media, right 05/05/2016   Acute respiratory failure with hypoxia ([facility])  03/30/2017   CAP (community acquired pneumonia) 03/31/2017   COPD (chronic obstructive pulmonary disease) ([facility])    Diarrhea 10/16/2017   Diverticulitis    Ear build-up, left 08/10/2017   Hypertension    Left otitis media 07/17/2017   Pharyngitis 05/05/2016   Renal artery stenosis ([facility])    1 to 59% bilateral by Dopplers 12/2020   Sigmoid diverticulitis 02/11/2018    ALLERGIES:  has No Known Allergies.  MEDICATIONS:  Current Outpatient Medications  Medication Sig Dispense Refill   amLODipine (NORVASC) 10 MG tablet TAKE 1 TABLET BY MOUTH DAILY (Patient taking differently: Take 10 mg by mouth at bedtime.) 90 tablet 0   aspirin EC 81 MG tablet Take 1 tablet (81 mg total) by mouth daily. Swallow whole. 90 tablet 3   famotidine-calcium carbonate-magnesium hydroxide (PEPCID COMPLETE) 10-800-165 MG chewable tablet Chew 1 tablet by mouth daily as needed (indigestion, heartburn).     furosemide (LASIX) 20 MG tablet Take 1 tablet (20 mg total) by mouth daily. 30 tablet 2   ibuprofen (ADVIL) 200 MG tablet Take 600 mg by mouth daily as needed for headache, mild pain or moderate pain.     LORazepam (ATIVAN) 0.5 MG tablet Take 1 tablet (0.5 mg total) by mouth every 6 (six) hours as needed for anxiety. 30 tablet 2   metoprolol succinate (TOPROL-XL) 100 MG 24 hr tablet Take 1 tablet (100 mg total) by mouth daily. (Patient taking differently: Take 100 mg by mouth at bedtime.) 90 tablet 3   metoprolol tartrate (LOPRESSOR) 50 MG tablet Take one tablet by mouth 2 hours prior to CT (Patient  not taking: Reported on 05/16/2022) 1 tablet 0   nitroGLYCERIN (NITROSTAT) 0.4 MG SL tablet Place 1 tablet (0.4 mg total) under the tongue every 5 (five) minutes as needed for chest pain. 25 tablet 3   omeprazole (PRILOSEC OTC) 20 MG tablet Take 20 mg by mouth daily as needed (indigestion, heartburn).     traZODone (DESYREL) 50 MG tablet Take 1 tablet (50 mg total) by mouth at bedtime. 30 tablet 2   No current  facility-administered medications for this visit.    SURGICAL HISTORY:  Past Surgical History:  Procedure Laterality Date   FINE NEEDLE ASPIRATION  05/17/2022   Procedure: FINE NEEDLE ASPIRATION (FNA) LINEAR;  Surgeon: Garner Nash, DO;  Location: [facility];  Service: Cardiopulmonary;;   HERNIA REPAIR     HERNIA REPAIR  1966   VIDEO BRONCHOSCOPY WITH ENDOBRONCHIAL ULTRASOUND N/A 05/17/2022   Procedure: VIDEO BRONCHOSCOPY WITH ENDOBRONCHIAL ULTRASOUND;  Surgeon: Garner Nash, DO;  Location: [facility];  Service: Cardiopulmonary;  Laterality: N/A;    REVIEW OF SYSTEMS:   Review of Systems  Constitutional: Negative for appetite change, chills, fatigue, fever and unexpected weight change.  HENT:   Negative for mouth sores, nosebleeds, sore throat and trouble swallowing.   Eyes: Negative for eye problems and icterus.  Respiratory: Negative for cough, hemoptysis, shortness of breath and wheezing.   Cardiovascular: Negative for chest pain and leg swelling.  Gastrointestinal: Negative for abdominal pain, constipation, diarrhea, nausea and vomiting.  Genitourinary: Negative for bladder incontinence, difficulty urinating, dysuria, frequency and hematuria.   Musculoskeletal: Negative for back pain, gait problem, neck pain and neck stiffness.  Skin: Negative for itching and rash.  Neurological: Negative for dizziness, extremity weakness, gait problem, headaches, light-headedness and seizures.  Hematological: Negative for adenopathy. Does not bruise/bleed easily.  Psychiatric/Behavioral: Negative for confusion, depression and sleep disturbance. The patient is not nervous/anxious.     PHYSICAL EXAMINATION:  There were no vitals taken for this visit.  ECOG PERFORMANCE STATUS: {CHL ONC ECOG [id number]  Physical Exam  Constitutional: Oriented to person, place, and time and well-developed, well-nourished, and in no distress. No distress.  HENT:  Head: Normocephalic and atraumatic.   Mouth/Throat: Oropharynx is clear and moist. No oropharyngeal exudate.  Eyes: Conjunctivae are normal. Right eye exhibits no discharge. Left eye exhibits no discharge. No scleral icterus.  Neck: Normal range of motion. Neck supple.  Cardiovascular: Normal rate, regular rhythm, normal heart sounds and intact distal pulses.   Pulmonary/Chest: Effort normal and breath sounds normal. No respiratory distress. No wheezes. No rales.  Abdominal: Soft. Bowel sounds are normal. Exhibits no distension and no mass. There is no tenderness.  Musculoskeletal: Normal range of motion. Exhibits no edema.  Lymphadenopathy:    No cervical adenopathy.  Neurological: Alert and oriented to person, place, and time. Exhibits normal muscle tone. Gait normal. Coordination normal.  Skin: Skin is warm and dry. No rash noted. Not diaphoretic. No erythema. No pallor.  Psychiatric: Mood, memory and judgment normal.  Vitals reviewed.  LABORATORY DATA: Lab Results  Component Value Date   WBC 14.5 (H) 05/17/2022   HGB 14.1 05/17/2022   HCT 40.0 05/17/2022   MCV 88.1 05/17/2022   PLT 270 05/17/2022      Chemistry      Component Value Date/Time   NA 134 (L) 05/18/2022 0258   NA 134 05/31/2021 1633   K 3.4 (L) 05/18/2022 0258   CL 89 (L) 05/18/2022 0258   CO2 36 (H) 05/18/2022 0258  BUN 21 05/18/2022 0258   BUN 14 05/31/2021 1633   CREATININE 1.07 05/18/2022 0258      Component Value Date/Time   CALCIUM 8.2 (L) 05/18/2022 0258   ALKPHOS 412 (H) 05/18/2022 0258   AST 159 (H) 05/18/2022 0258   ALT 327 (H) 05/18/2022 0258   BILITOT 5.9 (H) 05/18/2022 0258   BILITOT 0.5 12/15/2020 [id number]       RADIOGRAPHIC STUDIES:  MR ABDOMEN MRCP W WO CONTAST  Result Date: 05/18/2022 CLINICAL DATA:  Inpatient. Right lower lobe lung mass with mediastinal adenopathy suspicious for primary lung malignancy on recent chest CT angiogram. Evaluate abnormal liver and spleen suspicious for metastatic disease on recent CT  abdomen/pelvis. Jaundice. EXAM: MRI ABDOMEN WITHOUT AND WITH CONTRAST (INCLUDING MRCP) TECHNIQUE: Multiplanar multisequence MR imaging of the abdomen was performed both before and after the administration of intravenous contrast. Heavily T2-weighted images of the biliary and pancreatic ducts were obtained, and three-dimensional MRCP images were rendered by post processing. CONTRAST:  38m GADAVIST GADOBUTROL 1 MMOL/ML IV SOLN COMPARISON:  05/16/2022 chest CT angiogram and CT abdomen/pelvis. FINDINGS: Lower chest: Partially visualized irregular solid 4.2 cm right lower lobe lung mass (series 4/image 1). Partially visualized subcarinal lymphadenopathy on series 5/image 16). Hepatobiliary: Hepatomegaly. Liver is nearly completely replaced by innumerable (> than 50) confluent masses with targetoid enhancement, all new since 02/11/2018 CT. Representative 6.3 x 5.0 cm segment 3 left liver mass (series 4/image 20). Representative 4.2 x 3.8 cm segment 4A left liver mass (series 4/image 10). Representative 3.5 x 3.0 cm segment 5 right liver mass (series 4/image 21). Contracted gallbladder with no cholelithiasis. No biliary ductal dilatation. Common bile duct diameter 2 mm. No choledocholithiasis. No biliary masses, strictures or beading. Pancreas: No pancreatic mass or duct dilation.  No pancreas divisum. Spleen: Normal size spleen. Posterior splenic 2.2 x 2.2 cm mass (series 4/image 13) with heterogeneous hypoenhancement, new from 02/11/2018 CT. Adrenals/Urinary Tract: Normal adrenals. No hydronephrosis. Normal kidneys with no renal mass. Enhancing 0.7 cm solid left retroperitoneal nodule inferior to lower left renal pole (series 8/image 31). Stomach/Bowel: Normal non-distended stomach. Visualized small and large bowel is normal caliber, with no bowel wall thickening. Vascular/Lymphatic: Atherosclerotic nonaneurysmal abdominal aorta. Patent portal, splenic, hepatic and renal veins. No pathologically enlarged lymph nodes in  the abdomen. Other: No abdominal ascites or focal fluid collection. Musculoskeletal: Widespread patchy enhancing bone lesions throughout lumbar vertebral bodies and visualized bilateral pelvic girdle, for example a 2.2 cm in the medial right iliac bone (series 28/image 12) and 2.1 cm in left L3 vertebral body (series 28/image 25). IMPRESSION: 1. Hepatomegaly. Liver is nearly completely replaced by innumerable confluent masses with targetoid enhancement, all new since 02/11/2018 CT, compatible with widespread hepatic metastatic disease. 2. Widespread patchy enhancing bone lesions throughout the lumbar vertebral bodies and visualized bilateral pelvic girdle, compatible with widespread osseous metastatic disease. 3. Enhancing 0.7 cm solid left retroperitoneal nodule inferior to lower left renal pole, potentially a small retroperitoneal metastasis. 4. Hypoenhancing solitary splenic metastasis. 5. Partially visualized solid 4.2 cm right lower lobe lung mass and partially visualized subcarinal lymphadenopathy, compatible with presumed primary lung malignancy and metastatic thoracic adenopathy. 6. No biliary ductal dilatation. No cholelithiasis. Electronically Signed   By: JIlona SorrelM.D.   On: 05/18/2022 10:07   MR 3D Recon At Scanner  Result Date: 05/18/2022 CLINICAL DATA:  Inpatient. Right lower lobe lung mass with mediastinal adenopathy suspicious for primary lung malignancy on recent chest CT angiogram. Evaluate abnormal liver and spleen suspicious  for metastatic disease on recent CT abdomen/pelvis. Jaundice. EXAM: MRI ABDOMEN WITHOUT AND WITH CONTRAST (INCLUDING MRCP) TECHNIQUE: Multiplanar multisequence MR imaging of the abdomen was performed both before and after the administration of intravenous contrast. Heavily T2-weighted images of the biliary and pancreatic ducts were obtained, and three-dimensional MRCP images were rendered by post processing. CONTRAST:  56m GADAVIST GADOBUTROL 1 MMOL/ML IV SOLN  COMPARISON:  05/16/2022 chest CT angiogram and CT abdomen/pelvis. FINDINGS: Lower chest: Partially visualized irregular solid 4.2 cm right lower lobe lung mass (series 4/image 1). Partially visualized subcarinal lymphadenopathy on series 5/image 16). Hepatobiliary: Hepatomegaly. Liver is nearly completely replaced by innumerable (> than 50) confluent masses with targetoid enhancement, all new since 02/11/2018 CT. Representative 6.3 x 5.0 cm segment 3 left liver mass (series 4/image 20). Representative 4.2 x 3.8 cm segment 4A left liver mass (series 4/image 10). Representative 3.5 x 3.0 cm segment 5 right liver mass (series 4/image 21). Contracted gallbladder with no cholelithiasis. No biliary ductal dilatation. Common bile duct diameter 2 mm. No choledocholithiasis. No biliary masses, strictures or beading. Pancreas: No pancreatic mass or duct dilation.  No pancreas divisum. Spleen: Normal size spleen. Posterior splenic 2.2 x 2.2 cm mass (series 4/image 13) with heterogeneous hypoenhancement, new from 02/11/2018 CT. Adrenals/Urinary Tract: Normal adrenals. No hydronephrosis. Normal kidneys with no renal mass. Enhancing 0.7 cm solid left retroperitoneal nodule inferior to lower left renal pole (series 8/image 31). Stomach/Bowel: Normal non-distended stomach. Visualized small and large bowel is normal caliber, with no bowel wall thickening. Vascular/Lymphatic: Atherosclerotic nonaneurysmal abdominal aorta. Patent portal, splenic, hepatic and renal veins. No pathologically enlarged lymph nodes in the abdomen. Other: No abdominal ascites or focal fluid collection. Musculoskeletal: Widespread patchy enhancing bone lesions throughout lumbar vertebral bodies and visualized bilateral pelvic girdle, for example a 2.2 cm in the medial right iliac bone (series 28/image 12) and 2.1 cm in left L3 vertebral body (series 28/image 25). IMPRESSION: 1. Hepatomegaly. Liver is nearly completely replaced by innumerable confluent masses  with targetoid enhancement, all new since 02/11/2018 CT, compatible with widespread hepatic metastatic disease. 2. Widespread patchy enhancing bone lesions throughout the lumbar vertebral bodies and visualized bilateral pelvic girdle, compatible with widespread osseous metastatic disease. 3. Enhancing 0.7 cm solid left retroperitoneal nodule inferior to lower left renal pole, potentially a small retroperitoneal metastasis. 4. Hypoenhancing solitary splenic metastasis. 5. Partially visualized solid 4.2 cm right lower lobe lung mass and partially visualized subcarinal lymphadenopathy, compatible with presumed primary lung malignancy and metastatic thoracic adenopathy. 6. No biliary ductal dilatation. No cholelithiasis. Electronically Signed   By: JIlona SorrelM.D.   On: 05/18/2022 10:07   MR BRAIN W WO CONTRAST  Result Date: 05/17/2022 CLINICAL DATA:  Non-small cell lung cancer (NSCLC), staging EXAM: MRI HEAD WITHOUT AND WITH CONTRAST TECHNIQUE: Multiplanar, multiecho pulse sequences of the brain and surrounding structures were obtained without and with intravenous contrast. CONTRAST:  98mGADAVIST GADOBUTROL 1 MMOL/ML IV SOLN COMPARISON:  None Available. FINDINGS: Brain: No acute infarction, hemorrhage, hydrocephalus, extra-axial collection or mass lesion. No pathologic enhancement. Vascular: Major arterial flow voids are maintained at the skull base. Skull and upper cervical spine: Normal marrow signal. Sinuses/Orbits: Clear sinuses.  No acute orbital findings. IMPRESSION: No evidence of acute intracranial abnormality or metastatic disease. Electronically Signed   By: FrMargaretha Sheffield.D.   On: 05/17/2022 14:04   CT ABDOMEN PELVIS W CONTRAST  Result Date: 05/16/2022 CLINICAL DATA:  Abdominal pain EXAM: CT ABDOMEN AND PELVIS WITH CONTRAST TECHNIQUE: Multidetector CT imaging  of the abdomen and pelvis was performed using the standard protocol following bolus administration of intravenous contrast. RADIATION  DOSE REDUCTION: This exam was performed according to the departmental dose-optimization program which includes automated exposure control, adjustment of the mA and/or kV according to patient size and/or use of iterative reconstruction technique. CONTRAST:  51m OMNIPAQUE IOHEXOL 350 MG/ML SOLN COMPARISON:  CT AP 02/11/18 FINDINGS: Lower chest: Please see separately dictated CTA chest for thoracic findings including a masslike lesion in the right lower lobe. Hepatobiliary: There hepatomegaly with diffusely heterogeneous appearance of the hepatic parenchyma. There is a more focal discrete lesion in the right hepatic lobe measuring up to 3.4 x 4.0 cm. There is an additional discrete lesion in the inferior right hepatic lobe measuring approximately 2.7 x 2.3 cm (series 3, image 30). Findings are worrisome for diffuse metastatic disease. Portal vein is contrast opacified. Gallbladder is normal in appearance. No evidence of intra or extrahepatic biliary ductal dilatation. Pancreas: No evidence of pancreatic ductal dilatation. No evidence of peripancreatic fat stranding. Spleen: Spleen is normal in size. There is a new hypodense lesion in the posterior aspect of the spleen measuring 1.8 x 2.0 cm (series 3, image 16). Adrenals/Urinary Tract: Bilateral adrenal glands are normal in appearance without evidence of focal lesion. Bilateral kidneys are normal in size and enhance homogeneously. No evidence of hydronephrosis or nephrolithiasis. Stomach/Bowel: The stomach, small bowel, large bowel are normal in caliber without evidence of obstruction. There is diverticulosis with persistent long segment wall thickening sigmoid colon (series 3, image 60). There is no evidence of surrounding inflammatory fat stranding. Vascular/Lymphatic: No significant vascular findings are present. No enlarged abdominal or pelvic lymph nodes. Aortic atherosclerotic calcification. Reproductive: Prostate is unremarkable. Other: No abdominal wall hernia  or abnormality. No abdominopelvic ascites. Musculoskeletal: There is a lytic lesion of the superior endplate of L3 (series 7, image 70) worrisome for osseous metastatic disease. IMPRESSION: 1. Hepatomegaly with diffusely heterogeneous appearance of the hepatic parenchyma. There are two discrete lesions in the right hepatic lobe, as above. Findings are worrisome for diffuse metastatic disease. Recommend further evaluation with a contrast enhanced MRI. 2. There is a new hypodense lesion in the posterior aspect of the spleen measuring up to 2.0 cm. This is concerning for metastatic disease. 3. There is a lytic lesion of the superior endplate of L3 worrisome for osseous metastatic disease. Recommend futher evaluation with a contrast enhanaced lumbar spine MRI. 4. There is diverticulosis with persistent long segment wall thickening of the sigmoid colon. There is no evidence of surrounding inflammatory fat stranding. Findings may represent sequela of chronic diverticulitis. However, given the long segment wall thickening, recommend correlation with colonoscopy to exclude underlying malignancy. Aortic Atherosclerosis (ICD10-I70.0). Electronically Signed   By: HMarin RobertsM.D.   On: 05/16/2022 08:14   CT Angio Chest Pulmonary Embolism (PE) W or WO Contrast  Result Date: 05/16/2022 CLINICAL DATA:  Pulmonary embolism suspected, high probability. Shortness of breath which is increased for 1 week EXAM: CT ANGIOGRAPHY CHEST WITH CONTRAST TECHNIQUE: Multidetector CT imaging of the chest was performed using the standard protocol during bolus administration of intravenous contrast. Multiplanar CT image reconstructions and MIPs were obtained to evaluate the vascular anatomy. RADIATION DOSE REDUCTION: This exam was performed according to the departmental dose-optimization program which includes automated exposure control, adjustment of the mA and/or kV according to patient size and/or use of iterative reconstruction technique.  CONTRAST:  787mOMNIPAQUE IOHEXOL 350 MG/ML SOLN COMPARISON:  03/30/2017 FINDINGS: Cardiovascular: Satisfactory opacification of  the pulmonary arteries to the segmental level. No evidence of pulmonary embolism. Extrinsic narrowing of medial basal segmental pulmonary arteries on the right due to mass. Normal heart size. No pericardial effusion. Mediastinum/Nodes: Subcarinal, right hilar, and right paratracheal lymphadenopathy. A subcarinal node measures 2.7 cm in diameter. Lungs/Pleura: Lobulated mass in the right lower lobe, peribronchovascular and measuring at least 4 cm in length with probable postobstructive dilatation of associated distal bronchi. Generalized airway thickening. Upper Abdomen: Cirrhotic appearance of the liver since prior with large caudate lobe and diffuse surface lobulation. There are ill-defined lesions in the central liver measuring 3.5 cm and in the right lower lobe measuring 2.4 cm. Musculoskeletal: No acute or aggressive finding Review of the MIP images confirms the above findings. Prelim sent in epic chat. IMPRESSION: 1. Right lower lobe mass with subcarinal and ipsilateral mediastinal lymphadenopathy, most consistent with metastatic bronchogenic carcinoma. 2. Two ill-defined lesions within the cirrhotic liver which could be primary or metastatic. 3. Negative for pulmonary embolism. Electronically Signed   By: Jorje Guild M.D.   On: 05/16/2022 07:23   DG Chest 2 View  Result Date: 05/16/2022 CLINICAL DATA:  62 year old male with history of shortness of breath. EXAM: CHEST - 2 VIEW COMPARISON:  Chest x-ray 03/30/2017. FINDINGS: Lung volumes are normal. No consolidative airspace disease. No pleural effusions. No pneumothorax. No pulmonary nodule or mass noted. Pulmonary vasculature and the cardiomediastinal silhouette are within normal limits. IMPRESSION: No radiographic evidence of acute cardiopulmonary disease. Electronically Signed   By: Vinnie Langton M.D.   On: 05/16/2022  05:48     ASSESSMENT/PLAN:  This is a very pleasant 62 year old Caucasian male diagnosed with extensive stage/stage IV (T2a, N2, M1 C) small cell lung cancer, he presented with a RLL lung mass with subcarinal and ipsilateral mediastinal lymphadenopathy and diffuse hepatic metastatic disease, splenic metastatic disease, and osseous metastatic disease. Diagnosed in February 2024.   The patient was seen with Dr. Julien Nordmann today.  Dr. Julien Nordmann had a lengthy discussion with the patient today about his current condition and recommended treatment options.  Dr. Julien Nordmann discussed his condition is treatable but not curable.  Dr. Julien Nordmann given the option of palliative care/hospice versus palliative systemic chemotherapy with carboplatin on day 1 and etoposide on days 1, 2, and 3 with Imfinzi 1500 mg on day 1 IV every 3 weeks.  The patient is interested in this option and he is expected to undergo his first cycle of treatment on 06/06/2022.  The adverse side effects of treatment were discussed including but not limited to alopecia, nausea, vomiting, kidney, liver dysfunction, myelosuppression, fatigue, and the adverse side effects of immunotherapy.  I will arrange for a chemo education class prior to his first cycle of treatment.  We would also recommend that he schedule his ordered staging PET scan to complete the staging workup.  I have sent in prescription for Compazine 10 mg every 6 hours as needed for nausea or vomiting.  We will see him back for follow-up visit in 2 weeks for evaluation of 1 week follow-up visit to manage any adverse side effects of treatment.  The patient was advised to call immediately if he has any concerning symptoms in the interval. The patient voices understanding of current disease status and treatment options and is in agreement with the current care plan. All questions were answered. The patient knows to call the clinic with any problems, questions or concerns. We can certainly see  the patient much sooner if necessary   No orders  of the defined types were placed in this encounter.    I spent {CHL ONC TIME VISIT - [id number] counseling the patient face to face. The total time spent in the appointment was {CHL ONC TIME VISIT - [id number].  Jonisha Kindig L Jimena Wieczorek, PA-C 05/29/22

## 2022-05-30 ENCOUNTER — Encounter (HOSPITAL_COMMUNITY): Payer: Self-pay

## 2022-05-30 ENCOUNTER — Other Ambulatory Visit: Payer: Self-pay | Admitting: Physician Assistant

## 2022-05-30 DIAGNOSIS — C3490 Malignant neoplasm of unspecified part of unspecified bronchus or lung: Secondary | ICD-10-CM

## 2022-05-31 ENCOUNTER — Other Ambulatory Visit: Payer: Self-pay | Admitting: Physician Assistant

## 2022-05-31 ENCOUNTER — Inpatient Hospital Stay: Payer: Commercial Managed Care - HMO | Admitting: Physician Assistant

## 2022-05-31 ENCOUNTER — Other Ambulatory Visit: Payer: Self-pay

## 2022-05-31 ENCOUNTER — Telehealth: Payer: Self-pay

## 2022-05-31 ENCOUNTER — Inpatient Hospital Stay: Payer: Commercial Managed Care - HMO | Attending: Physician Assistant

## 2022-05-31 VITALS — BP 121/68 | HR 72 | Temp 97.8°F | Resp 18 | Ht 65.0 in | Wt 191.3 lb

## 2022-05-31 DIAGNOSIS — C7889 Secondary malignant neoplasm of other digestive organs: Secondary | ICD-10-CM | POA: Insufficient documentation

## 2022-05-31 DIAGNOSIS — J449 Chronic obstructive pulmonary disease, unspecified: Secondary | ICD-10-CM | POA: Insufficient documentation

## 2022-05-31 DIAGNOSIS — C3431 Malignant neoplasm of lower lobe, right bronchus or lung: Secondary | ICD-10-CM | POA: Diagnosis present

## 2022-05-31 DIAGNOSIS — C7951 Secondary malignant neoplasm of bone: Secondary | ICD-10-CM | POA: Diagnosis not present

## 2022-05-31 DIAGNOSIS — C787 Secondary malignant neoplasm of liver and intrahepatic bile duct: Secondary | ICD-10-CM | POA: Diagnosis not present

## 2022-05-31 DIAGNOSIS — I10 Essential (primary) hypertension: Secondary | ICD-10-CM | POA: Diagnosis not present

## 2022-05-31 DIAGNOSIS — Z5189 Encounter for other specified aftercare: Secondary | ICD-10-CM | POA: Insufficient documentation

## 2022-05-31 DIAGNOSIS — C3490 Malignant neoplasm of unspecified part of unspecified bronchus or lung: Secondary | ICD-10-CM | POA: Diagnosis not present

## 2022-05-31 DIAGNOSIS — Z5111 Encounter for antineoplastic chemotherapy: Secondary | ICD-10-CM | POA: Insufficient documentation

## 2022-05-31 DIAGNOSIS — I6523 Occlusion and stenosis of bilateral carotid arteries: Secondary | ICD-10-CM | POA: Insufficient documentation

## 2022-05-31 DIAGNOSIS — Z79899 Other long term (current) drug therapy: Secondary | ICD-10-CM | POA: Insufficient documentation

## 2022-05-31 DIAGNOSIS — Z7982 Long term (current) use of aspirin: Secondary | ICD-10-CM | POA: Diagnosis not present

## 2022-05-31 DIAGNOSIS — R160 Hepatomegaly, not elsewhere classified: Secondary | ICD-10-CM | POA: Diagnosis not present

## 2022-05-31 DIAGNOSIS — C7800 Secondary malignant neoplasm of unspecified lung: Secondary | ICD-10-CM | POA: Insufficient documentation

## 2022-05-31 LAB — CBC WITH DIFFERENTIAL (CANCER CENTER ONLY)
Abs Immature Granulocytes: 0.41 10*3/uL — ABNORMAL HIGH (ref 0.00–0.07)
Basophils Absolute: 0 10*3/uL (ref 0.0–0.1)
Basophils Relative: 0 %
Eosinophils Absolute: 0 10*3/uL (ref 0.0–0.5)
Eosinophils Relative: 0 %
HCT: 33.8 % — ABNORMAL LOW (ref 39.0–52.0)
Hemoglobin: 12.2 g/dL — ABNORMAL LOW (ref 13.0–17.0)
Immature Granulocytes: 2 %
Lymphocytes Relative: 5 %
Lymphs Abs: 0.8 10*3/uL (ref 0.7–4.0)
MCH: 30.8 pg (ref 26.0–34.0)
MCHC: 36.1 g/dL — ABNORMAL HIGH (ref 30.0–36.0)
MCV: 85.4 fL (ref 80.0–100.0)
Monocytes Absolute: 1.4 10*3/uL — ABNORMAL HIGH (ref 0.1–1.0)
Monocytes Relative: 8 %
Neutro Abs: 14.1 10*3/uL — ABNORMAL HIGH (ref 1.7–7.7)
Neutrophils Relative %: 85 %
Platelet Count: 226 10*3/uL (ref 150–400)
RBC: 3.96 MIL/uL — ABNORMAL LOW (ref 4.22–5.81)
RDW: 19.1 % — ABNORMAL HIGH (ref 11.5–15.5)
WBC Count: 16.8 10*3/uL — ABNORMAL HIGH (ref 4.0–10.5)
nRBC: 0 % (ref 0.0–0.2)

## 2022-05-31 LAB — CMP (CANCER CENTER ONLY)
ALT: 292 U/L (ref 0–44)
AST: 115 U/L — ABNORMAL HIGH (ref 15–41)
Albumin: 3.1 g/dL — ABNORMAL LOW (ref 3.5–5.0)
Alkaline Phosphatase: 393 U/L — ABNORMAL HIGH (ref 38–126)
Anion gap: 5 (ref 5–15)
BUN: 22 mg/dL (ref 8–23)
CO2: 37 mmol/L — ABNORMAL HIGH (ref 22–32)
Calcium: 7.5 mg/dL — ABNORMAL LOW (ref 8.9–10.3)
Chloride: 95 mmol/L — ABNORMAL LOW (ref 98–111)
Creatinine: 1.13 mg/dL (ref 0.61–1.24)
GFR, Estimated: >60 mL/min (ref >60)
Glucose, Bld: 160 mg/dL — ABNORMAL HIGH (ref 70–99)
Potassium: 3.4 mmol/L — ABNORMAL LOW (ref 3.5–5.1)
Sodium: 137 mmol/L (ref 135–145)
Total Bilirubin: 17.1 mg/dL (ref 0.3–1.2)
Total Protein: 4.8 g/dL — ABNORMAL LOW (ref 6.5–8.1)

## 2022-05-31 MED ORDER — LIDOCAINE-PRILOCAINE 2.5-2.5 % EX CREA: 1.0000 | TOPICAL_CREAM | CUTANEOUS | 2 refills | Status: DC | PRN

## 2022-05-31 MED ORDER — PROCHLORPERAZINE MALEATE 10 MG PO TABS: 10.0000 mg | ORAL_TABLET | Freq: Four times a day (QID) | ORAL | 2 refills | Status: DC | PRN

## 2022-05-31 MED FILL — Fosaprepitant Dimeglumine For IV Infusion 150 MG (Base Eq): INTRAVENOUS | Qty: 5 | Status: AC

## 2022-05-31 MED FILL — Dexamethasone Sodium Phosphate Inj 100 MG/10ML: INTRAMUSCULAR | Qty: 1 | Status: AC

## 2022-05-31 NOTE — Progress Notes (Signed)
Navigator met with the pt for the first time today at his follow up appt with Dr Matthew [name]. Pt was consulted by Dr.Mohamed during his in-patient stay when that found a multiple lesions in his chest and abdomen on CT. Pt is accompanied by his daughter Matthew [name], who is up visiting from [facility] until this Saturday. [name] is urgent enough based on his current condition and lab work that Dr.Mohamed is hoping to start pt on chemotherapy (Carbo/Etoposide) tomorrow 3/6.  Pt states he has a lot of social support. His mother, Matthew [name], lives close by and he has several friends in the area to help out. His daughter, Matthew [name], lives in [place]. She has her 1y/o son visiting as well. Matthew [name] is hoping to find child care in the area while her father gets treatment so she doesn't have to have her grandmother, who is 44, have to watch him for long periods of time. Navigator has reached out to SW for assistance with this.  Pt denies transportation concerns. Navigator did emphasize the importance of having someone provide transportation on treatment days and if that is an issue, we have free transportation available.  Pt denies concerns with finances or access to food.  Pt lives alone and is currently retired, however he plans on applying for SS when [name].  Navigator provided card with contact information and encouraged calling with questions or concerns.

## 2022-05-31 NOTE — Progress Notes (Signed)
CRITICAL VALUE STICKER  CRITICAL VALUE: Bilirubin 17.1  RECEIVER (on-site recipient of call): Arna Snipe RN  [place] NOTIFIED: 05/31/22  0958  MESSENGER (representative from lab): Rosann Auerbach  MD NOTIFIED:  Dr. Thad Ranger H PA  TIME OF NOTIFICATION: 1000  RESPONSE:  Acknowledged information

## 2022-05-31 NOTE — Patient Instructions (Addendum)
-  There are two main categories of lung cancer, they are named based on the size of the cancer cell. One is called Non-Small cell lung cancer. 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 Here are the the main points that were discussed at your office visit with [place] today:  -The treatment that you will receive consists of Two chemotherapy drugs, called Carboplatin and Etoposide and One Immunotherapy drug called Imfinzi (Durvalumab)  -We are planning on starting your treatment next week on 06/06/22 but before your start your treatment, I would like you to attend a Chemotherapy Education Class. This involves having you sit down with one of our nurse educators. She will discuss with your one-on-one more details about your treatment as well as general information about resources here at the [facility] treatment will be given for three consecutive days every 3 weeks. We will check your labs once a week for the first ~4 treatments just to make sure that important components of your blood are in an acceptable range -We will get a CT scan after 2 treatments to check on the progress of treatment  Medications:  -I have sent a few important medication prescriptions to your pharmacy.  -Compazine was sent to your pharmacy. This medication is for nausea. You may take this every 6 hours as needed if you feel nausous. .   Side Effects:  -The adverse effect of this treatment including but not limited to alopecia (losing your hair), myelosuppression (drops in the blood counts), nausea and vomiting, peripheral neuropathy (numbness and tingling in the hands and feet), liver or renal dysfunction.   Referrals:   Imaging:  -We can certainly check the belly to make sure no fluid to drain. They will use an  ultrasound machine to look. If there is no fluid, they will not proceed with the drainage.  -Please keep the PET scan appointment as scheduled.   Follow up:  -We will see you back for a follow up visit in 2 weeks for a 1 week follow up visit.

## 2022-05-31 NOTE — Telephone Encounter (Signed)
Spoke with patient and daughter to advise that P2P is scheduled for 6:45 PM this evening. Cassie PA will call them after P2P to let them know if infusion scheduled for 06/01/22 has been approved.

## 2022-05-31 NOTE — Progress Notes (Signed)
I called the patient and his daughter to let him know his chemo was approved for tomorrow. Approval [id number]. We discussed port placement. I have placed order. I will reach out to his infusion nurse tomorrow to discuss port-a-cath in more depth. They will be here tomorrow at 7:30 AM for his infusion.

## 2022-05-31 NOTE — Progress Notes (Signed)
START ON PATHWAY REGIMEN - Small Cell Lung     Cycles 1 through 4: A cycle is every 21 days:     Durvalumab      Carboplatin      Etoposide    Cycles 5 and beyond: A cycle is every 28 days:     Durvalumab   **Always confirm dose/schedule in your pharmacy ordering system**  Patient Characteristics: Newly Diagnosed, Preoperative or Nonsurgical Candidate (Clinical Staging), First Line, Extensive Stage Therapeutic Status: Newly Diagnosed, Preoperative or Nonsurgical Candidate (Clinical Staging) AJCC T Category: cT2a AJCC N Category: cN2 AJCC M Category: cM1c AJCC 8 Stage Grouping: IVB Stage Classification: Extensive Intent of Therapy: Non-Curative / Palliative Intent, Discussed with Patient 

## 2022-06-01 ENCOUNTER — Ambulatory Visit
Admission: RE | Admit: 2022-06-01 | Discharge: 2022-06-01 | Disposition: A | Discharge status: Home / Self Care | Payer: Commercial Managed Care - HMO | Source: Ambulatory Visit | Attending: Internal Medicine | Admitting: Internal Medicine

## 2022-06-01 ENCOUNTER — Inpatient Hospital Stay: Payer: Commercial Managed Care - HMO

## 2022-06-01 ENCOUNTER — Telehealth: Payer: Self-pay | Admitting: Internal Medicine

## 2022-06-01 ENCOUNTER — Other Ambulatory Visit: Payer: Self-pay

## 2022-06-01 ENCOUNTER — Other Ambulatory Visit: Payer: Commercial Managed Care - HMO

## 2022-06-01 VITALS — BP 126/68 | HR 84 | Temp 98.6°F | Resp 18

## 2022-06-01 DIAGNOSIS — C787 Secondary malignant neoplasm of liver and intrahepatic bile duct: Secondary | ICD-10-CM | POA: Insufficient documentation

## 2022-06-01 DIAGNOSIS — R918 Other nonspecific abnormal finding of lung field: Secondary | ICD-10-CM | POA: Diagnosis not present

## 2022-06-01 DIAGNOSIS — C7951 Secondary malignant neoplasm of bone: Secondary | ICD-10-CM | POA: Insufficient documentation

## 2022-06-01 DIAGNOSIS — D7389 Other diseases of spleen: Secondary | ICD-10-CM | POA: Insufficient documentation

## 2022-06-01 DIAGNOSIS — C3431 Malignant neoplasm of lower lobe, right bronchus or lung: Secondary | ICD-10-CM | POA: Diagnosis not present

## 2022-06-01 DIAGNOSIS — C3490 Malignant neoplasm of unspecified part of unspecified bronchus or lung: Secondary | ICD-10-CM

## 2022-06-01 DIAGNOSIS — I700 Atherosclerosis of aorta: Secondary | ICD-10-CM | POA: Diagnosis not present

## 2022-06-01 DIAGNOSIS — R188 Other ascites: Secondary | ICD-10-CM | POA: Diagnosis not present

## 2022-06-01 DIAGNOSIS — K7460 Unspecified cirrhosis of liver: Secondary | ICD-10-CM | POA: Insufficient documentation

## 2022-06-01 DIAGNOSIS — H6691 Otitis media, unspecified, right ear: Secondary | ICD-10-CM

## 2022-06-01 DIAGNOSIS — I6523 Occlusion and stenosis of bilateral carotid arteries: Secondary | ICD-10-CM | POA: Insufficient documentation

## 2022-06-01 LAB — GLUCOSE, CAPILLARY: Glucose-Capillary: 146 mg/dL — ABNORMAL HIGH (ref 70–99)

## 2022-06-01 MED ORDER — SODIUM CHLORIDE 0.9 % IV SOLN
10.0000 mg | Freq: Once | INTRAVENOUS | Status: AC
  Administered 2022-06-01: 10 mg via INTRAVENOUS
  Filled 2022-06-01: qty 10

## 2022-06-01 MED ORDER — SODIUM CHLORIDE 0.9 % IV SOLN: Freq: Once | INTRAVENOUS | Status: AC

## 2022-06-01 MED ORDER — SODIUM CHLORIDE 0.9 % IV SOLN
150.0000 mg | Freq: Once | INTRAVENOUS | Status: AC
  Administered 2022-06-01: 150 mg via INTRAVENOUS
  Filled 2022-06-01: qty 150

## 2022-06-01 MED ORDER — PALONOSETRON HCL INJECTION 0.25 MG/5ML
0.2500 mg | Freq: Once | INTRAVENOUS | Status: AC
  Administered 2022-06-01: 0.25 mg via INTRAVENOUS
  Filled 2022-06-01: qty 5

## 2022-06-01 MED ORDER — SODIUM CHLORIDE 0.9 % IV SOLN
1500.0000 mg | Freq: Once | INTRAVENOUS | Status: AC
  Administered 2022-06-01: 1500 mg via INTRAVENOUS
  Filled 2022-06-01: qty 30

## 2022-06-01 MED ORDER — FLUDEOXYGLUCOSE F - 18 (FDG) INJECTION
10.3000 | Freq: Once | INTRAVENOUS | Status: AC | PRN
  Administered 2022-06-01: 11.09 via INTRAVENOUS

## 2022-06-01 MED ORDER — SODIUM CHLORIDE 0.9 % IV SOLN
50.0000 mg/m2 | Freq: Once | INTRAVENOUS | Status: AC
  Administered 2022-06-01: 100 mg via INTRAVENOUS
  Filled 2022-06-01: qty 5

## 2022-06-01 MED ORDER — SODIUM CHLORIDE 0.9 % IV SOLN
437.2000 mg | Freq: Once | INTRAVENOUS | Status: AC
  Administered 2022-06-01: 450 mg via INTRAVENOUS
  Filled 2022-06-01: qty 45

## 2022-06-01 MED FILL — Dexamethasone Sodium Phosphate Inj 100 MG/10ML: INTRAMUSCULAR | Qty: 1 | Status: AC

## 2022-06-01 NOTE — Patient Instructions (Signed)
[facility]  Discharge Instructions: Thank you for choosing [facility] to provide your oncology and hematology care.   If you have a lab appointment with the [facility], please go directly to the [facility] and check in at the registration area.   Wear comfortable clothing and clothing appropriate for easy access to any Portacath or PICC line.   We strive to give you quality time with your provider. You may need to reschedule your appointment if you arrive late (15 or more minutes).  Arriving late affects you and other patients whose appointments are after yours.  Also, if you miss three or more appointments without notifying the office, you may be dismissed from the clinic at the provider's discretion.      For prescription refill requests, have your pharmacy contact our office and allow 72 hours for refills to be completed.    Today you received the following chemotherapy and/or immunotherapy agents:   Durvalumab, Carboplatin & Etoposide      To help prevent nausea and vomiting after your treatment, we encourage you to take your nausea medication as directed.  BELOW ARE SYMPTOMS THAT SHOULD BE REPORTED IMMEDIATELY: *FEVER GREATER THAN 100.4 F (38 C) OR HIGHER *CHILLS OR SWEATING *NAUSEA AND VOMITING THAT IS NOT CONTROLLED WITH YOUR NAUSEA MEDICATION *UNUSUAL SHORTNESS OF BREATH *UNUSUAL BRUISING OR BLEEDING *URINARY PROBLEMS (pain or burning when urinating, or frequent urination) *BOWEL PROBLEMS (unusual diarrhea, constipation, pain near the anus) TENDERNESS IN MOUTH AND THROAT WITH OR WITHOUT PRESENCE OF ULCERS (sore throat, sores in mouth, or a toothache) UNUSUAL RASH, SWELLING OR PAIN  UNUSUAL VAGINAL DISCHARGE OR ITCHING   Items with * indicate a potential emergency and should be followed up as soon as possible or go to the Emergency Department if any problems should occur.  Please show the CHEMOTHERAPY ALERT CARD or  IMMUNOTHERAPY ALERT CARD at check-in to the Emergency Department and triage nurse.  Should you have questions after your visit or need to cancel or reschedule your appointment, please contact [facility]  Dept: [phone]  and follow the prompts.  Office hours are 8:00 a.m. to 4:30 p.m. Monday - Friday. Please note that voicemails left after 4:00 p.m. may not be returned until the following business day.  We are closed weekends and major holidays. You have access to a nurse at all times for urgent questions. Please call the main number to the clinic Dept: [phone] and follow the prompts.   For any non-urgent questions, you may also contact your provider using MyChart. We now offer e-Visits for anyone 52 and older to request care online for non-urgent symptoms. For details visit [email or web address].   Also download the MyChart app! Go to the app store, search "MyChart", open the app, select [facility], and log in with your MyChart username and password.  Durvalumab Injection What is this medication? DURVALUMAB (dur VAL ue mab) treats some types of cancer. It works by helping your immune system slow or stop the spread of cancer cells. It is a monoclonal antibody. This medicine may be used for other purposes; ask your health care provider or pharmacist if you have questions. COMMON BRAND NAME(S): IMFINZI What should I tell my care team before I take this medication? They need to know if you have any of these conditions: Allogeneic stem cell transplant (uses someone else's stem cells) Autoimmune diseases, such as Crohn disease, ulcerative colitis, lupus History  of chest radiation Nervous system problems, such as Guillain-Barre syndrome, myasthenia gravis Organ transplant An unusual or allergic reaction to durvalumab, other medications, foods, dyes, or preservatives Pregnant or trying to get pregnant Breast-feeding How should I use this  medication? This medication is infused into a vein. It is given by your care team in a hospital or clinic setting. A special MedGuide will be given to you before each treatment. Be sure to read this information carefully each time. Talk to your care team about the use of this medication in children. Special care may be needed. Overdosage: If you think you have taken too much of this medicine contact a poison control center or emergency room at once. NOTE: This medicine is only for you. Do not share this medicine with others. What if I miss a dose? Keep appointments for follow-up doses. It is important not to miss your dose. Call your care team if you are unable to keep an appointment. What may interact with this medication? Interactions have not been studied. This list may not describe all possible interactions. Give your health care provider a list of all the medicines, herbs, non-prescription drugs, or dietary supplements you use. Also tell them if you smoke, drink alcohol, or use illegal drugs. Some items may interact with your medicine. What should I watch for while using this medication? Your condition will be monitored carefully while you are receiving this medication. You may need blood work while taking this medication. This medication may cause serious skin reactions. They can happen weeks to months after starting the medication. Contact your care team right away if you notice fevers or flu-like symptoms with a rash. The rash may be red or purple and then turn into blisters or peeling of the skin. You may also notice a red rash with swelling of the face, lips, or lymph nodes in your neck or under your arms. Tell your care team right away if you have any change in your eyesight. Talk to your care team if you may be pregnant. Serious birth defects can occur if you take this medication during pregnancy and for 3 months after the last dose. You will need a negative pregnancy test before starting  this medication. Contraception is recommended while taking this medication and for 3 months after the last dose. Your care team can help you find the option that works for you. Do not breastfeed while taking this medication and for 3 months after the last dose. What side effects may I notice from receiving this medication? Side effects that you should report to your care team as soon as possible: Allergic reactions--skin rash, itching, hives, swelling of the face, lips, tongue, or throat Dry cough, shortness of breath or trouble breathing Eye pain, redness, irritation, or discharge with blurry or decreased vision Heart muscle inflammation--unusual weakness or fatigue, shortness of breath, chest pain, fast or irregular heartbeat, dizziness, swelling of the ankles, feet, or hands Hormone gland problems--headache, sensitivity to light, unusual weakness or fatigue, dizziness, fast or irregular heartbeat, increased sensitivity to cold or heat, excessive sweating, constipation, hair loss, increased thirst or amount of urine, tremors or shaking, irritability Infusion reactions--chest pain, shortness of breath or trouble breathing, feeling faint or lightheaded Kidney injury (glomerulonephritis)--decrease in the amount of urine, red or dark brown urine, foamy or bubbly urine, swelling of the ankles, hands, or feet Liver injury--right upper belly pain, loss of appetite, nausea, light-colored stool, dark yellow or brown urine, yellowing skin or eyes, unusual weakness or  fatigue Pain, tingling, or numbness in the hands or feet, muscle weakness, change in vision, confusion or trouble speaking, loss of balance or coordination, trouble walking, seizures Rash, fever, and swollen lymph nodes Redness, blistering, peeling, or loosening of the skin, including inside the mouth Sudden or severe stomach pain, bloody diarrhea, fever, nausea, vomiting Side effects that usually do not require medical attention (report these  to your care team if they continue or are bothersome): Bone, joint, or muscle pain Diarrhea Fatigue Loss of appetite Nausea Skin rash This list may not describe all possible side effects. Call your doctor for medical advice about side effects. You may report side effects to FDA at 1-800-FDA-1088. Where should I keep my medication? This medication is given in a hospital or clinic. It will not be stored at home. NOTE: This sheet is a summary. It may not cover all possible information. If you have questions about this medicine, talk to your doctor, pharmacist, or health care provider.   2023 Elsevier/Gold Standard (2021-07-16 00:00:00)  Carboplatin Injection What is this medication? CARBOPLATIN (KAR boe pla tin) treats some types of cancer. It works by slowing down the growth of cancer cells. This medicine may be used for other purposes; ask your health care provider or pharmacist if you have questions. COMMON BRAND NAME(S): Paraplatin What should I tell my care team before I take this medication? They need to know if you have any of these conditions: Blood disorders Hearing problems Kidney disease Recent or ongoing radiation therapy An unusual or allergic reaction to carboplatin, cisplatin, other medications, foods, dyes, or preservatives Pregnant or trying to get pregnant Breast-feeding How should I use this medication? This medication is injected into a vein. It is given by your care team in a hospital or clinic setting. Talk to your care team about the use of this medication in children. Special care may be needed. Overdosage: If you think you have taken too much of this medicine contact a poison control center or emergency room at once. NOTE: This medicine is only for you. Do not share this medicine with others. What if I miss a dose? Keep appointments for follow-up doses. It is important not to miss your dose. Call your care team if you are unable to keep an appointment. What  may interact with this medication? Medications for seizures Some antibiotics, such as amikacin, gentamicin, neomycin, streptomycin, tobramycin Vaccines This list may not describe all possible interactions. Give your health care provider a list of all the medicines, herbs, non-prescription drugs, or dietary supplements you use. Also tell them if you smoke, drink alcohol, or use illegal drugs. Some items may interact with your medicine. What should I watch for while using this medication? Your condition will be monitored carefully while you are receiving this medication. You may need blood work while taking this medication. This medication may make you feel generally unwell. This is not uncommon, as chemotherapy can affect healthy cells as well as cancer cells. Report any side effects. Continue your course of treatment even though you feel ill unless your care team tells you to stop. In some cases, you may be given additional medications to help with side effects. Follow all directions for their use. This medication may increase your risk of getting an infection. Call your care team for advice if you get a fever, chills, sore throat, or other symptoms of a cold or flu. Do not treat yourself. Try to avoid being around people who are sick. Avoid taking medications  that contain aspirin, acetaminophen, ibuprofen, naproxen, or ketoprofen unless instructed by your care team. These medications may hide a fever. Be careful brushing or flossing your teeth or using a toothpick because you may get an infection or bleed more easily. If you have any dental work done, tell your dentist you are receiving this medication. Talk to your care team if you wish to become pregnant or think you might be pregnant. This medication can cause serious birth defects. Talk to your care team about effective forms of contraception. Do not breast-feed while taking this medication. What side effects may I notice from receiving this  medication? Side effects that you should report to your care team as soon as possible: Allergic reactions--skin rash, itching, hives, swelling of the face, lips, tongue, or throat Infection--fever, chills, cough, sore throat, wounds that don't heal, pain or trouble when passing urine, general feeling of discomfort or being unwell Low red blood cell level--unusual weakness or fatigue, dizziness, headache, trouble breathing Pain, tingling, or numbness in the hands or feet, muscle weakness, change in vision, confusion or trouble speaking, loss of balance or coordination, trouble walking, seizures Unusual bruising or bleeding Side effects that usually do not require medical attention (report to your care team if they continue or are bothersome): Hair loss Nausea Unusual weakness or fatigue Vomiting This list may not describe all possible side effects. Call your doctor for medical advice about side effects. You may report side effects to FDA at 1-800-FDA-1088. Where should I keep my medication? This medication is given in a hospital or clinic. It will not be stored at home. NOTE: This sheet is a summary. It may not cover all possible information. If you have questions about this medicine, talk to your doctor, pharmacist, or health care provider.  2023 Elsevier/Gold Standard (2007-05-05 00:00:00)  Etoposide Injection What is this medication? ETOPOSIDE (e toe POE side) treats some types of cancer. It works by slowing down the growth of cancer cells. This medicine may be used for other purposes; ask your health care provider or pharmacist if you have questions. COMMON BRAND NAME(S): Etopophos, Toposar, VePesid What should I tell my care team before I take this medication? They need to know if you have any of these conditions: Infection Kidney disease Liver disease Low blood counts, such as low white cell, platelet, red cell counts An unusual or allergic reaction to etoposide, other medications,  foods, dyes, or preservatives If you or your partner are pregnant or trying to get pregnant Breastfeeding How should I use this medication? This medication is injected into a vein. It is given by your care team in a hospital or clinic setting. Talk to your care team about the use of this medication in children. Special care may be needed. Overdosage: If you think you have taken too much of this medicine contact a poison control center or emergency room at once. NOTE: This medicine is only for you. Do not share this medicine with others. What if I miss a dose? Keep appointments for follow-up doses. It is important not to miss your dose. Call your care team if you are unable to keep an appointment. What may interact with this medication? Warfarin This list may not describe all possible interactions. Give your health care provider a list of all the medicines, herbs, non-prescription drugs, or dietary supplements you use. Also tell them if you smoke, drink alcohol, or use illegal drugs. Some items may interact with your medicine. What should I watch for while  using this medication? Your condition will be monitored carefully while you are receiving this medication. This medication may make you feel generally unwell. This is not uncommon as chemotherapy can affect healthy cells as well as cancer cells. Report any side effects. Continue your course of treatment even though you feel ill unless your care team tells you to stop. This medication can cause serious side effects. To reduce the risk, your care team may give you other medications to take before receiving this one. Be sure to follow the directions from your care team. This medication may increase your risk of getting an infection. Call your care team for advice if you get a fever, chills, sore throat, or other symptoms of a cold or flu. Do not treat yourself. Try to avoid being around people who are sick. This medication may increase your risk to  bruise or bleed. Call your care team if you notice any unusual bleeding. Talk to your care team about your risk of cancer. You may be more at risk for certain types of cancers if you take this medication. Talk to your care team if you may be pregnant. Serious birth defects can occur if you take this medication during pregnancy and for 6 months after the last dose. You will need a negative pregnancy test before starting this medication. Contraception is recommended while taking this medication and for 6 months after the last dose. Your care team can help you find the option that works for you. If your partner can get pregnant, use a condom during sex while taking this medication and for 4 months after the last dose. Do not breastfeed while taking this medication. This medication may cause infertility. Talk to your care team if you are concerned about your fertility. What side effects may I notice from receiving this medication? Side effects that you should report to your care team as soon as possible: Allergic reactions--skin rash, itching, hives, swelling of the face, lips, tongue, or throat Infection--fever, chills, cough, sore throat, wounds that don't heal, pain or trouble when passing urine, general feeling of discomfort or being unwell Low red blood cell level--unusual weakness or fatigue, dizziness, headache, trouble breathing Unusual bruising or bleeding Side effects that usually do not require medical attention (report to your care team if they continue or are bothersome): Diarrhea Fatigue Hair loss Loss of appetite Nausea Vomiting This list may not describe all possible side effects. Call your doctor for medical advice about side effects. You may report side effects to FDA at 1-800-FDA-1088. Where should I keep my medication? This medication is given in a hospital or clinic. It will not be stored at home. NOTE: This sheet is a summary. It may not cover all possible information. If you  have questions about this medicine, talk to your doctor, pharmacist, or health care provider.  2023 Elsevier/Gold Standard (2007-05-05 00:00:00)

## 2022-06-01 NOTE — Telephone Encounter (Signed)
Called patient regarding March appointments, patient is notified.

## 2022-06-02 ENCOUNTER — Other Ambulatory Visit: Payer: Self-pay

## 2022-06-02 ENCOUNTER — Inpatient Hospital Stay: Payer: Commercial Managed Care - HMO

## 2022-06-02 VITALS — BP 130/65 | HR 78 | Temp 98.1°F | Resp 18 | Ht 65.0 in | Wt 192.5 lb

## 2022-06-02 DIAGNOSIS — C3431 Malignant neoplasm of lower lobe, right bronchus or lung: Secondary | ICD-10-CM | POA: Diagnosis not present

## 2022-06-02 DIAGNOSIS — C3490 Malignant neoplasm of unspecified part of unspecified bronchus or lung: Secondary | ICD-10-CM

## 2022-06-02 MED ORDER — SODIUM CHLORIDE 0.9 % IV SOLN
10.0000 mg | Freq: Once | INTRAVENOUS | Status: AC
  Administered 2022-06-02: 10 mg via INTRAVENOUS
  Filled 2022-06-02: qty 10

## 2022-06-02 MED ORDER — SODIUM CHLORIDE 0.9 % IV SOLN
50.0000 mg/m2 | Freq: Once | INTRAVENOUS | Status: AC
  Administered 2022-06-02: 100 mg via INTRAVENOUS
  Filled 2022-06-02: qty 5

## 2022-06-02 MED ORDER — SODIUM CHLORIDE 0.9 % IV SOLN: Freq: Once | INTRAVENOUS | Status: AC

## 2022-06-02 MED FILL — Dexamethasone Sodium Phosphate Inj 100 MG/10ML: INTRAMUSCULAR | Qty: 1 | Status: AC

## 2022-06-02 NOTE — Progress Notes (Signed)
Etoposide diluted in NS 560m, Lot: [id number] Exp:07/26/2023.  PRaul DelLVan Vleck [facility], BCOP 06/02/2022 10:22 AM

## 2022-06-02 NOTE — Patient Instructions (Signed)
[facility]  Discharge Instructions: Thank you for choosing [facility] to provide your oncology and hematology care.   If you have a lab appointment with the [facility], please go directly to the [facility] and check in at the registration area.   Wear comfortable clothing and clothing appropriate for easy access to any Portacath or PICC line.   We strive to give you quality time with your provider. You may need to reschedule your appointment if you arrive late (15 or more minutes).  Arriving late affects you and other patients whose appointments are after yours.  Also, if you miss three or more appointments without notifying the office, you may be dismissed from the clinic at the provider's discretion.      For prescription refill requests, have your pharmacy contact our office and allow 72 hours for refills to be completed.    Today you received the following chemotherapy and/or immunotherapy agent: Etoposide   To help prevent nausea and vomiting after your treatment, we encourage you to take your nausea medication as directed.  BELOW ARE SYMPTOMS THAT SHOULD BE REPORTED IMMEDIATELY: *FEVER GREATER THAN 100.4 F (38 C) OR HIGHER *CHILLS OR SWEATING *NAUSEA AND VOMITING THAT IS NOT CONTROLLED WITH YOUR NAUSEA MEDICATION *UNUSUAL SHORTNESS OF BREATH *UNUSUAL BRUISING OR BLEEDING *URINARY PROBLEMS (pain or burning when urinating, or frequent urination) *BOWEL PROBLEMS (unusual diarrhea, constipation, pain near the anus) TENDERNESS IN MOUTH AND THROAT WITH OR WITHOUT PRESENCE OF ULCERS (sore throat, sores in mouth, or a toothache) UNUSUAL RASH, SWELLING OR PAIN  UNUSUAL VAGINAL DISCHARGE OR ITCHING   Items with * indicate a potential emergency and should be followed up as soon as possible or go to the Emergency Department if any problems should occur.  Please show the CHEMOTHERAPY ALERT CARD or IMMUNOTHERAPY ALERT CARD at check-in  to the Emergency Department and triage nurse.  Should you have questions after your visit or need to cancel or reschedule your appointment, please contact [facility]  Dept: [phone]  and follow the prompts.  Office hours are 8:00 a.m. to 4:30 p.m. Monday - Friday. Please note that voicemails left after 4:00 p.m. may not be returned until the following business day.  We are closed weekends and major holidays. You have access to a nurse at all times for urgent questions. Please call the main number to the clinic Dept: [phone] and follow the prompts.   For any non-urgent questions, you may also contact your provider using MyChart. We now offer e-Visits for anyone 1 and older to request care online for non-urgent symptoms. For details visit [email or web address].   Also download the MyChart app! Go to the app store, search "MyChart", open the app, select [facility], and log in with your MyChart username and password.  Etoposide Injection What is this medication? ETOPOSIDE (e toe POE side) treats some types of cancer. It works by slowing down the growth of cancer cells. This medicine may be used for other purposes; ask your health care provider or pharmacist if you have questions. COMMON BRAND NAME(S): Etopophos, Toposar, VePesid What should I tell my care team before I take this medication? They need to know if you have any of these conditions: Infection Kidney disease Liver disease Low blood counts, such as low white cell, platelet, red cell counts An unusual or allergic reaction to etoposide, other medications, foods, dyes, or preservatives If you or your partner  are pregnant or trying to get pregnant Breastfeeding How should I use this medication? This medication is injected into a vein. It is given by your care team in a hospital or clinic setting. Talk to your care team about the use of this medication in children. Special care may be  needed. Overdosage: If you think you have taken too much of this medicine contact a poison control center or emergency room at once. NOTE: This medicine is only for you. Do not share this medicine with others. What if I miss a dose? Keep appointments for follow-up doses. It is important not to miss your dose. Call your care team if you are unable to keep an appointment. What may interact with this medication? Warfarin This list may not describe all possible interactions. Give your health care provider a list of all the medicines, herbs, non-prescription drugs, or dietary supplements you use. Also tell them if you smoke, drink alcohol, or use illegal drugs. Some items may interact with your medicine. What should I watch for while using this medication? Your condition will be monitored carefully while you are receiving this medication. This medication may make you feel generally unwell. This is not uncommon as chemotherapy can affect healthy cells as well as cancer cells. Report any side effects. Continue your course of treatment even though you feel ill unless your care team tells you to stop. This medication can cause serious side effects. To reduce the risk, your care team may give you other medications to take before receiving this one. Be sure to follow the directions from your care team. This medication may increase your risk of getting an infection. Call your care team for advice if you get a fever, chills, sore throat, or other symptoms of a cold or flu. Do not treat yourself. Try to avoid being around people who are sick. This medication may increase your risk to bruise or bleed. Call your care team if you notice any unusual bleeding. Talk to your care team about your risk of cancer. You may be more at risk for certain types of cancers if you take this medication. Talk to your care team if you may be pregnant. Serious birth defects can occur if you take this medication during pregnancy and for  6 months after the last dose. You will need a negative pregnancy test before starting this medication. Contraception is recommended while taking this medication and for 6 months after the last dose. Your care team can help you find the option that works for you. If your partner can get pregnant, use a condom during sex while taking this medication and for 4 months after the last dose. Do not breastfeed while taking this medication. This medication may cause infertility. Talk to your care team if you are concerned about your fertility. What side effects may I notice from receiving this medication? Side effects that you should report to your care team as soon as possible: Allergic reactions--skin rash, itching, hives, swelling of the face, lips, tongue, or throat Infection--fever, chills, cough, sore throat, wounds that don't heal, pain or trouble when passing urine, general feeling of discomfort or being unwell Low red blood cell level--unusual weakness or fatigue, dizziness, headache, trouble breathing Unusual bruising or bleeding Side effects that usually do not require medical attention (report to your care team if they continue or are bothersome): Diarrhea Fatigue Hair loss Loss of appetite Nausea Vomiting This list may not describe all possible side effects. Call your doctor for medical  advice about side effects. You may report side effects to FDA at 1-800-FDA-1088. Where should I keep my medication? This medication is given in a hospital or clinic. It will not be stored at home. NOTE: This sheet is a summary. It may not cover all possible information. If you have questions about this medicine, talk to your doctor, pharmacist, or health care provider.  2023 Elsevier/Gold Standard (2007-05-05 00:00:00)

## 2022-06-02 NOTE — Progress Notes (Signed)
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

## 2022-06-03 ENCOUNTER — Inpatient Hospital Stay: Payer: Commercial Managed Care - HMO

## 2022-06-03 ENCOUNTER — Other Ambulatory Visit: Payer: Self-pay | Admitting: Physician Assistant

## 2022-06-03 ENCOUNTER — Ambulatory Visit (HOSPITAL_COMMUNITY)
Admission: RE | Admit: 2022-06-03 | Discharge: 2022-06-03 | Disposition: A | Discharge status: Home / Self Care | Payer: Commercial Managed Care - HMO | Source: Ambulatory Visit | Attending: Physician Assistant | Admitting: Physician Assistant

## 2022-06-03 VITALS — BP 125/70 | HR 73 | Temp 97.8°F | Resp 17

## 2022-06-03 DIAGNOSIS — C3490 Malignant neoplasm of unspecified part of unspecified bronchus or lung: Secondary | ICD-10-CM | POA: Insufficient documentation

## 2022-06-03 DIAGNOSIS — C3431 Malignant neoplasm of lower lobe, right bronchus or lung: Secondary | ICD-10-CM | POA: Diagnosis not present

## 2022-06-03 MED ORDER — SODIUM CHLORIDE 0.9 % IV SOLN: Freq: Once | INTRAVENOUS | Status: AC

## 2022-06-03 MED ORDER — SODIUM CHLORIDE 0.9 % IV SOLN
10.0000 mg | Freq: Once | INTRAVENOUS | Status: AC
  Administered 2022-06-03: 10 mg via INTRAVENOUS
  Filled 2022-06-03: qty 10

## 2022-06-03 MED ORDER — SODIUM CHLORIDE 0.9 % IV SOLN
50.0000 mg/m2 | Freq: Once | INTRAVENOUS | Status: AC
  Administered 2022-06-03: 100 mg via INTRAVENOUS
  Filled 2022-06-03: qty 5

## 2022-06-03 NOTE — Progress Notes (Signed)
Patient presents for therapeutic paracentesis. [place] limited abd shows small  amount of peritoneal fluid noted. Patient declined paracentesis at this time due to limited therapeutic value. Procedure not performed.

## 2022-06-03 NOTE — Progress Notes (Signed)
Per note by Dr Earlie Server ok to proceed with treatment with elevated labs: bili 17.1, AST 115, ALT 292

## 2022-06-05 ENCOUNTER — Ambulatory Visit: Payer: Commercial Managed Care - HMO

## 2022-06-06 ENCOUNTER — Institutional Professional Consult (permissible substitution): Payer: Commercial Managed Care - HMO | Admitting: Pulmonary Disease

## 2022-06-06 ENCOUNTER — Inpatient Hospital Stay: Payer: Commercial Managed Care - HMO

## 2022-06-06 VITALS — BP 108/55 | HR 75 | Temp 98.5°F | Resp 20

## 2022-06-06 DIAGNOSIS — C3431 Malignant neoplasm of lower lobe, right bronchus or lung: Secondary | ICD-10-CM | POA: Diagnosis not present

## 2022-06-06 DIAGNOSIS — C3490 Malignant neoplasm of unspecified part of unspecified bronchus or lung: Secondary | ICD-10-CM

## 2022-06-06 MED ORDER — PEGFILGRASTIM-CBQV 6 MG/0.6ML ~~LOC~~ SOSY
6.0000 mg | PREFILLED_SYRINGE | Freq: Once | SUBCUTANEOUS | Status: AC
  Administered 2022-06-06: 6 mg via SUBCUTANEOUS
  Filled 2022-06-06: qty 0.6

## 2022-06-06 NOTE — Patient Instructions (Signed)

## 2022-06-07 ENCOUNTER — Ambulatory Visit: Payer: Commercial Managed Care - HMO | Admitting: Internal Medicine

## 2022-06-07 ENCOUNTER — Other Ambulatory Visit: Payer: Self-pay | Admitting: Physician Assistant

## 2022-06-07 ENCOUNTER — Other Ambulatory Visit: Payer: Commercial Managed Care - HMO

## 2022-06-07 DIAGNOSIS — C3490 Malignant neoplasm of unspecified part of unspecified bronchus or lung: Secondary | ICD-10-CM

## 2022-06-07 NOTE — Progress Notes (Signed)
This navigator called the patient to see how he is feeling after receiving his first dose of chemotherapy on 3/6. Carbo/Etoposide/Darvalumab. Pt states he feels all right, had 1 episode of diarrhea for which he took imodium and has not had diarrhea since. He has a cough with occasional sputum and says "it feels better when I cough". Pt denies fevers. He received a dose of Neulasta yesterday. The patient is scheduled for a port placement this Thursday. Pt reminded of date and time. Pt states he plans on going to the beach this weekend.  This navigator will follow up with the patient on 3/15 after his port placement.

## 2022-06-07 NOTE — Progress Notes (Addendum)
[facility] OFFICE PROGRESS NOTE  Matthew [name], Matthew [name] [place] [id number]  DIAGNOSIS: Stage IV (T2a, N2, M1 C) small cell lung cancer, he presented with a RLL lung mass with subcarinal and ipsilateral mediastinal lymphadenopathy and diffuse hepatic metastatic disease, splenic metastatic disease, and osseous metastatic disease. Diagnosed in February 2024.    PRIOR THERAPY: None  CURRENT THERAPY: Palliative systemic chemotherapy with carboplatin for an AUC of 4 and reduced dose etoposide 50 mg/m on days 1, 2, and 3 with Imfinzi 1500 mg IV every 3 weeks with neulasta. First dose on 06/01/22  INTERVAL HISTORY: Matthew [name] 62 y.o. male returns to the clinic today for a follow-up visit.  The patient was first seen in the outpatient setting on 05/31/2022.  At that point in time, the patient was found to have significant disease burden of his extensive stage small cell lung cancer with diffuse hepatic involvement with significantly elevated LFTs and bilirubin of 17.1.  He urgently started systemic chemotherapy with dose reduced chemotherapy the following day on 06/01/2022.  Overall, he tolerated the first cycle of treatment well without any adverse side effects. He appears more jaundice to me today but he has not noticed. He is hoping to go to the beach this weekend. The patient has been endorsing abdominal distention likely due to his metastatic disease in his abdomen/liver.  There is no significant ascites to be drawn off via paracentesis when checked with an ultrasound last week.  He thinks overall his abdomen may be slightly less distended. He continues to have dark urine. Otherwise he denies any fever, chills, or night sweats.  He has been coughing a little bit more the last 3-4 days. He has been taking mucinex. Denies any chest pain or hemoptysis.  He continues to have slight dyspnea on exertion which he feels is the same.  Denies any nausea, vomiting, or  constipation. He had some mild diarrhea after the first infusion which improved with imodium.  He is scheduled for Port-A-Cath placement on 06/09/2022.  The patient is here today for evaluation repeat blood work and for 1 week follow-up visit to manage any adverse side effects of treatment.   MEDICAL HISTORY: Past Medical History:  Diagnosis Date   Acute bronchitis 03/30/2017   Acute maxillary sinusitis 07/25/2017   Acute otitis media, right 05/05/2016   Acute respiratory failure with hypoxia ([facility]) 03/30/2017   CAP (community acquired pneumonia) 03/31/2017   COPD (chronic obstructive pulmonary disease) ([facility])    Diarrhea 10/16/2017   Diverticulitis    Ear build-up, left 08/10/2017   Hypertension    Left otitis media 07/17/2017   Pharyngitis 05/05/2016   Renal artery stenosis ([facility])    1 to 59% bilateral by Dopplers 12/2020   Sigmoid diverticulitis 02/11/2018    ALLERGIES:  has No Known Allergies.  MEDICATIONS:  Current Outpatient Medications  Medication Sig Dispense Refill   benzonatate (TESSALON) 100 MG capsule Take 1 capsule (100 mg total) by mouth 3 (three) times daily as needed for cough. 30 capsule 2   amLODipine (NORVASC) 10 MG tablet TAKE 1 TABLET BY MOUTH DAILY (Patient taking differently: Take 10 mg by mouth at bedtime.) 90 tablet 0   aspirin EC 81 MG tablet Take 1 tablet (81 mg total) by mouth daily. Swallow whole. 90 tablet 3   famotidine-calcium carbonate-magnesium hydroxide (PEPCID COMPLETE) 10-800-165 MG chewable tablet Chew 1 tablet by mouth daily as needed (indigestion, heartburn).     furosemide (LASIX) 20  MG tablet Take 1 tablet (20 mg total) by mouth daily. 30 tablet 2   ibuprofen (ADVIL) 200 MG tablet Take 600 mg by mouth daily as needed for headache, mild pain or moderate pain.     lidocaine-prilocaine (EMLA) cream Apply 1 Application topically as needed. 30 g 2   LORazepam (ATIVAN) 0.5 MG tablet Take 1 tablet (0.5 mg total) by mouth every 6 (six) hours as needed  for anxiety. 30 tablet 2   metoprolol succinate (TOPROL-XL) 100 MG 24 hr tablet Take 1 tablet (100 mg total) by mouth daily. (Patient taking differently: Take 100 mg by mouth at bedtime.) 90 tablet 3   nitroGLYCERIN (NITROSTAT) 0.4 MG SL tablet Place 1 tablet (0.4 mg total) under the tongue every 5 (five) minutes as needed for chest pain. 25 tablet 3   omeprazole (PRILOSEC OTC) 20 MG tablet Take 20 mg by mouth daily as needed (indigestion, heartburn).     prochlorperazine (COMPAZINE) 10 MG tablet Take 1 tablet (10 mg total) by mouth every 6 (six) hours as needed. 30 tablet 2   traZODone (DESYREL) 50 MG tablet Take 1 tablet (50 mg total) by mouth at bedtime. 30 tablet 2   No current facility-administered medications for this visit.    SURGICAL HISTORY:  Past Surgical History:  Procedure Laterality Date   FINE NEEDLE ASPIRATION  05/17/2022   Procedure: FINE NEEDLE ASPIRATION (FNA) LINEAR;  Surgeon: Garner Nash, DO;  Location: [facility];  Service: Cardiopulmonary;;   HERNIA REPAIR     HERNIA REPAIR  1966   VIDEO BRONCHOSCOPY WITH ENDOBRONCHIAL ULTRASOUND N/A 05/17/2022   Procedure: VIDEO BRONCHOSCOPY WITH ENDOBRONCHIAL ULTRASOUND;  Surgeon: Garner Nash, DO;  Location: [facility];  Service: Cardiopulmonary;  Laterality: N/A;    REVIEW OF SYSTEMS:   Review of Systems  Constitutional: Positive for stable fatigue. Negative for appetite change, chills,  fever and unexpected weight change.  HENT:   Negative for mouth sores, nosebleeds, sore throat and trouble swallowing.   Eyes: Positive for jaundice. Positive for scleral icterus.    Respiratory: Positive for stable shortness of breath. Negative for cough, hemoptysis, and wheezing.   Cardiovascular: Negative for chest pain and leg swelling.  Gastrointestinal: Positive for abdominal distention. Improved diarrhea. Negative for constipation, diarrhea (resolved), nausea and vomiting.  Genitourinary: Positive for dark urine. Negative for  bladder incontinence, difficulty urinating, dysuria, frequency and hematuria.   Musculoskeletal: Negative for back pain, gait problem, neck pain and neck stiffness.  Skin: Positive for jaundice. Negative for itching and rash.  Neurological: Negative for dizziness, extremity weakness, gait problem, headaches, light-headedness and seizures.  Hematological: Negative for adenopathy. Does not bruise/bleed easily.  Psychiatric/Behavioral: Negative for confusion, depression and sleep disturbance. The patient is not nervous/anxious.     PHYSICAL EXAMINATION:  Blood pressure 114/69, pulse 78, temperature 97.6 F (36.4 C), temperature source Temporal, resp. rate 15, weight 186 lb 3.2 oz (84.5 kg), SpO2 94 %.  ECOG PERFORMANCE STATUS: 1  Physical Exam  Constitutional: Oriented to person, place, and time and well-developed, well-nourished, and in no distress.  HENT:  Head: Normocephalic and atraumatic.  Mouth/Throat: Oropharynx is clear and moist. No oropharyngeal exudate.  Eyes: Positive for scleral icterus.  Conjunctivae are normal. Right eye exhibits no discharge. Left eye exhibits no discharge.  Neck: Normal range of motion. Neck supple.  Cardiovascular: Normal rate, regular rhythm, normal heart sounds and intact distal pulses.   Pulmonary/Chest: Effort normal and breath sounds normal. No respiratory distress. No wheezes. No rales.  Abdominal: Soft.  Positive for abdominal bloating/distention.  Bowel sounds are normal.  There is no tenderness.  Musculoskeletal: Normal range of motion. Exhibits no edema.  Lymphadenopathy:    No cervical adenopathy.  Neurological: Alert and oriented to person, place, and time. Exhibits normal muscle tone. Gait normal. Coordination normal.  Skin: Positive for jaundice.  Skin is warm and dry. No rash noted. Not diaphoretic. No erythema. No pallor.  Psychiatric: Mood, memory and judgment normal.  Vitals reviewed.  LABORATORY DATA: Lab Results  Component Value  Date   WBC 3.1 (L) 06/09/2022   HGB 8.0 (L) 06/09/2022   HCT 23.1 (L) 06/09/2022   MCV 87.8 06/09/2022   PLT 82 (L) 06/09/2022      Chemistry      Component Value Date/Time   NA 138 06/09/2022 0718   NA 134 05/31/2021 1633   K 4.2 06/09/2022 0718   CL 99 06/09/2022 0718   CO2 32 06/09/2022 0718   BUN 16 06/09/2022 0718   BUN 14 05/31/2021 1633   CREATININE 0.91 06/09/2022 0718      Component Value Date/Time   CALCIUM 7.4 (L) 06/09/2022 0718   ALKPHOS 504 (H) 06/09/2022 0718   AST 83 (H) 06/09/2022 0718   ALT 207 (H) 06/09/2022 0718   BILITOT 26.0 (HH) 06/09/2022 0718       RADIOGRAPHIC STUDIES:  [place] ASCITES (ABDOMEN LIMITED)  Result Date: 06/03/2022 CLINICAL DATA:  Abdominal distention. He feels that he may have ascites, but we are unclear. Please do ultrasound, and if no significant fluid, then don't proceed EXAM: LIMITED ABDOMEN ULTRASOUND FOR ASCITES TECHNIQUE: Limited ultrasound survey for ascites was performed in all four abdominal quadrants. COMPARISON:  CT AP, 05/16/2022. FINDINGS: Focused ultrasound along the abdominal quadrants. Trace peritoneal fluid at the RIGHT lower quadrant. No significant volume for safe paracentesis. Procedure was not performed. Performed by Rushie Nyhan, IR NP IMPRESSION: Trace peritoneal fluid of the RIGHT lower quadrant. No significant volume for paracentesis. Procedure was not performed. Michaelle Birks, MD [facility] Electronically Signed   By: Michaelle Birks M.D.   On: 06/03/2022 18:48   NM PET Image Initial (PI) Skull Base To Thigh (F-18 FDG)  Result Date: 06/02/2022 CLINICAL DATA:  Initial treatment strategy for non-small cell lung cancer. Widespread metastatic disease on imaging. EXAM: NUCLEAR MEDICINE PET SKULL BASE TO THIGH TECHNIQUE: 11.1 mCi F-18 FDG was injected intravenously. Full-ring PET imaging was performed from the skull base to thigh after the radiotracer. CT data was  obtained and used for attenuation correction and anatomic localization. Fasting blood glucose: 146 mg/dl COMPARISON:  Chest CTA and abdominopelvic CT 05/16/2022. Abdominal MRI 05/18/2022. FINDINGS: Mediastinal blood pool activity: SUV max 1.8 NECK: No hypermetabolic cervical lymph nodes are identified.Fairly symmetric activity within the lymphoid tissue of Waldeyer's ring is within physiologic limits.No suspicious activity identified within the pharyngeal mucosal space. Incidental CT findings: Bilateral carotid atherosclerosis. CHEST: There are hypermetabolic and enlarged right paratracheal, subcarinal and right hilar lymph nodes. Subcarinal node has a short axis dimension of 2.0 cm and SUV max of 5.8. Irregularly lobulated right lower lobe mass measuring 3.7 x 3.0 cm on image [id number] is hypermetabolic with an SUV max of 4.7. No other hypermetabolic pulmonary activity or suspicious nodularity. Incidental CT findings: Mild linear atelectasis or scarring at the left lung base. Mild atherosclerosis of the aorta, great vessels and coronary arteries. ABDOMEN/PELVIS: The liver is enlarged, measuring 30.3 cm transverse. There are underlying cirrhotic changes with innumerable  hypermetabolic lesions consistent with widespread metastatic disease. Confluent lesions in the left lobe evidence UV max of 7.1. The medial splenic lesion seen on MRI is hypermetabolic (SUV max 5.3). No evidence of metastatic disease within the adrenal glands or pancreas. There is no hypermetabolic nodal activity in the abdomen or pelvis. Bowel activity within physiologic limits. Small amount of ascites without associated hypermetabolic activity to suggest carcinomatosis. Incidental CT findings: Contracted gallbladder with small gallstones. Diffuse aortic and branch vessel atherosclerosis. As above, ascites without definite evidence of carcinomatosis. There are diverticular changes in the distal colon. SKELETON: Widespread heterogeneous metabolic activity  throughout the bones consistent with metastatic disease. There are multiple lesions throughout the spine, bony pelvis, sternum, ribs and both proximal humeri. No evidence of pathologic fracture. Incidental CT findings: none IMPRESSION: 1. Hypermetabolic right lower lobe pulmonary mass consistent with primary bronchogenic carcinoma. 2. Hypermetabolic nodal metastases in the right hilum and mediastinum. 3. Widespread hypermetabolic hepatic metastatic disease with underlying cirrhosis. 4. Hypermetabolic splenic lesion consistent with a metastasis. 5. Widespread hypermetabolic osseous metastatic disease. 6. Ascites without definite signs of peritoneal carcinomatosis. 7.  Aortic Atherosclerosis (ICD10-I70.0). Electronically Signed   By: Richardean Sale M.D.   On: 06/02/2022 15:18   MR ABDOMEN MRCP W WO CONTAST  Result Date: 05/18/2022 CLINICAL DATA:  Inpatient. Right lower lobe lung mass with mediastinal adenopathy suspicious for primary lung malignancy on recent chest CT angiogram. Evaluate abnormal liver and spleen suspicious for metastatic disease on recent CT abdomen/pelvis. Jaundice. EXAM: MRI ABDOMEN WITHOUT AND WITH CONTRAST (INCLUDING MRCP) TECHNIQUE: Multiplanar multisequence MR imaging of the abdomen was performed both before and after the administration of intravenous contrast. Heavily T2-weighted images of the biliary and pancreatic ducts were obtained, and three-dimensional MRCP images were rendered by post processing. CONTRAST:  16m GADAVIST GADOBUTROL 1 MMOL/ML IV SOLN COMPARISON:  05/16/2022 chest CT angiogram and CT abdomen/pelvis. FINDINGS: Lower chest: Partially visualized irregular solid 4.2 cm right lower lobe lung mass (series 4/image 1). Partially visualized subcarinal lymphadenopathy on series 5/image 16). Hepatobiliary: Hepatomegaly. Liver is nearly completely replaced by innumerable (> than 50) confluent masses with targetoid enhancement, all new since 02/11/2018 CT. Representative 6.3 x  5.0 cm segment 3 left liver mass (series 4/image 20). Representative 4.2 x 3.8 cm segment 4A left liver mass (series 4/image 10). Representative 3.5 x 3.0 cm segment 5 right liver mass (series 4/image 21). Contracted gallbladder with no cholelithiasis. No biliary ductal dilatation. Common bile duct diameter 2 mm. No choledocholithiasis. No biliary masses, strictures or beading. Pancreas: No pancreatic mass or duct dilation.  No pancreas divisum. Spleen: Normal size spleen. Posterior splenic 2.2 x 2.2 cm mass (series 4/image 13) with heterogeneous hypoenhancement, new from 02/11/2018 CT. Adrenals/Urinary Tract: Normal adrenals. No hydronephrosis. Normal kidneys with no renal mass. Enhancing 0.7 cm solid left retroperitoneal nodule inferior to lower left renal pole (series 8/image 31). Stomach/Bowel: Normal non-distended stomach. Visualized small and large bowel is normal caliber, with no bowel wall thickening. Vascular/Lymphatic: Atherosclerotic nonaneurysmal abdominal aorta. Patent portal, splenic, hepatic and renal veins. No pathologically enlarged lymph nodes in the abdomen. Other: No abdominal ascites or focal fluid collection. Musculoskeletal: Widespread patchy enhancing bone lesions throughout lumbar vertebral bodies and visualized bilateral pelvic girdle, for example a 2.2 cm in the medial right iliac bone (series 28/image 12) and 2.1 cm in left L3 vertebral body (series 28/image 25). IMPRESSION: 1. Hepatomegaly. Liver is nearly completely replaced by innumerable confluent masses with targetoid enhancement, all new since 02/11/2018 CT, compatible with widespread  hepatic metastatic disease. 2. Widespread patchy enhancing bone lesions throughout the lumbar vertebral bodies and visualized bilateral pelvic girdle, compatible with widespread osseous metastatic disease. 3. Enhancing 0.7 cm solid left retroperitoneal nodule inferior to lower left renal pole, potentially a small retroperitoneal metastasis. 4.  Hypoenhancing solitary splenic metastasis. 5. Partially visualized solid 4.2 cm right lower lobe lung mass and partially visualized subcarinal lymphadenopathy, compatible with presumed primary lung malignancy and metastatic thoracic adenopathy. 6. No biliary ductal dilatation. No cholelithiasis. Electronically Signed   By: Ilona Sorrel M.D.   On: 05/18/2022 10:07   MR 3D Recon At Scanner  Result Date: 05/18/2022 CLINICAL DATA:  Inpatient. Right lower lobe lung mass with mediastinal adenopathy suspicious for primary lung malignancy on recent chest CT angiogram. Evaluate abnormal liver and spleen suspicious for metastatic disease on recent CT abdomen/pelvis. Jaundice. EXAM: MRI ABDOMEN WITHOUT AND WITH CONTRAST (INCLUDING MRCP) TECHNIQUE: Multiplanar multisequence MR imaging of the abdomen was performed both before and after the administration of intravenous contrast. Heavily T2-weighted images of the biliary and pancreatic ducts were obtained, and three-dimensional MRCP images were rendered by post processing. CONTRAST:  37m GADAVIST GADOBUTROL 1 MMOL/ML IV SOLN COMPARISON:  05/16/2022 chest CT angiogram and CT abdomen/pelvis. FINDINGS: Lower chest: Partially visualized irregular solid 4.2 cm right lower lobe lung mass (series 4/image 1). Partially visualized subcarinal lymphadenopathy on series 5/image 16). Hepatobiliary: Hepatomegaly. Liver is nearly completely replaced by innumerable (> than 50) confluent masses with targetoid enhancement, all new since 02/11/2018 CT. Representative 6.3 x 5.0 cm segment 3 left liver mass (series 4/image 20). Representative 4.2 x 3.8 cm segment 4A left liver mass (series 4/image 10). Representative 3.5 x 3.0 cm segment 5 right liver mass (series 4/image 21). Contracted gallbladder with no cholelithiasis. No biliary ductal dilatation. Common bile duct diameter 2 mm. No choledocholithiasis. No biliary masses, strictures or beading. Pancreas: No pancreatic mass or duct dilation.   No pancreas divisum. Spleen: Normal size spleen. Posterior splenic 2.2 x 2.2 cm mass (series 4/image 13) with heterogeneous hypoenhancement, new from 02/11/2018 CT. Adrenals/Urinary Tract: Normal adrenals. No hydronephrosis. Normal kidneys with no renal mass. Enhancing 0.7 cm solid left retroperitoneal nodule inferior to lower left renal pole (series 8/image 31). Stomach/Bowel: Normal non-distended stomach. Visualized small and large bowel is normal caliber, with no bowel wall thickening. Vascular/Lymphatic: Atherosclerotic nonaneurysmal abdominal aorta. Patent portal, splenic, hepatic and renal veins. No pathologically enlarged lymph nodes in the abdomen. Other: No abdominal ascites or focal fluid collection. Musculoskeletal: Widespread patchy enhancing bone lesions throughout lumbar vertebral bodies and visualized bilateral pelvic girdle, for example a 2.2 cm in the medial right iliac bone (series 28/image 12) and 2.1 cm in left L3 vertebral body (series 28/image 25). IMPRESSION: 1. Hepatomegaly. Liver is nearly completely replaced by innumerable confluent masses with targetoid enhancement, all new since 02/11/2018 CT, compatible with widespread hepatic metastatic disease. 2. Widespread patchy enhancing bone lesions throughout the lumbar vertebral bodies and visualized bilateral pelvic girdle, compatible with widespread osseous metastatic disease. 3. Enhancing 0.7 cm solid left retroperitoneal nodule inferior to lower left renal pole, potentially a small retroperitoneal metastasis. 4. Hypoenhancing solitary splenic metastasis. 5. Partially visualized solid 4.2 cm right lower lobe lung mass and partially visualized subcarinal lymphadenopathy, compatible with presumed primary lung malignancy and metastatic thoracic adenopathy. 6. No biliary ductal dilatation. No cholelithiasis. Electronically Signed   By: JIlona SorrelM.D.   On: 05/18/2022 10:07   MR BRAIN W WO CONTRAST  Result Date: 05/17/2022 CLINICAL DATA:   Non-small  cell lung cancer (NSCLC), staging EXAM: MRI HEAD WITHOUT AND WITH CONTRAST TECHNIQUE: Multiplanar, multiecho pulse sequences of the brain and surrounding structures were obtained without and with intravenous contrast. CONTRAST:  66m GADAVIST GADOBUTROL 1 MMOL/ML IV SOLN COMPARISON:  None Available. FINDINGS: Brain: No acute infarction, hemorrhage, hydrocephalus, extra-axial collection or mass lesion. No pathologic enhancement. Vascular: Major arterial flow voids are maintained at the skull base. Skull and upper cervical spine: Normal marrow signal. Sinuses/Orbits: Clear sinuses.  No acute orbital findings. IMPRESSION: No evidence of acute intracranial abnormality or metastatic disease. Electronically Signed   By: FMargaretha SheffieldM.D.   On: 05/17/2022 14:04   CT ABDOMEN PELVIS W CONTRAST  Result Date: 05/16/2022 CLINICAL DATA:  Abdominal pain EXAM: CT ABDOMEN AND PELVIS WITH CONTRAST TECHNIQUE: Multidetector CT imaging of the abdomen and pelvis was performed using the standard protocol following bolus administration of intravenous contrast. RADIATION DOSE REDUCTION: This exam was performed according to the departmental dose-optimization program which includes automated exposure control, adjustment of the mA and/or kV according to patient size and/or use of iterative reconstruction technique. CONTRAST:  716mOMNIPAQUE IOHEXOL 350 MG/ML SOLN COMPARISON:  CT AP 02/11/18 FINDINGS: Lower chest: Please see separately dictated CTA chest for thoracic findings including a masslike lesion in the right lower lobe. Hepatobiliary: There hepatomegaly with diffusely heterogeneous appearance of the hepatic parenchyma. There is a more focal discrete lesion in the right hepatic lobe measuring up to 3.4 x 4.0 cm. There is an additional discrete lesion in the inferior right hepatic lobe measuring approximately 2.7 x 2.3 cm (series 3, image 30). Findings are worrisome for diffuse metastatic disease. Portal vein is  contrast opacified. Gallbladder is normal in appearance. No evidence of intra or extrahepatic biliary ductal dilatation. Pancreas: No evidence of pancreatic ductal dilatation. No evidence of peripancreatic fat stranding. Spleen: Spleen is normal in size. There is a new hypodense lesion in the posterior aspect of the spleen measuring 1.8 x 2.0 cm (series 3, image 16). Adrenals/Urinary Tract: Bilateral adrenal glands are normal in appearance without evidence of focal lesion. Bilateral kidneys are normal in size and enhance homogeneously. No evidence of hydronephrosis or nephrolithiasis. Stomach/Bowel: The stomach, small bowel, large bowel are normal in caliber without evidence of obstruction. There is diverticulosis with persistent long segment wall thickening sigmoid colon (series 3, image 60). There is no evidence of surrounding inflammatory fat stranding. Vascular/Lymphatic: No significant vascular findings are present. No enlarged abdominal or pelvic lymph nodes. Aortic atherosclerotic calcification. Reproductive: Prostate is unremarkable. Other: No abdominal wall hernia or abnormality. No abdominopelvic ascites. Musculoskeletal: There is a lytic lesion of the superior endplate of L3 (series 7, image 70) worrisome for osseous metastatic disease. IMPRESSION: 1. Hepatomegaly with diffusely heterogeneous appearance of the hepatic parenchyma. There are two discrete lesions in the right hepatic lobe, as above. Findings are worrisome for diffuse metastatic disease. Recommend further evaluation with a contrast enhanced MRI. 2. There is a new hypodense lesion in the posterior aspect of the spleen measuring up to 2.0 cm. This is concerning for metastatic disease. 3. There is a lytic lesion of the superior endplate of L3 worrisome for osseous metastatic disease. Recommend futher evaluation with a contrast enhanaced lumbar spine MRI. 4. There is diverticulosis with persistent long segment wall thickening of the sigmoid  colon. There is no evidence of surrounding inflammatory fat stranding. Findings may represent sequela of chronic diverticulitis. However, given the long segment wall thickening, recommend correlation with colonoscopy to exclude underlying malignancy. Aortic Atherosclerosis (ICD10-I70.0).  Electronically Signed   By: Marin Roberts M.D.   On: 05/16/2022 08:14   CT Angio Chest Pulmonary Embolism (PE) W or WO Contrast  Result Date: 05/16/2022 CLINICAL DATA:  Pulmonary embolism suspected, high probability. Shortness of breath which is increased for 1 week EXAM: CT ANGIOGRAPHY CHEST WITH CONTRAST TECHNIQUE: Multidetector CT imaging of the chest was performed using the standard protocol during bolus administration of intravenous contrast. Multiplanar CT image reconstructions and MIPs were obtained to evaluate the vascular anatomy. RADIATION DOSE REDUCTION: This exam was performed according to the departmental dose-optimization program which includes automated exposure control, adjustment of the mA and/or kV according to patient size and/or use of iterative reconstruction technique. CONTRAST:  37m OMNIPAQUE IOHEXOL 350 MG/ML SOLN COMPARISON:  03/30/2017 FINDINGS: Cardiovascular: Satisfactory opacification of the pulmonary arteries to the segmental level. No evidence of pulmonary embolism. Extrinsic narrowing of medial basal segmental pulmonary arteries on the right due to mass. Normal heart size. No pericardial effusion. Mediastinum/Nodes: Subcarinal, right hilar, and right paratracheal lymphadenopathy. A subcarinal node measures 2.7 cm in diameter. Lungs/Pleura: Lobulated mass in the right lower lobe, peribronchovascular and measuring at least 4 cm in length with probable postobstructive dilatation of associated distal bronchi. Generalized airway thickening. Upper Abdomen: Cirrhotic appearance of the liver since prior with large caudate lobe and diffuse surface lobulation. There are ill-defined lesions in the central  liver measuring 3.5 cm and in the right lower lobe measuring 2.4 cm. Musculoskeletal: No acute or aggressive finding Review of the MIP images confirms the above findings. Prelim sent in epic chat. IMPRESSION: 1. Right lower lobe mass with subcarinal and ipsilateral mediastinal lymphadenopathy, most consistent with metastatic bronchogenic carcinoma. 2. Two ill-defined lesions within the cirrhotic liver which could be primary or metastatic. 3. Negative for pulmonary embolism. Electronically Signed   By: JJorje GuildM.D.   On: 05/16/2022 07:23   DG Chest 2 View  Result Date: 05/16/2022 CLINICAL DATA:  62year old male with history of shortness of breath. EXAM: CHEST - 2 VIEW COMPARISON:  Chest x-ray 03/30/2017. FINDINGS: Lung volumes are normal. No consolidative airspace disease. No pleural effusions. No pneumothorax. No pulmonary nodule or mass noted. Pulmonary vasculature and the cardiomediastinal silhouette are within normal limits. IMPRESSION: No radiographic evidence of acute cardiopulmonary disease. Electronically Signed   By: DVinnie LangtonM.D.   On: 05/16/2022 05:48     ASSESSMENT/PLAN:  This is a very pleasant 62year old Caucasian male diagnosed with extensive stage/stage IV (T2a, N2, M1 C) small cell lung cancer, he presented with a RLL lung mass with subcarinal and ipsilateral mediastinal lymphadenopathy and diffuse hepatic metastatic disease, splenic metastatic disease, and osseous metastatic disease. Diagnosed in February 2024.    The patient is currently undergoing palliative systemic chemotherapy with dose reduced carboplatin for an AUC of 4 on day 1 and dose reduced etoposide to 50 mg/m on days 1, 2, and 3 with Imfinzi 1500 mg on day 1 IV every 3 weeks with Neulasta support.  He underwent his first cycle of treatment last week and tolerated well without any concerning adverse side effects.   He had a staging PET scan last week which showed known metastatic lung cancer with diffuse  hepatic involvement, splenic metastases, and metastatic disease to the bone.   The patient was seen with Dr. MJulien Nordmann The patient repeat labs today which demonstrate worsening anemia with a hemoglobin down to 8.0 today from 12 9 days ago.  He is not very symptomatic from the anemia.  While he had  some mild improvement in his AST and ALT, his bilirubin has increased from 17 to 26 today.  The patient does not have any confusion and is answering questions appropriately.  Coordination normal.  The patient is planning on going to the beach this weekend.  Dr. Julien Nordmann strongly encouraged him to reconsider going to the beach.  He states that he is just going to relax with a friend.  She did develop any new or worsening symptoms he was advised to seek emergency evaluation.  I specifically cautioned the patient should his friend notice any mental status changes, he needs to seek emergency evaluation.   I will add a sample to blood bank weekly, should he develop worsening anemia requiring blood transfusion. We will consider blood transfusion with [facility] in 7's/symptomatic. I have sent a scheduling message for a lab/flush to be scheduled next week. He returns from the beach on Tuesday.   Will see him back for follow-up visit in 2 weeks for evaluation and repeat blood work before undergoing cycle #2.  He is scheduled for Port-A-Cath on 06/09/2022. We reviewed how to use EMLA cream.   The patient was advised to call immediately if he has any concerning symptoms in the interval. The patient voices understanding of current disease status and treatment options and is in agreement with the current care plan. All questions were answered. The patient knows to call the clinic with any problems, questions or concerns. We can certainly see the patient much sooner if necessary      Orders Placed This Encounter  Procedures   Sample to Blood Bank    Standing Status:   Standing    Number of Occurrences:   5    Standing  Expiration Date:   06/09/2023      Tobe Sos Alamin Mccuiston, PA-C 06/09/22  ADDENDUM: Hematology/Oncology Attending: I had a face to face encounter with the patient today.  I reviewed his record, lab, scan and recommended his care plan.  This is a very pleasant 62 years old white male recently diagnosed with extensive stage small cell lung cancer with extensive disease involving the right lower lobe with subcarinal and ipsilateral mediastinal lymphadenopathy as well as diffuse hepatic metastatic disease with splenic metastasis and osseous metastasis diagnosed in February 2024. The patient started last week the first dose of reduced dose systemic chemotherapy with carboplatin for AUC of 4 on day 1 and etoposide 50 Mg/M2 on days 1, 2 and 3 with Imfinzi 1500 Mg IV every 3 weeks.  He tolerated the first week of his treatment fairly well but he continues to have significant jaundice secondary to the metastatic disease to his liver. The patient denied having any current nausea or vomiting.  He denied having any significant chest pain or shortness of breath but continues to have abdominal distention.  He had a PET scan performed recently.  I personally and independently reviewed the PET scan images and discussed the result with the patient and his daughter. I recommended for the patient to continue his current treatment with the systemic chemotherapy and we may adjust the dose higher starting cycle #2 or if he has response to the treatment and improvement of his liver enzymes. The patient will come back for follow-up visit in 2 weeks for evaluation before the next cycle of his treatment. He was advised to call immediately if he has any other concerning symptoms in the interval. The total time spent in the appointment was 30 minutes. Disclaimer: This note was dictated with  voice recognition software. Similar sounding words can inadvertently be transcribed and may be missed upon review. Eilleen Kempf,  MD

## 2022-06-08 ENCOUNTER — Encounter: Payer: Self-pay | Admitting: Internal Medicine

## 2022-06-08 ENCOUNTER — Other Ambulatory Visit: Payer: Self-pay | Admitting: Radiology

## 2022-06-08 NOTE — Progress Notes (Signed)
Called pt to introduce myself as his Arboriculturist and to discuss the [employer].  I went over what it covers but he declined it at this time.  I requested the registration staff give him my card in case he changes his mind and for any questions or concerns he may have in the future.

## 2022-06-09 ENCOUNTER — Inpatient Hospital Stay: Payer: Commercial Managed Care - HMO | Admitting: Physician Assistant

## 2022-06-09 ENCOUNTER — Other Ambulatory Visit: Payer: Self-pay

## 2022-06-09 ENCOUNTER — Ambulatory Visit (HOSPITAL_COMMUNITY)
Admission: RE | Admit: 2022-06-09 | Discharge: 2022-06-09 | Disposition: A | Discharge status: Home / Self Care | Payer: Commercial Managed Care - HMO | Source: Ambulatory Visit | Attending: Physician Assistant | Admitting: Physician Assistant

## 2022-06-09 ENCOUNTER — Inpatient Hospital Stay: Payer: Commercial Managed Care - HMO

## 2022-06-09 ENCOUNTER — Telehealth: Payer: Self-pay | Admitting: Medical Oncology

## 2022-06-09 ENCOUNTER — Encounter (HOSPITAL_COMMUNITY): Payer: Self-pay

## 2022-06-09 ENCOUNTER — Telehealth: Payer: Self-pay | Admitting: Internal Medicine

## 2022-06-09 VITALS — BP 114/69 | HR 78 | Temp 97.6°F | Resp 15 | Wt 186.2 lb

## 2022-06-09 DIAGNOSIS — C3490 Malignant neoplasm of unspecified part of unspecified bronchus or lung: Secondary | ICD-10-CM | POA: Diagnosis present

## 2022-06-09 DIAGNOSIS — I701 Atherosclerosis of renal artery: Secondary | ICD-10-CM | POA: Diagnosis not present

## 2022-06-09 DIAGNOSIS — I10 Essential (primary) hypertension: Secondary | ICD-10-CM | POA: Diagnosis not present

## 2022-06-09 DIAGNOSIS — J449 Chronic obstructive pulmonary disease, unspecified: Secondary | ICD-10-CM | POA: Diagnosis not present

## 2022-06-09 DIAGNOSIS — C7951 Secondary malignant neoplasm of bone: Secondary | ICD-10-CM | POA: Insufficient documentation

## 2022-06-09 DIAGNOSIS — Z87891 Personal history of nicotine dependence: Secondary | ICD-10-CM | POA: Diagnosis not present

## 2022-06-09 DIAGNOSIS — C7889 Secondary malignant neoplasm of other digestive organs: Secondary | ICD-10-CM | POA: Diagnosis not present

## 2022-06-09 DIAGNOSIS — C771 Secondary and unspecified malignant neoplasm of intrathoracic lymph nodes: Secondary | ICD-10-CM | POA: Diagnosis not present

## 2022-06-09 DIAGNOSIS — C787 Secondary malignant neoplasm of liver and intrahepatic bile duct: Secondary | ICD-10-CM | POA: Diagnosis not present

## 2022-06-09 HISTORY — PX: IR IMAGING GUIDED PORT INSERTION: IMG5740

## 2022-06-09 LAB — CBC WITH DIFFERENTIAL (CANCER CENTER ONLY)
Abs Immature Granulocytes: 0.06 10*3/uL (ref 0.00–0.07)
Basophils Absolute: 0 10*3/uL (ref 0.0–0.1)
Basophils Relative: 1 %
Eosinophils Absolute: 0 10*3/uL (ref 0.0–0.5)
Eosinophils Relative: 1 %
HCT: 23.1 % — ABNORMAL LOW (ref 39.0–52.0)
Hemoglobin: 8 g/dL — ABNORMAL LOW (ref 13.0–17.0)
Immature Granulocytes: 2 %
Lymphocytes Relative: 21 %
Lymphs Abs: 0.7 10*3/uL (ref 0.7–4.0)
MCH: 30.4 pg (ref 26.0–34.0)
MCHC: 34.6 g/dL (ref 30.0–36.0)
MCV: 87.8 fL (ref 80.0–100.0)
Monocytes Absolute: 0.2 10*3/uL (ref 0.1–1.0)
Monocytes Relative: 7 %
Neutro Abs: 2.1 10*3/uL (ref 1.7–7.7)
Neutrophils Relative %: 68 %
Platelet Count: 82 10*3/uL — ABNORMAL LOW (ref 150–400)
RBC: 2.63 MIL/uL — ABNORMAL LOW (ref 4.22–5.81)
RDW: 20.6 % — ABNORMAL HIGH (ref 11.5–15.5)
WBC Count: 3.1 10*3/uL — ABNORMAL LOW (ref 4.0–10.5)
nRBC: 1.3 % — ABNORMAL HIGH (ref 0.0–0.2)

## 2022-06-09 LAB — CMP (CANCER CENTER ONLY)
ALT: 207 U/L — ABNORMAL HIGH (ref 0–44)
AST: 83 U/L — ABNORMAL HIGH (ref 15–41)
Albumin: 2.8 g/dL — ABNORMAL LOW (ref 3.5–5.0)
Alkaline Phosphatase: 504 U/L — ABNORMAL HIGH (ref 38–126)
Anion gap: 7 (ref 5–15)
BUN: 16 mg/dL (ref 8–23)
CO2: 32 mmol/L (ref 22–32)
Calcium: 7.4 mg/dL — ABNORMAL LOW (ref 8.9–10.3)
Chloride: 99 mmol/L (ref 98–111)
Creatinine: 0.91 mg/dL (ref 0.61–1.24)
GFR, Estimated: >60 mL/min (ref >60)
Glucose, Bld: 161 mg/dL — ABNORMAL HIGH (ref 70–99)
Potassium: 4.2 mmol/L (ref 3.5–5.1)
Sodium: 138 mmol/L (ref 135–145)
Total Bilirubin: 26 mg/dL (ref 0.3–1.2)
Total Protein: 4.3 g/dL — ABNORMAL LOW (ref 6.5–8.1)

## 2022-06-09 MED ORDER — SODIUM CHLORIDE 0.9 % IV SOLN: INTRAVENOUS | Status: DC

## 2022-06-09 MED ORDER — HEPARIN SOD (PORK) LOCK FLUSH 100 UNIT/ML IV SOLN
INTRAVENOUS | Status: AC
  Administered 2022-06-09: 500 [IU] via INTRAVENOUS
  Filled 2022-06-09: qty 5

## 2022-06-09 MED ORDER — FENTANYL CITRATE (PF) 100 MCG/2ML IJ SOLN
INTRAMUSCULAR | Status: DC | PRN
  Administered 2022-06-09 (×2): 50 ug via INTRAVENOUS

## 2022-06-09 MED ORDER — MIDAZOLAM HCL 2 MG/2ML IJ SOLN
INTRAMUSCULAR | Status: AC
  Filled 2022-06-09: qty 2

## 2022-06-09 MED ORDER — MIDAZOLAM HCL 2 MG/2ML IJ SOLN
INTRAMUSCULAR | Status: DC | PRN
  Administered 2022-06-09 (×2): 1 mg via INTRAVENOUS

## 2022-06-09 MED ORDER — FENTANYL CITRATE (PF) 100 MCG/2ML IJ SOLN
INTRAMUSCULAR | Status: AC
  Filled 2022-06-09: qty 2

## 2022-06-09 MED ORDER — BENZONATATE 100 MG PO CAPS: 100.0000 mg | ORAL_CAPSULE | Freq: Three times a day (TID) | ORAL | 2 refills | Status: DC | PRN

## 2022-06-09 MED ORDER — LIDOCAINE-EPINEPHRINE 1 %-1:100000 IJ SOLN
INTRAMUSCULAR | Status: AC
  Administered 2022-06-09: 20 mL
  Filled 2022-06-09: qty 1

## 2022-06-09 NOTE — Consult Note (Signed)
Chief Complaint: Patient was seen in consultation today for port a cath placement  Referring Physician(s): Heilingoetter,Cassandra L  Supervising Physician: Ruthann Cancer  Patient Status: [facility] - Out-pt  History of Present Illness: AMI Matthew [name] is a 62 y.o. male, ex-smoker, with past medical history of COPD, diverticular disease, hypertension, renal artery stenosis stage IV small cell lung cancer.  He initially presented with a right lower lobe lung mass with subcarinal and ipsilateral mediastinal lymphadenopathy and diffuse hepatic metastatic disease, splenic metastatic disease and osseous metastatic disease in February of this year.  He presents today for Port-A-Cath placement to assist with palliative chemotherapy.  Past Medical History:  Diagnosis Date   Acute bronchitis 03/30/2017   Acute maxillary sinusitis 07/25/2017   Acute otitis media, right 05/05/2016   Acute respiratory failure with hypoxia ([place]) 03/30/2017   CAP (community acquired pneumonia) 03/31/2017   COPD (chronic obstructive pulmonary disease) ([place])    Diarrhea 10/16/2017   Diverticulitis    Ear build-up, left 08/10/2017   Hypertension    Left otitis media 07/17/2017   Pharyngitis 05/05/2016   Renal artery stenosis (HCC)    1 to 59% bilateral by Dopplers 12/2020   Sigmoid diverticulitis 02/11/2018    Past Surgical History:  Procedure Laterality Date   FINE NEEDLE ASPIRATION  05/17/2022   Procedure: FINE NEEDLE ASPIRATION (FNA) LINEAR;  Surgeon: Garner Nash, DO;  Location: [place];  Service: Cardiopulmonary;;   [place] N/A 05/17/2022   Procedure: VIDEO BRONCHOSCOPY WITH ENDOBRONCHIAL ULTRASOUND;  Surgeon: Garner Nash, DO;  Location: [place];  Service: Cardiopulmonary;  Laterality: N/A;    Allergies: Patient has no known allergies.  Medications: Prior to Admission medications   Medication Sig  Start Date End Date Taking? Authorizing Provider  amLODipine (NORVASC) 10 MG tablet TAKE 1 TABLET BY MOUTH DAILY Patient taking differently: Take 10 mg by mouth at bedtime. 04/12/22  Yes Sueanne Margarita, MD  aspirin EC 81 MG tablet Take 1 tablet (81 mg total) by mouth daily. Swallow whole. 05/31/21  Yes Belva Crome, MD  eszopiclone (LUNESTA) 2 MG TABS tablet Take 2 mg by mouth at bedtime as needed for sleep. Take immediately before bedtime   Yes [provider]  famotidine-calcium carbonate-magnesium hydroxide (PEPCID COMPLETE) 10-800-165 MG chewable tablet Chew 1 tablet by mouth daily as needed (indigestion, heartburn).   Yes [provider]  furosemide (LASIX) 20 MG tablet Take 1 tablet (20 mg total) by mouth daily. 05/18/22 08/16/22 Yes [place] Eric J, DO  ibuprofen (ADVIL) 200 MG tablet Take 600 mg by mouth daily as needed for headache, mild pain or moderate pain.   Yes [provider]  LORazepam (ATIVAN) 0.5 MG tablet Take 1 tablet (0.5 mg total) by mouth every 6 (six) hours as needed for anxiety. 05/18/22  Yes [place] Eric J, DO  metoprolol succinate (TOPROL-XL) 100 MG 24 hr tablet Take 1 tablet (100 mg total) by mouth daily. Patient taking differently: Take 100 mg by mouth at bedtime. 04/06/22  Yes Turner, Eber Hong, MD  omeprazole (PRILOSEC OTC) 20 MG tablet Take 20 mg by mouth daily as needed (indigestion, heartburn).   Yes [provider]  prochlorperazine (COMPAZINE) 10 MG tablet Take 1 tablet (10 mg total) by mouth every 6 (six) hours as needed. 05/31/22  Yes Heilingoetter, Cassandra L, PA-C  benzonatate (TESSALON) 100 MG capsule Take 1 capsule (100 mg total) by  mouth 3 (three) times daily as needed for cough. 06/09/22   Heilingoetter, Cassandra L, PA-C  lidocaine-prilocaine (EMLA) cream Apply 1 Application topically as needed. 05/31/22   Heilingoetter, Cassandra L, PA-C  nitroGLYCERIN (NITROSTAT) 0.4 MG SL tablet Place 1 tablet (0.4 mg total) under the tongue  every 5 (five) minutes as needed for chest pain. 05/31/21 07/02/22  Belva Crome, MD  traZODone (DESYREL) 50 MG tablet Take 1 tablet (50 mg total) by mouth at bedtime. 05/18/22 08/16/22  [place] Eric J, DO     Family History  Problem Relation Age of Onset   Lymphoma Father    Cancer Mother        breast   Colon cancer Neg Hx    Rectal cancer Neg Hx    Stomach cancer Neg Hx     Social History   Socioeconomic History   Marital status: Widowed    Spouse name: Not on file   Number of children: 1   Years of education: Not on file   Highest education level: Not on file  Occupational History   Not on file  Tobacco Use   Smoking status: Former    Packs/day: 1    Types: Cigarettes    Quit date: 03/30/2017    Years since quitting: 5.1   Smokeless tobacco: Never  Vaping Use   Vaping Use: Never used  Substance and Sexual Activity   Alcohol use: Not on file    Comment: 7 cans of beer a week.   Drug use: Not Currently    Frequency: 2.0 times per week    Types: Marijuana   Sexual activity: Yes    Birth control/protection: None  Other Topics Concern   Not on file  Social History Narrative   Not on file   Social Determinants of Health   Financial Resource Strain: Not on file  Food Insecurity: No Food Insecurity (05/16/2022)   Hunger Vital Sign    Worried About Running Out of Food in the Last Year: Never true    Ran Out of Food in the Last Year: Never true  Transportation Needs: No Transportation Needs (05/16/2022)   PRAPARE - Hydrologist (Medical): No    Lack of Transportation (Non-Medical): No  Physical Activity: Not on file  Stress: Not on file  Social Connections: Not on file      Review of Systems denies fever,HA, CP, N/V or bleeding ; he does have some dyspnea with exertion, occ cough, abdominal bloating/pressure, occ back pain  Vital Signs: BP 132/69   Pulse 79   Temp 98.2 F (36.8 C) (Oral)   Resp 18   Ht '5\' 5"'$  (1.651 m)   Wt 186 lb  3.2 oz (84.5 kg)   SpO2 95%   BMI 30.99 kg/m   Code Status: FULL CODE  Physical Exam awake/alert; jaundiced with scleral icterus; chest- distant but clear BS bilat; heart- RRR; abd- dist, +BS, some mild diffuse tenderness to palpation; bilat pretibial edema  Imaging: [place] ASCITES (ABDOMEN LIMITED)  Result Date: 06/03/2022 CLINICAL DATA:  Abdominal distention. He feels that he may have ascites, but we are unclear. Please do ultrasound, and if no significant fluid, then don't proceed EXAM: LIMITED ABDOMEN ULTRASOUND FOR ASCITES TECHNIQUE: Limited ultrasound survey for ascites was performed in all four abdominal quadrants. COMPARISON:  CT AP, 05/16/2022. FINDINGS: Focused ultrasound along the abdominal quadrants. Trace peritoneal fluid at the RIGHT lower quadrant. No significant volume for safe paracentesis. Procedure was not  performed. Performed by Rushie Nyhan, IR NP IMPRESSION: Trace peritoneal fluid of the RIGHT lower quadrant. No significant volume for paracentesis. Procedure was not performed. Michaelle Birks, MD [facility] Radiology Electronically Signed   By: Michaelle Birks M.D.   On: 06/03/2022 18:48   NM PET Image Initial (PI) Skull Base To Thigh (F-18 FDG)  Result Date: 06/02/2022 CLINICAL DATA:  Initial treatment strategy for non-small cell lung cancer. Widespread metastatic disease on imaging. EXAM: NUCLEAR MEDICINE PET SKULL BASE TO THIGH TECHNIQUE: 11.1 mCi F-18 FDG was injected intravenously. Full-ring PET imaging was performed from the skull base to thigh after the radiotracer. CT data was obtained and used for attenuation correction and anatomic localization. Fasting blood glucose: 146 mg/dl COMPARISON:  Chest CTA and abdominopelvic CT 05/16/2022. Abdominal MRI 05/18/2022. FINDINGS: Mediastinal blood pool activity: SUV max 1.8 NECK: No hypermetabolic cervical lymph nodes are identified.Fairly symmetric activity within the lymphoid tissue of  Waldeyer's ring is within physiologic limits.No suspicious activity identified within the pharyngeal mucosal space. Incidental CT findings: Bilateral carotid atherosclerosis. CHEST: There are hypermetabolic and enlarged right paratracheal, subcarinal and right hilar lymph nodes. Subcarinal node has a short axis dimension of 2.0 cm and SUV max of 5.8. Irregularly lobulated right lower lobe mass measuring 3.7 x 3.0 cm on image [id number] is hypermetabolic with an SUV max of 4.7. No other hypermetabolic pulmonary activity or suspicious nodularity. Incidental CT findings: Mild linear atelectasis or scarring at the left lung base. Mild atherosclerosis of the aorta, great vessels and coronary arteries. ABDOMEN/PELVIS: The liver is enlarged, measuring 30.3 cm transverse. There are underlying cirrhotic changes with innumerable hypermetabolic lesions consistent with widespread metastatic disease. Confluent lesions in the left lobe evidence UV max of 7.1. The medial splenic lesion seen on MRI is hypermetabolic (SUV max 5.3). No evidence of metastatic disease within the adrenal glands or pancreas. There is no hypermetabolic nodal activity in the abdomen or pelvis. Bowel activity within physiologic limits. Small amount of ascites without associated hypermetabolic activity to suggest carcinomatosis. Incidental CT findings: Contracted gallbladder with small gallstones. Diffuse aortic and branch vessel atherosclerosis. As above, ascites without definite evidence of carcinomatosis. There are diverticular changes in the distal colon. SKELETON: Widespread heterogeneous metabolic activity throughout the bones consistent with metastatic disease. There are multiple lesions throughout the spine, bony pelvis, sternum, ribs and both proximal humeri. No evidence of pathologic fracture. Incidental CT findings: none IMPRESSION: 1. Hypermetabolic right lower lobe pulmonary mass consistent with primary bronchogenic carcinoma. 2. Hypermetabolic nodal  metastases in the right hilum and mediastinum. 3. Widespread hypermetabolic hepatic metastatic disease with underlying cirrhosis. 4. Hypermetabolic splenic lesion consistent with a metastasis. 5. Widespread hypermetabolic osseous metastatic disease. 6. Ascites without definite signs of peritoneal carcinomatosis. 7.  Aortic Atherosclerosis (ICD10-I70.0). Electronically Signed   By: Richardean Sale M.D.   On: 06/02/2022 15:18   MR ABDOMEN MRCP W WO CONTAST  Result Date: 05/18/2022 CLINICAL DATA:  Inpatient. Right lower lobe lung mass with mediastinal adenopathy suspicious for primary lung malignancy on recent chest CT angiogram. Evaluate abnormal liver and spleen suspicious for metastatic disease on recent CT abdomen/pelvis. Jaundice. EXAM: MRI ABDOMEN WITHOUT AND WITH CONTRAST (INCLUDING MRCP) TECHNIQUE: Multiplanar multisequence MR imaging of the abdomen was performed both before and after the administration of intravenous contrast. Heavily T2-weighted images of the biliary and pancreatic ducts were obtained, and three-dimensional MRCP images were rendered by post processing. CONTRAST:  15m GADAVIST GADOBUTROL 1 MMOL/ML IV SOLN COMPARISON:  05/16/2022 chest CT  angiogram and CT abdomen/pelvis. FINDINGS: Lower chest: Partially visualized irregular solid 4.2 cm right lower lobe lung mass (series 4/image 1). Partially visualized subcarinal lymphadenopathy on series 5/image 16). Hepatobiliary: Hepatomegaly. Liver is nearly completely replaced by innumerable (> than 50) confluent masses with targetoid enhancement, all new since 02/11/2018 CT. Representative 6.3 x 5.0 cm segment 3 left liver mass (series 4/image 20). Representative 4.2 x 3.8 cm segment 4A left liver mass (series 4/image 10). Representative 3.5 x 3.0 cm segment 5 right liver mass (series 4/image 21). Contracted gallbladder with no cholelithiasis. No biliary ductal dilatation. Common bile duct diameter 2 mm. No choledocholithiasis. No biliary masses,  strictures or beading. Pancreas: No pancreatic mass or duct dilation.  No pancreas divisum. Spleen: Normal size spleen. Posterior splenic 2.2 x 2.2 cm mass (series 4/image 13) with heterogeneous hypoenhancement, new from 02/11/2018 CT. Adrenals/Urinary Tract: Normal adrenals. No hydronephrosis. Normal kidneys with no renal mass. Enhancing 0.7 cm solid left retroperitoneal nodule inferior to lower left renal pole (series 8/image 31). Stomach/Bowel: Normal non-distended stomach. Visualized small and large bowel is normal caliber, with no bowel wall thickening. Vascular/Lymphatic: Atherosclerotic nonaneurysmal abdominal aorta. Patent portal, splenic, hepatic and renal veins. No pathologically enlarged lymph nodes in the abdomen. Other: No abdominal ascites or focal fluid collection. Musculoskeletal: Widespread patchy enhancing bone lesions throughout lumbar vertebral bodies and visualized bilateral pelvic girdle, for example a 2.2 cm in the medial right iliac bone (series 28/image 12) and 2.1 cm in left L3 vertebral body (series 28/image 25). IMPRESSION: 1. Hepatomegaly. Liver is nearly completely replaced by innumerable confluent masses with targetoid enhancement, all new since 02/11/2018 CT, compatible with widespread hepatic metastatic disease. 2. Widespread patchy enhancing bone lesions throughout the lumbar vertebral bodies and visualized bilateral pelvic girdle, compatible with widespread osseous metastatic disease. 3. Enhancing 0.7 cm solid left retroperitoneal nodule inferior to lower left renal pole, potentially a small retroperitoneal metastasis. 4. Hypoenhancing solitary splenic metastasis. 5. Partially visualized solid 4.2 cm right lower lobe lung mass and partially visualized subcarinal lymphadenopathy, compatible with presumed primary lung malignancy and metastatic thoracic adenopathy. 6. No biliary ductal dilatation. No cholelithiasis. Electronically Signed   By: Ilona Sorrel M.D.   On: 05/18/2022 10:07    MR 3D Recon At Scanner  Result Date: 05/18/2022 CLINICAL DATA:  Inpatient. Right lower lobe lung mass with mediastinal adenopathy suspicious for primary lung malignancy on recent chest CT angiogram. Evaluate abnormal liver and spleen suspicious for metastatic disease on recent CT abdomen/pelvis. Jaundice. EXAM: MRI ABDOMEN WITHOUT AND WITH CONTRAST (INCLUDING MRCP) TECHNIQUE: Multiplanar multisequence MR imaging of the abdomen was performed both before and after the administration of intravenous contrast. Heavily T2-weighted images of the biliary and pancreatic ducts were obtained, and three-dimensional MRCP images were rendered by post processing. CONTRAST:  76m GADAVIST GADOBUTROL 1 MMOL/ML IV SOLN COMPARISON:  05/16/2022 chest CT angiogram and CT abdomen/pelvis. FINDINGS: Lower chest: Partially visualized irregular solid 4.2 cm right lower lobe lung mass (series 4/image 1). Partially visualized subcarinal lymphadenopathy on series 5/image 16). Hepatobiliary: Hepatomegaly. Liver is nearly completely replaced by innumerable (> than 50) confluent masses with targetoid enhancement, all new since 02/11/2018 CT. Representative 6.3 x 5.0 cm segment 3 left liver mass (series 4/image 20). Representative 4.2 x 3.8 cm segment 4A left liver mass (series 4/image 10). Representative 3.5 x 3.0 cm segment 5 right liver mass (series 4/image 21). Contracted gallbladder with no cholelithiasis. No biliary ductal dilatation. Common bile duct diameter 2 mm. No choledocholithiasis. No biliary masses, strictures or  beading. Pancreas: No pancreatic mass or duct dilation.  No pancreas divisum. Spleen: Normal size spleen. Posterior splenic 2.2 x 2.2 cm mass (series 4/image 13) with heterogeneous hypoenhancement, new from 02/11/2018 CT. Adrenals/Urinary Tract: Normal adrenals. No hydronephrosis. Normal kidneys with no renal mass. Enhancing 0.7 cm solid left retroperitoneal nodule inferior to lower left renal pole (series 8/image 31).  Stomach/Bowel: Normal non-distended stomach. Visualized small and large bowel is normal caliber, with no bowel wall thickening. Vascular/Lymphatic: Atherosclerotic nonaneurysmal abdominal aorta. Patent portal, splenic, hepatic and renal veins. No pathologically enlarged lymph nodes in the abdomen. Other: No abdominal ascites or focal fluid collection. Musculoskeletal: Widespread patchy enhancing bone lesions throughout lumbar vertebral bodies and visualized bilateral pelvic girdle, for example a 2.2 cm in the medial right iliac bone (series 28/image 12) and 2.1 cm in left L3 vertebral body (series 28/image 25). IMPRESSION: 1. Hepatomegaly. Liver is nearly completely replaced by innumerable confluent masses with targetoid enhancement, all new since 02/11/2018 CT, compatible with widespread hepatic metastatic disease. 2. Widespread patchy enhancing bone lesions throughout the lumbar vertebral bodies and visualized bilateral pelvic girdle, compatible with widespread osseous metastatic disease. 3. Enhancing 0.7 cm solid left retroperitoneal nodule inferior to lower left renal pole, potentially a small retroperitoneal metastasis. 4. Hypoenhancing solitary splenic metastasis. 5. Partially visualized solid 4.2 cm right lower lobe lung mass and partially visualized subcarinal lymphadenopathy, compatible with presumed primary lung malignancy and metastatic thoracic adenopathy. 6. No biliary ductal dilatation. No cholelithiasis. Electronically Signed   By: Ilona Sorrel M.D.   On: 05/18/2022 10:07   MR BRAIN W WO CONTRAST  Result Date: 05/17/2022 CLINICAL DATA:  Non-small cell lung cancer (NSCLC), staging EXAM: MRI HEAD WITHOUT AND WITH CONTRAST TECHNIQUE: Multiplanar, multiecho pulse sequences of the brain and surrounding structures were obtained without and with intravenous contrast. CONTRAST:  83m GADAVIST GADOBUTROL 1 MMOL/ML IV SOLN COMPARISON:  None Available. FINDINGS: Brain: No acute infarction, hemorrhage,  hydrocephalus, extra-axial collection or mass lesion. No pathologic enhancement. Vascular: Major arterial flow voids are maintained at the skull base. Skull and upper cervical spine: Normal marrow signal. Sinuses/Orbits: Clear sinuses.  No acute orbital findings. IMPRESSION: No evidence of acute intracranial abnormality or metastatic disease. Electronically Signed   By: FMargaretha SheffieldM.D.   On: 05/17/2022 14:04   CT ABDOMEN PELVIS W CONTRAST  Result Date: 05/16/2022 CLINICAL DATA:  Abdominal pain EXAM: CT ABDOMEN AND PELVIS WITH CONTRAST TECHNIQUE: Multidetector CT imaging of the abdomen and pelvis was performed using the standard protocol following bolus administration of intravenous contrast. RADIATION DOSE REDUCTION: This exam was performed according to the departmental dose-optimization program which includes automated exposure control, adjustment of the mA and/or kV according to patient size and/or use of iterative reconstruction technique. CONTRAST:  730mOMNIPAQUE IOHEXOL 350 MG/ML SOLN COMPARISON:  CT AP 02/11/18 FINDINGS: Lower chest: Please see separately dictated CTA chest for thoracic findings including a masslike lesion in the right lower lobe. Hepatobiliary: There hepatomegaly with diffusely heterogeneous appearance of the hepatic parenchyma. There is a more focal discrete lesion in the right hepatic lobe measuring up to 3.4 x 4.0 cm. There is an additional discrete lesion in the inferior right hepatic lobe measuring approximately 2.7 x 2.3 cm (series 3, image 30). Findings are worrisome for diffuse metastatic disease. Portal vein is contrast opacified. Gallbladder is normal in appearance. No evidence of intra or extrahepatic biliary ductal dilatation. Pancreas: No evidence of pancreatic ductal dilatation. No evidence of peripancreatic fat stranding. Spleen: Spleen is normal in size.  There is a new hypodense lesion in the posterior aspect of the spleen measuring 1.8 x 2.0 cm (series 3, image  16). Adrenals/Urinary Tract: Bilateral adrenal glands are normal in appearance without evidence of focal lesion. Bilateral kidneys are normal in size and enhance homogeneously. No evidence of hydronephrosis or nephrolithiasis. Stomach/Bowel: The stomach, small bowel, large bowel are normal in caliber without evidence of obstruction. There is diverticulosis with persistent long segment wall thickening sigmoid colon (series 3, image 60). There is no evidence of surrounding inflammatory fat stranding. Vascular/Lymphatic: No significant vascular findings are present. No enlarged abdominal or pelvic lymph nodes. Aortic atherosclerotic calcification. Reproductive: Prostate is unremarkable. Other: No abdominal wall hernia or abnormality. No abdominopelvic ascites. Musculoskeletal: There is a lytic lesion of the superior endplate of L3 (series 7, image 70) worrisome for osseous metastatic disease. IMPRESSION: 1. Hepatomegaly with diffusely heterogeneous appearance of the hepatic parenchyma. There are two discrete lesions in the right hepatic lobe, as above. Findings are worrisome for diffuse metastatic disease. Recommend further evaluation with a contrast enhanced MRI. 2. There is a new hypodense lesion in the posterior aspect of the spleen measuring up to 2.0 cm. This is concerning for metastatic disease. 3. There is a lytic lesion of the superior endplate of L3 worrisome for osseous metastatic disease. Recommend futher evaluation with a contrast enhanaced lumbar spine MRI. 4. There is diverticulosis with persistent long segment wall thickening of the sigmoid colon. There is no evidence of surrounding inflammatory fat stranding. Findings may represent sequela of chronic diverticulitis. However, given the long segment wall thickening, recommend correlation with colonoscopy to exclude underlying malignancy. Aortic Atherosclerosis (ICD10-I70.0). Electronically Signed   By: Marin Roberts M.D.   On: 05/16/2022 08:14   CT  Angio Chest Pulmonary Embolism (PE) W or WO Contrast  Result Date: 05/16/2022 CLINICAL DATA:  Pulmonary embolism suspected, high probability. Shortness of breath which is increased for 1 week EXAM: CT ANGIOGRAPHY CHEST WITH CONTRAST TECHNIQUE: Multidetector CT imaging of the chest was performed using the standard protocol during bolus administration of intravenous contrast. Multiplanar CT image reconstructions and MIPs were obtained to evaluate the vascular anatomy. RADIATION DOSE REDUCTION: This exam was performed according to the departmental dose-optimization program which includes automated exposure control, adjustment of the mA and/or kV according to patient size and/or use of iterative reconstruction technique. CONTRAST:  33m OMNIPAQUE IOHEXOL 350 MG/ML SOLN COMPARISON:  03/30/2017 FINDINGS: Cardiovascular: Satisfactory opacification of the pulmonary arteries to the segmental level. No evidence of pulmonary embolism. Extrinsic narrowing of medial basal segmental pulmonary arteries on the right due to mass. Normal heart size. No pericardial effusion. Mediastinum/Nodes: Subcarinal, right hilar, and right paratracheal lymphadenopathy. A subcarinal node measures 2.7 cm in diameter. Lungs/Pleura: Lobulated mass in the right lower lobe, peribronchovascular and measuring at least 4 cm in length with probable postobstructive dilatation of associated distal bronchi. Generalized airway thickening. Upper Abdomen: Cirrhotic appearance of the liver since prior with large caudate lobe and diffuse surface lobulation. There are ill-defined lesions in the central liver measuring 3.5 cm and in the right lower lobe measuring 2.4 cm. Musculoskeletal: No acute or aggressive finding Review of the MIP images confirms the above findings. Prelim sent in epic chat. IMPRESSION: 1. Right lower lobe mass with subcarinal and ipsilateral mediastinal lymphadenopathy, most consistent with metastatic bronchogenic carcinoma. 2. Two  ill-defined lesions within the cirrhotic liver which could be primary or metastatic. 3. Negative for pulmonary embolism. Electronically Signed   By: JJorje GuildM.D.   On:  05/16/2022 07:23   DG Chest 2 View  Result Date: 05/16/2022 CLINICAL DATA:  62 year old male with history of shortness of breath. EXAM: CHEST - 2 VIEW COMPARISON:  Chest x-ray 03/30/2017. FINDINGS: Lung volumes are normal. No consolidative airspace disease. No pleural effusions. No pneumothorax. No pulmonary nodule or mass noted. Pulmonary vasculature and the cardiomediastinal silhouette are within normal limits. IMPRESSION: No radiographic evidence of acute cardiopulmonary disease. Electronically Signed   By: Vinnie Langton M.D.   On: 05/16/2022 05:48    Labs:  CBC: Recent Labs    05/16/22 0517 05/17/22 0042 05/31/22 0854 06/09/22 0718  WBC 15.6* 14.5* 16.8* 3.1*  HGB 13.5 14.1 12.2* 8.0*  HCT 39.1 40.0 33.8* 23.1*  PLT 280 270 226 82*    COAGS: Recent Labs    05/16/22 0517  INR 1.0    BMP: Recent Labs    05/17/22 0042 05/18/22 0258 05/31/22 0854 06/09/22 0718  NA 130* 134* 137 138  K 3.4* 3.4* 3.4* 4.2  CL 88* 89* 95* 99  CO2 30 36* 37* 32  GLUCOSE 134* 150* 160* 161*  BUN '14 21 22 16  '$ CALCIUM 8.0* 8.2* 7.5* 7.4*  CREATININE 0.95 1.07 1.13 0.91  GFRNONAA >60 >60 >60 >60    LIVER FUNCTION TESTS: Recent Labs    05/17/22 0042 05/18/22 0258 05/31/22 0854 06/09/22 0718  BILITOT 5.5* 5.9* 17.1* 26.0*  AST 136* 159* 115* 83*  ALT 313* 327* 292* 207*  ALKPHOS 433* 412* 393* 504*  PROT 6.0* 5.7* 4.8* 4.3*  ALBUMIN 3.3* 2.9* 3.1* 2.8*    TUMOR MARKERS: No results for input(s): "AFPTM", "CEA", "CA199", "CHROMGRNA" in the last 8760 hours.  Assessment and Plan: 62 y.o. male, ex-smoker, with past medical history of COPD, diverticular disease, hypertension, renal artery stenosis stage IV small cell lung cancer.  He initially presented with a right lower lobe lung mass with subcarinal and  ipsilateral mediastinal lymphadenopathy and diffuse hepatic metastatic disease, splenic metastatic disease and osseous metastatic disease in February of this year.  He presents today for Port-A-Cath placement to assist with palliative chemotherapy.Risks and benefits of image guided port-a-catheter placement was discussed with the patient/mother including, but not limited to bleeding, infection, pneumothorax, or fibrin sheath development and need for additional procedures.  All of the patient's questions were answered, patient is agreeable to proceed. Consent signed and in chart.    Thank you for this interesting consult.  I greatly enjoyed meeting TAMAJ DAMBROSI and look forward to participating in their care.  A copy of this report was sent to the requesting provider on this date.  Electronically Signed: D. Rowe Robert, PA-C 06/09/2022, 12:59 PM   I spent a total of  25 minutes   in face to face in clinical consultation, greater than 50% of which was counseling/coordinating care for port a cath placement

## 2022-06-09 NOTE — Procedures (Signed)
Interventional Radiology Procedure Note ° °Procedure: Single Lumen Power Port Placement   ° °Access:  Right internal jugular vein ° °Findings: Catheter tip positioned at cavoatrial junction. Port is ready for immediate use.  ° °Complications: None ° °EBL: < 10 mL ° °Recommendations:  °- Ok to shower in 24 hours °- Do not submerge for 7 days °- Routine line care  ° ° °Lenola Lockner, MD ° ° ° °

## 2022-06-09 NOTE — Telephone Encounter (Signed)
Per 3/14 IB reached out to patient to schedule port flush next Thursday, left voicemail.

## 2022-06-09 NOTE — Telephone Encounter (Signed)
CRITICAL VALUE STICKER  CRITICAL VALUE: T bilirubin =26  RECEIVER (on-site recipient of call): Abelina Bachelor,  RN [place] NOTIFIED: 06/09/2022 @ [phone]  MESSENGER (representative from lab): Tammi Sou , RN  MD NOTIFIED:  Heilingoetter, PA-C  TIME OF NOTIFICATIONKR:[id number]  RESPONSE:  She will f/u with Julien Nordmann, MD

## 2022-06-09 NOTE — Discharge Instructions (Addendum)

## 2022-06-14 ENCOUNTER — Encounter (HOSPITAL_COMMUNITY): Payer: Self-pay

## 2022-06-15 ENCOUNTER — Other Ambulatory Visit: Payer: Self-pay | Admitting: Physician Assistant

## 2022-06-15 DIAGNOSIS — C3490 Malignant neoplasm of unspecified part of unspecified bronchus or lung: Secondary | ICD-10-CM

## 2022-06-16 ENCOUNTER — Other Ambulatory Visit: Payer: Self-pay | Admitting: Physician Assistant

## 2022-06-16 ENCOUNTER — Other Ambulatory Visit: Payer: Self-pay | Admitting: Medical Oncology

## 2022-06-16 ENCOUNTER — Inpatient Hospital Stay: Payer: Commercial Managed Care - HMO | Admitting: Physician Assistant

## 2022-06-16 ENCOUNTER — Other Ambulatory Visit: Payer: Self-pay | Admitting: Internal Medicine

## 2022-06-16 ENCOUNTER — Other Ambulatory Visit: Payer: Self-pay

## 2022-06-16 ENCOUNTER — Inpatient Hospital Stay: Payer: Commercial Managed Care - HMO

## 2022-06-16 ENCOUNTER — Telehealth: Payer: Self-pay

## 2022-06-16 VITALS — BP 134/64 | HR 81 | Temp 98.2°F | Resp 17

## 2022-06-16 DIAGNOSIS — E876 Hypokalemia: Secondary | ICD-10-CM

## 2022-06-16 DIAGNOSIS — D649 Anemia, unspecified: Secondary | ICD-10-CM

## 2022-06-16 DIAGNOSIS — D6481 Anemia due to antineoplastic chemotherapy: Secondary | ICD-10-CM

## 2022-06-16 DIAGNOSIS — C3431 Malignant neoplasm of lower lobe, right bronchus or lung: Secondary | ICD-10-CM | POA: Diagnosis not present

## 2022-06-16 DIAGNOSIS — C3490 Malignant neoplasm of unspecified part of unspecified bronchus or lung: Secondary | ICD-10-CM

## 2022-06-16 DIAGNOSIS — R197 Diarrhea, unspecified: Secondary | ICD-10-CM

## 2022-06-16 DIAGNOSIS — Z95828 Presence of other vascular implants and grafts: Secondary | ICD-10-CM | POA: Insufficient documentation

## 2022-06-16 LAB — CBC WITH DIFFERENTIAL (CANCER CENTER ONLY)
Abs Immature Granulocytes: 6.02 10*3/uL — ABNORMAL HIGH (ref 0.00–0.07)
Basophils Absolute: 0.1 10*3/uL (ref 0.0–0.1)
Basophils Relative: 0 %
Eosinophils Absolute: 0 10*3/uL (ref 0.0–0.5)
Eosinophils Relative: 0 %
HCT: 19.6 % — ABNORMAL LOW (ref 39.0–52.0)
Hemoglobin: 6.5 g/dL — CL (ref 13.0–17.0)
Immature Granulocytes: 22 %
Lymphocytes Relative: 5 %
Lymphs Abs: 1.4 10*3/uL (ref 0.7–4.0)
MCH: 30.8 pg (ref 26.0–34.0)
MCHC: 33.2 g/dL (ref 30.0–36.0)
MCV: 92.9 fL (ref 80.0–100.0)
Monocytes Absolute: 3.2 10*3/uL — ABNORMAL HIGH (ref 0.1–1.0)
Monocytes Relative: 11 %
Neutro Abs: 17.3 10*3/uL — ABNORMAL HIGH (ref 1.7–7.7)
Neutrophils Relative %: 62 %
Platelet Count: 102 10*3/uL — ABNORMAL LOW (ref 150–400)
RBC: 2.11 MIL/uL — ABNORMAL LOW (ref 4.22–5.81)
RDW: 21.8 % — ABNORMAL HIGH (ref 11.5–15.5)
WBC Count: 28 10*3/uL — ABNORMAL HIGH (ref 4.0–10.5)
nRBC: 8.4 % — ABNORMAL HIGH (ref 0.0–0.2)

## 2022-06-16 LAB — SAMPLE TO BLOOD BANK

## 2022-06-16 LAB — CMP (CANCER CENTER ONLY)
ALT: 175 U/L — ABNORMAL HIGH (ref 0–44)
AST: 106 U/L — ABNORMAL HIGH (ref 15–41)
Albumin: 2.7 g/dL — ABNORMAL LOW (ref 3.5–5.0)
Alkaline Phosphatase: 838 U/L — ABNORMAL HIGH (ref 38–126)
Anion gap: 7 (ref 5–15)
BUN: 16 mg/dL (ref 8–23)
CO2: 35 mmol/L — ABNORMAL HIGH (ref 22–32)
Calcium: 7 mg/dL — ABNORMAL LOW (ref 8.9–10.3)
Chloride: 93 mmol/L — ABNORMAL LOW (ref 98–111)
Creatinine: 0.84 mg/dL (ref 0.61–1.24)
GFR, Estimated: >60 mL/min (ref >60)
Glucose, Bld: 120 mg/dL — ABNORMAL HIGH (ref 70–99)
Potassium: 2.7 mmol/L — CL (ref 3.5–5.1)
Sodium: 135 mmol/L (ref 135–145)
Total Bilirubin: 26.2 mg/dL (ref 0.3–1.2)
Total Protein: 4.1 g/dL — ABNORMAL LOW (ref 6.5–8.1)

## 2022-06-16 LAB — PREPARE RBC (CROSSMATCH)

## 2022-06-16 LAB — ABO/RH: ABO/RH(D): A NEG

## 2022-06-16 MED ORDER — SODIUM CHLORIDE 0.9 % IV SOLN: Freq: Once | INTRAVENOUS | Status: AC

## 2022-06-16 MED ORDER — HEPARIN SOD (PORK) LOCK FLUSH 100 UNIT/ML IV SOLN: 500.0000 [IU] | Freq: Once | INTRAVENOUS | Status: DC

## 2022-06-16 MED ORDER — SODIUM CHLORIDE 0.9% FLUSH
10.0000 mL | INTRAVENOUS | Status: AC | PRN
  Administered 2022-06-16: 10 mL

## 2022-06-16 MED ORDER — HEPARIN SOD (PORK) LOCK FLUSH 100 UNIT/ML IV SOLN
500.0000 [IU] | Freq: Every day | INTRAVENOUS | Status: AC | PRN
  Administered 2022-06-16: 500 [IU]

## 2022-06-16 MED ORDER — POTASSIUM CHLORIDE CRYS ER 20 MEQ PO TBCR: 20.0000 meq | EXTENDED_RELEASE_TABLET | Freq: Two times a day (BID) | ORAL | 0 refills | Status: DC

## 2022-06-16 MED ORDER — DIPHENHYDRAMINE HCL 25 MG PO CAPS
25.0000 mg | ORAL_CAPSULE | Freq: Once | ORAL | Status: AC
  Administered 2022-06-16: 25 mg via ORAL
  Filled 2022-06-16: qty 1

## 2022-06-16 MED ORDER — SODIUM CHLORIDE 0.9% IV SOLUTION
250.0000 mL | Freq: Once | INTRAVENOUS | Status: AC
  Administered 2022-06-16: 250 mL via INTRAVENOUS

## 2022-06-16 MED ORDER — DIPHENOXYLATE-ATROPINE 2.5-0.025 MG PO TABS: 1.0000 | ORAL_TABLET | Freq: Four times a day (QID) | ORAL | 0 refills | Status: DC | PRN

## 2022-06-16 MED ORDER — SODIUM CHLORIDE 0.9% FLUSH
10.0000 mL | Freq: Once | INTRAVENOUS | Status: AC
  Administered 2022-06-16: 10 mL

## 2022-06-16 MED ORDER — DIPHENOXYLATE-ATROPINE 2.5-0.025 MG PO TABS
1.0000 | ORAL_TABLET | Freq: Once | ORAL | Status: AC
  Administered 2022-06-16: 1 via ORAL
  Filled 2022-06-16: qty 1

## 2022-06-16 MED ORDER — POTASSIUM CHLORIDE 10 MEQ/100ML IV SOLN
10.0000 meq | INTRAVENOUS | Status: AC
  Administered 2022-06-16 (×2): 10 meq via INTRAVENOUS
  Filled 2022-06-16: qty 100

## 2022-06-16 MED ORDER — ACETAMINOPHEN 325 MG PO TABS
650.0000 mg | ORAL_TABLET | Freq: Once | ORAL | Status: AC
  Administered 2022-06-16: 650 mg via ORAL
  Filled 2022-06-16: qty 2

## 2022-06-16 NOTE — Progress Notes (Unsigned)
Blood transfusion scheduled.

## 2022-06-16 NOTE — Patient Instructions (Signed)
Blood Transfusion, Adult, Care After After a blood transfusion, it is common to have: Bruising and soreness at the IV site. A headache. Follow these instructions at home: Your doctor may give you more instructions. If you have problems, contact your doctor. Insertion site care     Follow instructions from your doctor about how to take care of your insertion site. This is where an IV tube was put into your vein. Make sure you: Wash your hands with soap and water for at least 20 seconds before and after you change your bandage. If you cannot use soap and water, use hand sanitizer. Change your bandage as told by your doctor. Check your insertion site every day for signs of infection. Check for: Redness, swelling, or pain. Bleeding from the site. Warmth. Pus or a bad smell. General instructions Take over-the-counter and prescription medicines only as told by your doctor. Rest as told by your doctor. Go back to your normal activities as told by your doctor. Keep all follow-up visits. You may need to have tests at certain times to check your blood. Contact a doctor if: You have itching or red, swollen areas of skin (hives). You have a fever or chills. You have pain in the head, back, or chest. You feel worried or nervous (anxious). You feel weak after doing your normal activities. You have any of these problems at the insertion site: Redness, swelling, warmth, or pain. Bleeding that does not stop with pressure. Pus or a bad smell. If you received your blood transfusion in an outpatient setting, you will be told whom to contact to report any reactions. Get help right away if: You have signs of a serious reaction. This may be coming from an allergy or the body's defense system (immune system). Signs include: Trouble breathing or shortness of breath. Swelling of the face or feeling warm (flushed). A widespread rash. Dark pee (urine) or blood in the pee. Fast heartbeat. These symptoms  may be an emergency. Get help right away. Call 911. Do not wait to see if the symptoms will go away. Do not drive yourself to the hospital. Summary Bruising and soreness at the IV site are common. Check your insertion site every day for signs of infection. Rest as told by your doctor. Go back to your normal activities as told by your doctor. Get help right away if you have signs of a serious reaction. This information is not intended to replace advice given to you by your health care provider. Make sure you discuss any questions you have with your health care provider. Document Revised: 06/11/2021 Document Reviewed: 06/11/2021 Elsevier Patient Education  [place].   Hypokalemia Hypokalemia means that the amount of potassium in the blood is lower than normal. Potassium is a mineral (electrolyte) that helps regulate the amount of fluid in the body. It also stimulates muscle tightening (contraction) and helps nerves work properly. Normally, most of the body's potassium is inside cells, and only a very small amount is in the blood. Because the amount in the blood is so small, minor changes to potassium levels in the blood can be life-threatening. What are the causes? This condition may be caused by: Antibiotic medicine. Diarrhea or vomiting. Taking too much of a medicine that helps you have a bowel movement (laxative) can cause diarrhea and lead to hypokalemia. Chronic kidney disease (CKD). Medicines that help the body get rid of excess fluid (diuretics). Eating disorders, such as anorexia or bulimia. Low magnesium levels in the  body. Sweating a lot. What are the signs or symptoms? Symptoms of this condition include: Weakness. Constipation. Fatigue. Muscle cramps. Mental confusion. Skipped heartbeats or irregular heartbeat (palpitations). Tingling or numbness. How is this diagnosed? This condition is diagnosed with a blood test. How is this treated? This condition may be  treated by: Taking potassium supplements. Adjusting the medicines that you take. Eating more foods that contain a lot of potassium. If your potassium level is very low, you may need to get potassium through an IV and be monitored in the hospital. Follow these instructions at home: Eating and drinking  Eat a healthy diet. A healthy diet includes fresh fruits and vegetables, whole grains, healthy fats, and lean proteins. If told, eat more foods that contain a lot of potassium. These include: Nuts, such as peanuts and pistachios. Seeds, such as sunflower seeds and pumpkin seeds. Peas, lentils, and lima beans. Whole grain and bran cereals and breads. Fresh fruits and vegetables, such as apricots, avocado, bananas, cantaloupe, kiwi, oranges, tomatoes, asparagus, and potatoes. Juices, such as orange, tomato, and prune. Lean meats, including fish. Milk and milk products, such as yogurt. General instructions Take over-the-counter and prescription medicines only as told by your health care provider. This includes vitamins, natural food products, and supplements. Keep all follow-up visits. This is important. Contact a health care provider if: You have weakness that gets worse. You feel your heart pounding or racing. You vomit. You have diarrhea. You have diabetes and you have trouble keeping your blood sugar in your target range. Get help right away if: You have chest pain. You have shortness of breath. You have vomiting or diarrhea that lasts for more than 2 days. You faint. These symptoms may be an emergency. Get help right away. Call 911. Do not wait to see if the symptoms will go away. Do not drive yourself to the hospital. Summary Hypokalemia means that the amount of potassium in the blood is lower than normal. This condition is diagnosed with a blood test. Hypokalemia may be treated by taking potassium supplements, adjusting the medicines that you take, or eating more foods that are  high in potassium. If your potassium level is very low, you may need to get potassium through an IV and be monitored in the hospital. This information is not intended to replace advice given to you by your health care provider. Make sure you discuss any questions you have with your health care provider. Document Revised: 11/26/2020 Document Reviewed: 11/26/2020 Elsevier Patient Education  [place].   Dehydration, Adult Dehydration is a condition in which there is not enough water or other fluids in the body. This happens when a person loses more fluids than they take in. Important organs cannot work right without the right amount of fluids. Any loss of fluids from the body can cause dehydration. Dehydration can be mild, worse, or very bad. It should be treated right away to keep it from getting very bad. What are the causes? Conditions that cause loss of water in the body. They include: Watery poop (diarrhea). Vomiting. Sweating a lot. Fever. Infection. Peeing (urinating) a lot. Not drinking enough fluids. Certain medicines, such as medicines that take extra fluid out of the body (diuretics). Lack of safe drinking water. Not being able to get enough water and food. What increases the risk? Having a long-term (chronic) illness that has not been treated the right way, such as: Diabetes. Heart disease. Kidney disease. Being 42 years of age or older. Having  a disability. Living in a place that is high above the ground or sea (high in altitude). The thinner, drier air causes more fluid loss. Doing exercises that put stress on your body for a long time. Being active when in hot places. What are the signs or symptoms? Symptoms of dehydration depend on how bad it is. Mild or worse dehydration Thirst. Dry lips or dry mouth. Feeling dizzy or light-headed. Muscle cramps. Passing little pee or dark pee. Pee may be the color of tea. Headache. Very bad dehydration Changes in  skin. Skin may: Be cold to the touch (clammy). Be blotchy or pale. Not go back to normal right after you pinch it and let it go. Little or no tears, pee, or sweat. Fast breathing. Low blood pressure. Weak pulse. Pulse that is more than 100 beats a minute when you are sitting still. Other changes, such as: Feeling very thirsty. Eyes that look hollow (sunken). Cold hands and feet. Being confused. Being very tired (lethargic) or having trouble waking from sleep. Losing weight. Loss of consciousness. How is this treated? Treatment for this condition depends on how bad your dehydration is. Treatment should start right away. Do not wait until your condition gets very bad. Very bad dehydration is an emergency. You will need to go to a hospital. Mild or worse dehydration can be treated at home. You may be asked to: Drink more fluids. Drink an oral rehydration solution (ORS). This drink gives you the right amount of fluids, salts, and minerals (electrolytes). Very bad dehydration can be treated: With fluids through an IV tube. By correcting low levels of electrolytes in the body. By treating the problem that caused your dehydration. Follow these instructions at home: Oral rehydration solution If told by your doctor, drink an ORS: Make an ORS. Use instructions on the package. Start by drinking small amounts, about  cup (120 mL) every 5-10 minutes. Slowly drink more until you have had the amount that your doctor said to have.  Eating and drinking  Drink enough clear fluid to keep your pee pale yellow. If you were told to drink an ORS, finish the ORS first. Then, start slowly drinking other clear fluids. Drink fluids such as: Water. Do not drink only water. Doing that can make the salt (sodium) level in your body get too low. Water from ice chips you suck on. Fruit juice that you have added water to (diluted). Low-calorie sports drinks. Eat foods that have the right amounts of salts and  minerals, such as bananas, oranges, potatoes, tomatoes, or spinach. Do not drink alcohol. Avoid drinks that have caffeine or sugar. These include:: High-calorie sports drinks. Fruit juice that you did not add water to. Soda. Coffee or energy drinks. Avoid foods that are greasy or have a lot of fat or sugar. General instructions Take over-the-counter and prescription medicines only as told by your doctor. Do not take sodium tablets. Doing that can make the salt level in your body get too high. Return to your normal activities as told by your doctor. Ask your doctor what activities are safe for you. Keep all follow-up visits. Your doctor may check and change your treatment. Contact a doctor if: You have pain in your belly (abdomen) and the pain: Gets worse. Stays in one place. You have a rash. You have a stiff neck. You get angry or annoyed more easily than normal. You are more tired or have a harder time waking than normal. You feel weak or dizzy. You  feel very thirsty. Get help right away if: You have any symptoms of very bad dehydration. You vomit every time you eat or drink. Your vomiting gets worse, does not go away, or you vomit blood or green stuff. You are getting treatment, but symptoms are getting worse. You have a fever. You have a very bad headache. You have: Diarrhea that gets worse or does not go away. Blood in your poop (stool). This may cause poop to look black and tarry. No pee in 6-8 hours. Only a small amount of pee in 6-8 hours, and the pee is very dark. You have trouble breathing. These symptoms may be an emergency. Get help right away. Call 911. Do not wait to see if the symptoms will go away. Do not drive yourself to the hospital. This information is not intended to replace advice given to you by your health care provider. Make sure you discuss any questions you have with your health care provider. Document Revised: 10/11/2021 Document Reviewed:  10/11/2021 Elsevier Patient Education  [place].

## 2022-06-16 NOTE — Telephone Encounter (Signed)
CRITICAL VALUE STICKER  CRITICAL VALUE: Potassium-2.7          Total Bilirubin: 26.2  RECEIVER (on-site recipient of call): Tahj Njoku P. LPN  DATE & TIME NOTIFIED: 06/16/2022  MESSENGER (representative from lab): Lelan Pons   MD NOTIFIED: Loletha Grayer Heilingoetter, PA-C

## 2022-06-17 ENCOUNTER — Telehealth: Payer: Self-pay | Admitting: Cardiology

## 2022-06-17 NOTE — Telephone Encounter (Signed)
Returned call to patient, he requests office visit for 06/22/22 be completed virtually if possible. He did not give reason for not being able to come in to office.  Will forward to Nicholes Rough, PA-C to see if she would be agreeable to virtual visit.

## 2022-06-17 NOTE — Telephone Encounter (Signed)
Spoke with patient and shared with him Tessa's reply regarding virtual visit on 06/22/22.   Patient states he has an oncology appt he needs to make and requested to reschedule office visit. Rescheduled for next available appt on 08/01/22.  Patient expressed appreciation for follow-up.

## 2022-06-17 NOTE — Telephone Encounter (Signed)
Patient calling to see if his appt on 3/27 can be switch to virtual. Please advise

## 2022-06-18 ENCOUNTER — Inpatient Hospital Stay: Payer: Commercial Managed Care - HMO

## 2022-06-18 DIAGNOSIS — Z807 Family history of other malignant neoplasms of lymphoid, hematopoietic and related tissues: Secondary | ICD-10-CM

## 2022-06-18 DIAGNOSIS — I10 Essential (primary) hypertension: Secondary | ICD-10-CM | POA: Diagnosis present

## 2022-06-18 DIAGNOSIS — E162 Hypoglycemia, unspecified: Secondary | ICD-10-CM | POA: Diagnosis present

## 2022-06-18 DIAGNOSIS — K831 Obstruction of bile duct: Secondary | ICD-10-CM | POA: Diagnosis present

## 2022-06-18 DIAGNOSIS — C7889 Secondary malignant neoplasm of other digestive organs: Secondary | ICD-10-CM | POA: Diagnosis present

## 2022-06-18 DIAGNOSIS — Z79899 Other long term (current) drug therapy: Secondary | ICD-10-CM

## 2022-06-18 DIAGNOSIS — C7951 Secondary malignant neoplasm of bone: Secondary | ICD-10-CM | POA: Diagnosis present

## 2022-06-18 DIAGNOSIS — D689 Coagulation defect, unspecified: Secondary | ICD-10-CM | POA: Diagnosis present

## 2022-06-18 DIAGNOSIS — C3431 Malignant neoplasm of lower lobe, right bronchus or lung: Secondary | ICD-10-CM | POA: Diagnosis present

## 2022-06-18 DIAGNOSIS — R180 Malignant ascites: Secondary | ICD-10-CM | POA: Diagnosis present

## 2022-06-18 DIAGNOSIS — Z9221 Personal history of antineoplastic chemotherapy: Secondary | ICD-10-CM

## 2022-06-18 DIAGNOSIS — Z515 Encounter for palliative care: Secondary | ICD-10-CM | POA: Diagnosis not present

## 2022-06-18 DIAGNOSIS — L0231 Cutaneous abscess of buttock: Secondary | ICD-10-CM | POA: Diagnosis present

## 2022-06-18 DIAGNOSIS — K631 Perforation of intestine (nontraumatic): Secondary | ICD-10-CM | POA: Diagnosis present

## 2022-06-18 DIAGNOSIS — R6520 Severe sepsis without septic shock: Secondary | ICD-10-CM | POA: Diagnosis present

## 2022-06-18 DIAGNOSIS — Z7982 Long term (current) use of aspirin: Secondary | ICD-10-CM

## 2022-06-18 DIAGNOSIS — E876 Hypokalemia: Secondary | ICD-10-CM | POA: Diagnosis present

## 2022-06-18 DIAGNOSIS — R1030 Lower abdominal pain, unspecified: Secondary | ICD-10-CM | POA: Diagnosis not present

## 2022-06-18 DIAGNOSIS — K7200 Acute and subacute hepatic failure without coma: Secondary | ICD-10-CM | POA: Diagnosis present

## 2022-06-18 DIAGNOSIS — K611 Rectal abscess: Secondary | ICD-10-CM | POA: Diagnosis present

## 2022-06-18 DIAGNOSIS — Z87891 Personal history of nicotine dependence: Secondary | ICD-10-CM

## 2022-06-18 DIAGNOSIS — Z66 Do not resuscitate: Secondary | ICD-10-CM | POA: Diagnosis present

## 2022-06-18 DIAGNOSIS — D649 Anemia, unspecified: Secondary | ICD-10-CM

## 2022-06-18 DIAGNOSIS — K659 Peritonitis, unspecified: Secondary | ICD-10-CM | POA: Diagnosis present

## 2022-06-18 DIAGNOSIS — A4151 Sepsis due to Escherichia coli [E. coli]: Principal | ICD-10-CM | POA: Diagnosis present

## 2022-06-18 DIAGNOSIS — G893 Neoplasm related pain (acute) (chronic): Secondary | ICD-10-CM | POA: Diagnosis present

## 2022-06-18 DIAGNOSIS — C787 Secondary malignant neoplasm of liver and intrahepatic bile duct: Secondary | ICD-10-CM | POA: Diagnosis present

## 2022-06-18 DIAGNOSIS — E871 Hypo-osmolality and hyponatremia: Secondary | ICD-10-CM | POA: Diagnosis present

## 2022-06-18 DIAGNOSIS — E441 Mild protein-calorie malnutrition: Secondary | ICD-10-CM | POA: Diagnosis present

## 2022-06-18 DIAGNOSIS — K7290 Hepatic failure, unspecified without coma: Secondary | ICD-10-CM | POA: Diagnosis present

## 2022-06-18 DIAGNOSIS — J449 Chronic obstructive pulmonary disease, unspecified: Secondary | ICD-10-CM | POA: Diagnosis present

## 2022-06-18 DIAGNOSIS — M726 Necrotizing fasciitis: Secondary | ICD-10-CM | POA: Diagnosis present

## 2022-06-18 DIAGNOSIS — Z6830 Body mass index (BMI) 30.0-30.9, adult: Secondary | ICD-10-CM

## 2022-06-18 MED ORDER — SODIUM CHLORIDE 0.9% FLUSH
10.0000 mL | INTRAVENOUS | Status: AC | PRN
  Administered 2022-06-18: 10 mL

## 2022-06-18 MED ORDER — ACETAMINOPHEN 325 MG PO TABS
650.0000 mg | ORAL_TABLET | Freq: Once | ORAL | Status: AC
  Administered 2022-06-18: 650 mg via ORAL
  Filled 2022-06-18: qty 2

## 2022-06-18 MED ORDER — DIPHENHYDRAMINE HCL 25 MG PO CAPS
25.0000 mg | ORAL_CAPSULE | Freq: Once | ORAL | Status: AC
  Administered 2022-06-18: 25 mg via ORAL
  Filled 2022-06-18: qty 1

## 2022-06-18 MED ORDER — SODIUM CHLORIDE 0.9% IV SOLUTION
250.0000 mL | Freq: Once | INTRAVENOUS | Status: AC
  Administered 2022-06-18: 250 mL via INTRAVENOUS

## 2022-06-18 MED ORDER — HEPARIN SOD (PORK) LOCK FLUSH 100 UNIT/ML IV SOLN
500.0000 [IU] | Freq: Every day | INTRAVENOUS | Status: AC | PRN
  Administered 2022-06-18: 500 [IU]

## 2022-06-18 NOTE — Patient Instructions (Signed)

## 2022-06-19 LAB — TYPE AND SCREEN
ABO/RH(D): A NEG
Antibody Screen: NEGATIVE
Unit division: 0
Unit division: 0

## 2022-06-19 LAB — BPAM RBC
Blood Product Expiration Date: 202403312359
Blood Product Expiration Date: 202404012359
ISSUE DATE / TIME: 202403211441
ISSUE DATE / TIME: 202403231053
Unit Type and Rh: 600
Unit Type and Rh: 600

## 2022-06-20 ENCOUNTER — Emergency Department (HOSPITAL_COMMUNITY): Payer: Commercial Managed Care - HMO

## 2022-06-20 ENCOUNTER — Inpatient Hospital Stay (HOSPITAL_COMMUNITY)
Admission: EM | Admit: 2022-06-20 | Discharge: 2022-06-27 | DRG: 951 | Disposition: E | Payer: Commercial Managed Care - HMO | Attending: Internal Medicine | Admitting: Internal Medicine

## 2022-06-20 ENCOUNTER — Encounter: Payer: Self-pay | Admitting: Internal Medicine

## 2022-06-20 ENCOUNTER — Encounter: Payer: Self-pay | Admitting: Physician Assistant

## 2022-06-20 ENCOUNTER — Other Ambulatory Visit: Payer: Self-pay

## 2022-06-20 ENCOUNTER — Ambulatory Visit: Payer: Self-pay | Admitting: *Deleted

## 2022-06-20 ENCOUNTER — Encounter (HOSPITAL_COMMUNITY): Payer: Self-pay

## 2022-06-20 DIAGNOSIS — C7889 Secondary malignant neoplasm of other digestive organs: Secondary | ICD-10-CM | POA: Diagnosis present

## 2022-06-20 DIAGNOSIS — R180 Malignant ascites: Secondary | ICD-10-CM | POA: Diagnosis present

## 2022-06-20 DIAGNOSIS — D649 Anemia, unspecified: Secondary | ICD-10-CM

## 2022-06-20 DIAGNOSIS — E441 Mild protein-calorie malnutrition: Secondary | ICD-10-CM | POA: Diagnosis present

## 2022-06-20 DIAGNOSIS — K659 Peritonitis, unspecified: Secondary | ICD-10-CM | POA: Diagnosis present

## 2022-06-20 DIAGNOSIS — Z79899 Other long term (current) drug therapy: Secondary | ICD-10-CM | POA: Diagnosis not present

## 2022-06-20 DIAGNOSIS — K7290 Hepatic failure, unspecified without coma: Secondary | ICD-10-CM | POA: Diagnosis present

## 2022-06-20 DIAGNOSIS — C801 Malignant (primary) neoplasm, unspecified: Secondary | ICD-10-CM | POA: Diagnosis not present

## 2022-06-20 DIAGNOSIS — C787 Secondary malignant neoplasm of liver and intrahepatic bile duct: Secondary | ICD-10-CM | POA: Diagnosis present

## 2022-06-20 DIAGNOSIS — Z95828 Presence of other vascular implants and grafts: Secondary | ICD-10-CM

## 2022-06-20 DIAGNOSIS — K631 Perforation of intestine (nontraumatic): Secondary | ICD-10-CM | POA: Diagnosis present

## 2022-06-20 DIAGNOSIS — Z9221 Personal history of antineoplastic chemotherapy: Secondary | ICD-10-CM

## 2022-06-20 DIAGNOSIS — I10 Essential (primary) hypertension: Secondary | ICD-10-CM | POA: Diagnosis present

## 2022-06-20 DIAGNOSIS — C3490 Malignant neoplasm of unspecified part of unspecified bronchus or lung: Secondary | ICD-10-CM | POA: Diagnosis not present

## 2022-06-20 DIAGNOSIS — Z66 Do not resuscitate: Secondary | ICD-10-CM | POA: Diagnosis present

## 2022-06-20 DIAGNOSIS — G893 Neoplasm related pain (acute) (chronic): Secondary | ICD-10-CM | POA: Diagnosis not present

## 2022-06-20 DIAGNOSIS — J449 Chronic obstructive pulmonary disease, unspecified: Secondary | ICD-10-CM | POA: Diagnosis present

## 2022-06-20 DIAGNOSIS — L0231 Cutaneous abscess of buttock: Secondary | ICD-10-CM | POA: Diagnosis present

## 2022-06-20 DIAGNOSIS — Z6830 Body mass index (BMI) 30.0-30.9, adult: Secondary | ICD-10-CM | POA: Diagnosis not present

## 2022-06-20 DIAGNOSIS — C3431 Malignant neoplasm of lower lobe, right bronchus or lung: Secondary | ICD-10-CM | POA: Diagnosis present

## 2022-06-20 DIAGNOSIS — M726 Necrotizing fasciitis: Secondary | ICD-10-CM | POA: Diagnosis present

## 2022-06-20 DIAGNOSIS — R6520 Severe sepsis without septic shock: Secondary | ICD-10-CM | POA: Diagnosis present

## 2022-06-20 DIAGNOSIS — E876 Hypokalemia: Principal | ICD-10-CM | POA: Diagnosis present

## 2022-06-20 DIAGNOSIS — Z7189 Other specified counseling: Secondary | ICD-10-CM

## 2022-06-20 DIAGNOSIS — C7951 Secondary malignant neoplasm of bone: Secondary | ICD-10-CM | POA: Diagnosis present

## 2022-06-20 DIAGNOSIS — E162 Hypoglycemia, unspecified: Secondary | ICD-10-CM | POA: Diagnosis present

## 2022-06-20 DIAGNOSIS — E871 Hypo-osmolality and hyponatremia: Secondary | ICD-10-CM | POA: Diagnosis present

## 2022-06-20 DIAGNOSIS — A4151 Sepsis due to Escherichia coli [E. coli]: Secondary | ICD-10-CM | POA: Diagnosis present

## 2022-06-20 DIAGNOSIS — Z7982 Long term (current) use of aspirin: Secondary | ICD-10-CM

## 2022-06-20 DIAGNOSIS — K611 Rectal abscess: Secondary | ICD-10-CM | POA: Diagnosis present

## 2022-06-20 DIAGNOSIS — D689 Coagulation defect, unspecified: Secondary | ICD-10-CM | POA: Diagnosis present

## 2022-06-20 DIAGNOSIS — Z807 Family history of other malignant neoplasms of lymphoid, hematopoietic and related tissues: Secondary | ICD-10-CM

## 2022-06-20 DIAGNOSIS — K831 Obstruction of bile duct: Secondary | ICD-10-CM | POA: Diagnosis present

## 2022-06-20 DIAGNOSIS — Z515 Encounter for palliative care: Secondary | ICD-10-CM | POA: Diagnosis not present

## 2022-06-20 DIAGNOSIS — E46 Unspecified protein-calorie malnutrition: Secondary | ICD-10-CM

## 2022-06-20 DIAGNOSIS — K7200 Acute and subacute hepatic failure without coma: Secondary | ICD-10-CM | POA: Diagnosis present

## 2022-06-20 DIAGNOSIS — A419 Sepsis, unspecified organism: Secondary | ICD-10-CM | POA: Insufficient documentation

## 2022-06-20 DIAGNOSIS — R1030 Lower abdominal pain, unspecified: Secondary | ICD-10-CM | POA: Diagnosis present

## 2022-06-20 DIAGNOSIS — Z87891 Personal history of nicotine dependence: Secondary | ICD-10-CM

## 2022-06-20 DIAGNOSIS — R918 Other nonspecific abnormal finding of lung field: Secondary | ICD-10-CM | POA: Diagnosis present

## 2022-06-20 LAB — CBC WITH DIFFERENTIAL/PLATELET
Abs Immature Granulocytes: 2.2 10*3/uL — ABNORMAL HIGH (ref 0.00–0.07)
Band Neutrophils: 30 %
Basophils Absolute: 0 10*3/uL (ref 0.0–0.1)
Basophils Relative: 0 %
Eosinophils Absolute: 0 10*3/uL (ref 0.0–0.5)
Eosinophils Relative: 0 %
HCT: 23.4 % — ABNORMAL LOW (ref 39.0–52.0)
Hemoglobin: 7.6 g/dL — ABNORMAL LOW (ref 13.0–17.0)
Lymphocytes Relative: 6 %
Lymphs Abs: 1.9 10*3/uL (ref 0.7–4.0)
MCH: 29.3 pg (ref 26.0–34.0)
MCHC: 32.5 g/dL (ref 30.0–36.0)
MCV: 90.3 fL (ref 80.0–100.0)
Metamyelocytes Relative: 2 %
Monocytes Absolute: 0.6 10*3/uL (ref 0.1–1.0)
Monocytes Relative: 2 %
Myelocytes: 5 %
Neutro Abs: 26.4 10*3/uL — ABNORMAL HIGH (ref 1.7–7.7)
Neutrophils Relative %: 55 %
Platelets: 195 10*3/uL (ref 150–400)
RBC: 2.59 MIL/uL — ABNORMAL LOW (ref 4.22–5.81)
RDW: 23.1 % — ABNORMAL HIGH (ref 11.5–15.5)
WBC Morphology: INCREASED
WBC: 31 10*3/uL — ABNORMAL HIGH (ref 4.0–10.5)
nRBC: 6.5 % — ABNORMAL HIGH (ref 0.0–0.2)

## 2022-06-20 LAB — BLOOD GAS, VENOUS
Acid-Base Excess: 11.3 mmol/L — ABNORMAL HIGH (ref 0.0–2.0)
Bicarbonate: 35.8 mmol/L — ABNORMAL HIGH (ref 20.0–28.0)
O2 Saturation: 67.2 %
Patient temperature: 37
pCO2, Ven: 47 mmHg (ref 44–60)
pH, Ven: 7.49 — ABNORMAL HIGH (ref 7.25–7.43)
pO2, Ven: 40 mmHg (ref 32–45)

## 2022-06-20 LAB — LACTIC ACID, PLASMA
Lactic Acid, Venous: 1.7 mmol/L (ref 0.5–1.9)
Lactic Acid, Venous: 2.1 mmol/L (ref 0.5–1.9)

## 2022-06-20 LAB — COMPREHENSIVE METABOLIC PANEL
ALT: 136 U/L — ABNORMAL HIGH (ref 0–44)
AST: 85 U/L — ABNORMAL HIGH (ref 15–41)
Albumin: 1.7 g/dL — ABNORMAL LOW (ref 3.5–5.0)
Alkaline Phosphatase: 555 U/L — ABNORMAL HIGH (ref 38–126)
Anion gap: 10 (ref 5–15)
BUN: 15 mg/dL (ref 8–23)
CO2: 31 mmol/L (ref 22–32)
Calcium: 6.7 mg/dL — ABNORMAL LOW (ref 8.9–10.3)
Chloride: 90 mmol/L — ABNORMAL LOW (ref 98–111)
Creatinine, Ser: 0.37 mg/dL — ABNORMAL LOW (ref 0.61–1.24)
GFR, Estimated: >60 mL/min (ref >60)
Glucose, Bld: 105 mg/dL — ABNORMAL HIGH (ref 70–99)
Potassium: 2.7 mmol/L — CL (ref 3.5–5.1)
Sodium: 131 mmol/L — ABNORMAL LOW (ref 135–145)
Total Bilirubin: 26.8 mg/dL (ref 0.3–1.2)
Total Protein: 4.4 g/dL — ABNORMAL LOW (ref 6.5–8.1)

## 2022-06-20 LAB — URINALYSIS, ROUTINE W REFLEX MICROSCOPIC
Glucose, UA: NEGATIVE mg/dL
Ketones, ur: NEGATIVE mg/dL
Leukocytes,Ua: NEGATIVE
Nitrite: NEGATIVE
Protein, ur: 30 mg/dL — AB
Specific Gravity, Urine: >1.046 — ABNORMAL HIGH (ref 1.005–1.030)
pH: 6 (ref 5.0–8.0)

## 2022-06-20 LAB — LIPASE, BLOOD: Lipase: 21 U/L (ref 11–51)

## 2022-06-20 LAB — MAGNESIUM: Magnesium: 2.1 mg/dL (ref 1.7–2.4)

## 2022-06-20 LAB — PROTIME-INR
INR: 4.7 (ref 0.8–1.2)
Prothrombin Time: 44.1 seconds — ABNORMAL HIGH (ref 11.4–15.2)

## 2022-06-20 LAB — APTT: aPTT: 99 seconds — ABNORMAL HIGH (ref 24–36)

## 2022-06-20 MED ORDER — SODIUM CHLORIDE 0.9 % IV BOLUS
500.0000 mL | Freq: Once | INTRAVENOUS | Status: AC
  Administered 2022-06-20: 500 mL via INTRAVENOUS

## 2022-06-20 MED ORDER — GLYCOPYRROLATE 1 MG PO TABS: 1.0000 mg | ORAL_TABLET | ORAL | Status: DC | PRN

## 2022-06-20 MED ORDER — CLINDAMYCIN PHOSPHATE 600 MG/50ML IV SOLN: 600.0000 mg | Freq: Once | INTRAVENOUS | Status: DC

## 2022-06-20 MED ORDER — PIPERACILLIN-TAZOBACTAM 3.375 G IVPB 30 MIN
3.3750 g | Freq: Once | INTRAVENOUS | Status: AC
  Administered 2022-06-20: 3.375 g via INTRAVENOUS
  Filled 2022-06-20: qty 50

## 2022-06-20 MED ORDER — GLYCOPYRROLATE 0.2 MG/ML IJ SOLN: 0.2000 mg | INTRAMUSCULAR | Status: DC | PRN

## 2022-06-20 MED ORDER — HYDROMORPHONE HCL 2 MG/ML IJ SOLN
2.0000 mg | INTRAMUSCULAR | Status: DC
  Administered 2022-06-20 – 2022-06-21 (×3): 2 mg via INTRAVENOUS
  Filled 2022-06-20 (×3): qty 1

## 2022-06-20 MED ORDER — FENTANYL CITRATE PF 50 MCG/ML IJ SOSY: 50.0000 ug | PREFILLED_SYRINGE | Freq: Once | INTRAMUSCULAR | Status: DC

## 2022-06-20 MED ORDER — LORAZEPAM 2 MG/ML PO CONC
1.0000 mg | ORAL | Status: DC | PRN
  Administered 2022-06-20 – 2022-06-21 (×2): 1 mg via ORAL
  Filled 2022-06-20 (×2): qty 1

## 2022-06-20 MED ORDER — SODIUM CHLORIDE 0.9 % IV SOLN: INTRAVENOUS | Status: DC

## 2022-06-20 MED ORDER — VANCOMYCIN HCL 750 MG/150ML IV SOLN
750.0000 mg | Freq: Once | INTRAVENOUS | Status: AC
  Administered 2022-06-20: 750 mg via INTRAVENOUS
  Filled 2022-06-20: qty 150

## 2022-06-20 MED ORDER — POTASSIUM CHLORIDE 10 MEQ/100ML IV SOLN
10.0000 meq | INTRAVENOUS | Status: DC
  Administered 2022-06-20 (×3): 10 meq via INTRAVENOUS
  Filled 2022-06-20 (×3): qty 100

## 2022-06-20 MED ORDER — CLINDAMYCIN PHOSPHATE 900 MG/50ML IV SOLN
900.0000 mg | Freq: Once | INTRAVENOUS | Status: AC
  Administered 2022-06-20: 900 mg via INTRAVENOUS
  Filled 2022-06-20: qty 50

## 2022-06-20 MED ORDER — SODIUM CHLORIDE 0.9 % IV SOLN
2.0000 g | Freq: Once | INTRAVENOUS | Status: DC
  Filled 2022-06-20: qty 20

## 2022-06-20 MED ORDER — ACETAMINOPHEN 325 MG PO TABS: 650.0000 mg | ORAL_TABLET | Freq: Four times a day (QID) | ORAL | Status: DC | PRN

## 2022-06-20 MED ORDER — ACETAMINOPHEN 650 MG RE SUPP: 650.0000 mg | Freq: Four times a day (QID) | RECTAL | Status: DC | PRN

## 2022-06-20 MED ORDER — FENTANYL CITRATE PF 50 MCG/ML IJ SOSY
25.0000 ug | PREFILLED_SYRINGE | Freq: Once | INTRAMUSCULAR | Status: AC
  Administered 2022-06-20: 25 ug via INTRAVENOUS
  Filled 2022-06-20: qty 1

## 2022-06-20 MED ORDER — IOHEXOL 300 MG/ML  SOLN
100.0000 mL | Freq: Once | INTRAMUSCULAR | Status: AC | PRN
  Administered 2022-06-20: 100 mL via INTRAVENOUS

## 2022-06-20 MED ORDER — POLYVINYL ALCOHOL 1.4 % OP SOLN: 1.0000 [drp] | Freq: Four times a day (QID) | OPHTHALMIC | Status: DC | PRN

## 2022-06-20 MED ORDER — HYDROMORPHONE HCL 1 MG/ML IJ SOLN: 1.0000 mg | INTRAMUSCULAR | Status: DC | PRN

## 2022-06-20 MED ORDER — VANCOMYCIN HCL 1750 MG/350ML IV SOLN
1750.0000 mg | Freq: Once | INTRAVENOUS | Status: DC
  Filled 2022-06-20: qty 350

## 2022-06-20 MED ORDER — VANCOMYCIN HCL IN DEXTROSE 1-5 GM/200ML-% IV SOLN
1000.0000 mg | Freq: Once | INTRAVENOUS | Status: DC
  Administered 2022-06-20: 1000 mg via INTRAVENOUS
  Filled 2022-06-20: qty 200

## 2022-06-20 MED ORDER — SODIUM CHLORIDE (PF) 0.9 % IJ SOLN
INTRAMUSCULAR | Status: AC
  Filled 2022-06-20: qty 50

## 2022-06-20 NOTE — Consult Note (Addendum)
Consult Note  Matthew [name] April 04, 1960  [id number].    Requesting MD: Varney Biles, MD Chief Complaint/Reason for Consult: perineal infection   HPI:  Patient is a 62 year old male who presented to the ED with abdominal pain. Patient reports LLQ/suprapubic abdominal pain started about one week ago. Pain has progressively worsened and is now all across his lower abdomen . Pain radiates to the back. Associated weakness and malaise, increasing bloody rectal drainage and diarrhea. Known hx of hemorrhoids. Denies nausea or vomiting. Recently diagnosed in February this year with metastatic lung cancer with mets to spleen and liver. PMH otherwise significant for COPD, HTN, renal artery stenosis bilaterally. Prior abdominal surgery includes a hernia repair. No blood thinners at baseline. NKDA. Patient formerly smoked cigarettes, quit in 2019 but smoked 1 ppd prior to that. Last chemotherapy was 3 weeks ago and he is due for another treatment tomorrow. He lives alone. HIs mother is at the bedside.   ROS: As Above  Review of Systems  All other systems reviewed and are negative.   Family History  Problem Relation Age of Onset   Lymphoma Father    Cancer Mother        breast   Colon cancer Neg Hx    Rectal cancer Neg Hx    Stomach cancer Neg Hx     Past Medical History:  Diagnosis Date   Acute bronchitis 03/30/2017   Acute maxillary sinusitis 07/25/2017   Acute otitis media, right 05/05/2016   Acute respiratory failure with hypoxia ([place]) 03/30/2017   CAP (community acquired pneumonia) 03/31/2017   COPD (chronic obstructive pulmonary disease) ([place])    Diarrhea 10/16/2017   Diverticulitis    Ear build-up, left 08/10/2017   Hypertension    Left otitis media 07/17/2017   Pharyngitis 05/05/2016   Renal artery stenosis ([facility])    1 to 59% bilateral by Dopplers 12/2020   Sigmoid diverticulitis 02/11/2018    Past Surgical History:  Procedure Laterality Date   FINE NEEDLE  ASPIRATION  05/17/2022   Procedure: FINE NEEDLE ASPIRATION (FNA) LINEAR;  Surgeon: Garner Nash, DO;  Location: [facility];  Service: Cardiopulmonary;;   [place]   IR IMAGING GUIDED PORT INSERTION  06/09/2022   VIDEO BRONCHOSCOPY WITH ENDOBRONCHIAL ULTRASOUND N/A 05/17/2022   Procedure: VIDEO BRONCHOSCOPY WITH ENDOBRONCHIAL ULTRASOUND;  Surgeon: Garner Nash, DO;  Location: [facility];  Service: Cardiopulmonary;  Laterality: N/A;    Social History:  reports that he quit smoking about 5 years ago. His smoking use included cigarettes. He smoked an average of 1 pack per day. He has never used smokeless tobacco. He reports that he does not currently use drugs after having used the following drugs: Marijuana. Frequency: 2.00 times per week. No history on file for alcohol use.  Allergies: No Known Allergies  (Not in a hospital admission)   Blood pressure 114/63, pulse 98, temperature 98.6 F (37 C), temperature source Oral, resp. rate (!) 23, SpO2 93 %. Physical Exam:  General: ill appear male, severe jaundice, cooperative  HEENT: head is normocephalic, atraumatic.  Scleral icterus present  Heart: tachycardic, no m/r/g, pitting lower extremity edema  Lungs: CTAB, slightly labored respirations on [place] Abd: firm, protuberant, ecchymosis at umbilicus with small umbilical hernia, there is TTP globally with rebound tenderness, very tender over LLQ with guarding See below: there is a small skin opening just left of the 6 o'clock perianal region draining  purulent, bloody fluid. There is induration tracking anteriorly in the left perineum towards the scrotum. DRE without fluctuance or gross blood. ?small area of tissue necrosis in the 1 o'clock position  GU:  Physical Exam Genitourinary:      MS: all 4 extremities are symmetrical with no cyanosis, clubbing, or edema. Skin: warm and dry with no masses, lesions, or rashes Neuro: non-focal exam. Psych: A&Ox3 with  an appropriate affect.appears depressed    Results for orders placed or performed during the hospital encounter of 05/28/2022 (from the past 48 hour(s))  Comprehensive metabolic panel     Status: Abnormal   Collection Time: 06/13/2022  9:54 AM  Result Value Ref Range   Sodium 131 (L) 135 - 145 mmol/L   Potassium 2.7 (LL) 3.5 - 5.1 mmol/L    Comment: CRITICAL RESULT CALLED TO, READ BACK BY AND VERIFIED WITH DOWD,P. RN AT 1050 06/12/2022 MULLINS,T    Chloride 90 (L) 98 - 111 mmol/L   CO2 31 22 - 32 mmol/L   Glucose, Bld 105 (H) 70 - 99 mg/dL    Comment: Glucose reference range applies only to samples taken after fasting for at least 8 hours.   BUN 15 8 - 23 mg/dL   Creatinine, Ser 0.37 (L) 0.61 - 1.24 mg/dL    Comment: ICTERUS AT THIS LEVEL MAY AFFECT RESULT   Calcium 6.7 (L) 8.9 - 10.3 mg/dL   Total Protein 4.4 (L) 6.5 - 8.1 g/dL   Albumin 1.7 (L) 3.5 - 5.0 g/dL   AST 85 (H) 15 - 41 U/L   ALT 136 (H) 0 - 44 U/L   Alkaline Phosphatase 555 (H) 38 - 126 U/L   Total Bilirubin 26.8 (HH) 0.3 - 1.2 mg/dL    Comment: CRITICAL RESULT CALLED TO, READ BACK BY AND VERIFIED WITH DOWD,P. RN AT 1050 06/24/2022 MULLINS,T    GFR, Estimated >60 >60 mL/min    Comment: (NOTE) Calculated using the CKD-EPI Creatinine Equation (2021)    Anion gap 10 5 - 15    Comment: Performed at [facility], [place] [address] [place], [place] [address]  CBC with Differential     Status: Abnormal   Collection Time: 06/20/2022  9:54 AM  Result Value Ref Range   WBC 31.0 (H) 4.0 - 10.5 K/uL   RBC 2.59 (L) 4.22 - 5.81 MIL/uL   Hemoglobin 7.6 (L) 13.0 - 17.0 g/dL   HCT 23.4 (L) 39.0 - 52.0 %   MCV 90.3 80.0 - 100.0 fL   MCH 29.3 26.0 - 34.0 pg   MCHC 32.5 30.0 - 36.0 g/dL   RDW 23.1 (H) 11.5 - 15.5 %   Platelets 195 150 - 400 K/uL   nRBC 6.5 (H) 0.0 - 0.2 %   Neutrophils Relative % 55 %   Neutro Abs 26.4 (H) 1.7 - 7.7 K/uL   Band Neutrophils 30 %   Lymphocytes Relative 6 %   Lymphs Abs 1.9 0.7 - 4.0  K/uL   Monocytes Relative 2 %   Monocytes Absolute 0.6 0.1 - 1.0 K/uL   Eosinophils Relative 0 %   Eosinophils Absolute 0.0 0.0 - 0.5 K/uL   Basophils Relative 0 %   Basophils Absolute 0.0 0.0 - 0.1 K/uL   WBC Morphology INCREASED BANDS (>20% BANDS)     Comment: MILD LEFT SHIFT (1-5% METAS, OCC MYELO, OCC BANDS) TOXIC GRANULATION    Metamyelocytes Relative 2 %   Myelocytes 5 %   Abs Immature Granulocytes 2.20 (H)  0.00 - 0.07 K/uL   Polychromasia PRESENT    Target Cells PRESENT     Comment: Performed at [facility], [place] [address] [place], [place] [address]  Protime-INR     Status: Abnormal   Collection Time: 06/16/2022  9:54 AM  Result Value Ref Range   Prothrombin Time 44.1 (H) 11.4 - 15.2 seconds   INR 4.7 (HH) 0.8 - 1.2    Comment: CRITICAL RESULT CALLED TO, READ BACK BY AND VERIFIED WITH: GRANT, K. RN @1106  ON 3.25.2024 BY NMCCOY (NOTE) INR goal varies based on device and disease states. Performed at [facility], [place] [address], [place], [place] [address]   APTT     Status: Abnormal   Collection Time: 05/27/2022  9:54 AM  Result Value Ref Range   aPTT 99 (H) 24 - 36 seconds    Comment:        IF BASELINE aPTT IS ELEVATED, SUGGEST PATIENT RISK ASSESSMENT BE USED TO DETERMINE APPROPRIATE ANTICOAGULANT THERAPY. Performed at [facility], [place] [address], [place], [place] [address]   Blood gas, venous ([facility], [facility], [facility])     Status: Abnormal   Collection Time: 06/12/2022  9:54 AM  Result Value Ref Range   pH, Ven 7.49 (H) 7.25 - 7.43   pCO2, Ven 47 44 - 60 mmHg   pO2, Ven 40 32 - 45 mmHg   Bicarbonate 35.8 (H) 20.0 - 28.0 mmol/L   Acid-Base Excess 11.3 (H) 0.0 - 2.0 mmol/L   O2 Saturation 67.2 %   Patient temperature 37.0     Comment: Performed at [facility], [place] [address] [place], [place] [address]  Lipase, blood     Status: None   Collection Time: 06/08/2022  9:54 AM  Result Value Ref Range    Lipase 21 11 - 51 U/L    Comment: Performed at [facility], [place] [address] [place], [place] [address]  Magnesium     Status: None   Collection Time: 06/09/2022  9:54 AM  Result Value Ref Range   Magnesium 2.1 1.7 - 2.4 mg/dL    Comment: Performed at [facility], [place] [address], [place], [place] [address]  Lactic acid, plasma     Status: None   Collection Time: 05/28/2022 12:49 PM  Result Value Ref Range   Lactic Acid, Venous 1.7 0.5 - 1.9 mmol/L    Comment: ICTERUS AT THIS LEVEL MAY AFFECT RESULT Performed at [place] [address], [place], [place] [address]    CT ABDOMEN PELVIS W CONTRAST  Result Date: 06/07/2022 CLINICAL DATA:  Left gluteal abscess, perirectal abscess * Tracking Code: BO * EXAM: CT ABDOMEN AND PELVIS WITH CONTRAST TECHNIQUE: Multidetector CT imaging of the abdomen and pelvis was performed using the standard protocol following bolus administration of intravenous contrast. RADIATION DOSE REDUCTION: This exam was performed according to the departmental dose-optimization program which includes automated exposure control, adjustment of the mA and/or kV according to patient size and/or use of iterative reconstruction technique. CONTRAST:  155mL OMNIPAQUE IOHEXOL 300 MG/ML  SOLN COMPARISON:  CT abdomen pelvis, 05/16/2022 MR abdomen, 05/18/2022 FINDINGS: Lower chest: No acute abnormality. Coronary artery calcifications. Partially imaged right lower lobe mass (series 2, image 3). Hepatobiliary: Hepatomegaly, maximum coronal span 22.7 cm. No significant change in innumerable hypodense metastatic lesions throughout the liver. No gallstones, gallbladder wall thickening, or biliary dilatation. Pancreas: Unremarkable. No pancreatic ductal dilatation or surrounding inflammatory changes. Spleen:  Normal in size. Unchanged hypodense splenic metastasis (series 2, image 23). Adrenals/Urinary Tract: Adrenal glands are unremarkable. Kidneys  are normal, without renal calculi, solid lesion, or hydronephrosis. Distended urinary bladder. Stomach/Bowel: Stomach is within normal limits. Appendix appears normal. Descending and sigmoid diverticulosis. Pericolonic mass or fluid collection of the mid sigmoid colon adjacent to the bladder dome measuring proximally 3.7 x 2.7 cm (series 2, image 60). Vascular/Lymphatic: Aortic atherosclerosis. No enlarged abdominal or pelvic lymph nodes. Reproductive: No mass or other significant abnormality. Other: No abdominal wall hernia. Anasarca. Small volume perihepatic ascites. Extensive subcutaneous emphysema extending from the medial left gluteal subcutaneous fat about the left aspect of the low rectum and anus, into the left aspect of the pelvis, pelvic sidewall, and inner table of the left lower quadrant abdominal wall (series 2, image 73, 79, 85, 91). Musculoskeletal: No acute osseous findings. Lytic lesion of the superior endplate of L3 (series 7, image 77). IMPRESSION: 1. Extensive soft tissue emphysema extending from the medial left gluteal subcutaneous fat about the left aspect of the low rectum and anus, into the left aspect of the pelvis, pelvic sidewall, and inner table of the left lower quadrant abdominal wall. Findings are consistent with gas-forming infection, possibly originating from the rectum. 2. Pericolonic mass or fluid collection of the mid sigmoid colon measuring 3.7 x 2.7 cm. This may reflect an abscess or phlegmon, or alternately a metastatic lesion, but is new compared to CT dated 05/16/2022. This is of uncertain relation to gas-forming process described above. 3. No significant change in innumerable hypodense metastatic lesions throughout the liver. 4. Unchanged hypodense splenic metastasis. 5. Unchanged osseous lytic lesion of the superior endplate of L3. 6. Partially imaged right lower lobe mass. 7. Small volume perihepatic ascites and anasarca. 8. Coronary artery disease. These results were  called by telephone at the time of interpretation on 06/15/2022 at 2:38 pm to Dr. Varney Biles , who verbally acknowledged these results. Aortic Atherosclerosis (ICD10-I70.0). Electronically Signed   By: Delanna Ahmadi M.D.   On: 06/18/2022 14:45   DG Chest Port 1 View  Result Date: 05/28/2022 CLINICAL DATA:  Questionable sepsis. EXAM: PORTABLE CHEST 1 VIEW COMPARISON:  May 16, 2022 FINDINGS: Injectable port terminates at the expected location of the cavoatrial junction. The heart size and mediastinal contours are within normal limits. Both lungs are clear. The visualized skeletal structures are unremarkable. IMPRESSION: No active disease. Electronically Signed   By: Fidela Salisbury M.D.   On: 06/13/2022 09:42      Assessment/Plan 62 y/o M w/ NSTI of left gluteal tissues vs sigmoid mass with necrosis and perforation into gluteal tissues. Also has recent dx of metastatic lung CA with mets to spleen and liver - on chemotherapy - WBC 31K, lactic 1.7, afebrile, BP soft  - significant electrolyte derangements - CT with above differential; on exam he has a necrotizing soft tissue infection that would benefit from surgical debridement. He also has peritoneal signs on physical exam - hard to differentiate his abdominal exam - he has what appears to be a contained sigmoid perforation with abscess on CT, without gross free air but his metastatic cancer and necrotizing groin infection make it hard to know if his intra-abdominal process warrants surgery at this time. Unfortunately unless his coagulopathy (INR 4.7) can be reversed he is not a surgical candidate. Based on the ACS college of surgeons his risk of perioperative mortality is 71%, and based on his current coagulopathy and intra-abdominal infection I suspect this is higher.  multidisciplinary discussions with CCM, ED physician, and my attending. Unfortunately his coagulopathy is likely not reversible and his risk of post-operative complications  and death are high. Patient has elected to proceed with comfort care.    nursing notes, ED provider notes, Consultant CCM notes, last 24 h vitals and pain scores, last 48 h intake and output, last 24 h labs and trends, and last 24 h imaging results.   Obie Dredge, [facility] 06/05/2022, 3:13 PM Please see Amion for pager number during day hours 7:00am-4:30pm

## 2022-06-20 NOTE — ED Triage Notes (Signed)
Pt arrived via EMS, from home. C/o lower abd pain, worse on the left but radiating all the way across. States feels like a "golf ball lump" in his stomach. States had some loose stools, saw some blood in stool but has hemorrhoids. Denies any n/v  Currently undergoing tx for liver CA

## 2022-06-20 NOTE — ED Notes (Signed)
CT was contacted to confirm that pts power point could be accessed in CT. CT confirmed that pts power port could be used.

## 2022-06-20 NOTE — Telephone Encounter (Signed)
Reason for Disposition  [1] SEVERE pain AND [2] age > 60 years  Answer Assessment - Initial Assessment Questions 1. LOCATION: "Where does it hurt?"      Abd pain and hemorrhoids   I'm having diarrhea.   I'm being treated by Dr. [facility].  They don't know what is causing the diarrhea. I'm taking Imodium they prescribed.   I think I have a big whelp on the side of your bottom.  It feels like a golf ball.    When I wipe I see blood.   This has been going on for several days.    I got a transfusion a few days ago.   He is treating me for cancer.   Lung and liver cancer.   I'm on chemo.   Not on blood thinners.    2. RADIATION: "Does the pain shoot anywhere else?" (e.g., chest, back)     Abd pain, rectal bleeding, and hemorrhoids. 3. ONSET: "When did the pain begin?" (Minutes, hours or days ago)      Started before the weekend.    Bright red blood.   The whelp in my butt is bothering me. 4. SUDDEN: "Gradual or sudden onset?"     Not asked 5. PATTERN "Does the pain come and go, or is it constant?"    - If it comes and goes: "How long does it last?" "Do you have pain now?"     (Note: Comes and goes means the pain is intermittent. It goes away completely between bouts.)    - If constant: "Is it getting better, staying the same, or getting worse?"      (Note: Constant means the pain never goes away completely; most serious pain is constant and gets worse.)      Constant 6. SEVERITY: "How bad is the pain?"  (e.g., Scale 1-10; mild, moderate, or severe)    - MILD (1-3): Doesn't interfere with normal activities, abdomen soft and not tender to touch.     - MODERATE (4-7): Interferes with normal activities or awakens from sleep, abdomen tender to touch.     - SEVERE (8-10): Excruciating pain, doubled over, unable to do any normal activities.       "I'm in a lot of pain" 7. RECURRENT SYMPTOM: "Have you ever had this type of stomach pain before?" If Yes, ask: "When was the last time?" and "What  happened that time?"      He is on chemo for lung and liver cancer.   He has not contacted his oncologist.   He called in on the [employer]. 8. CAUSE: "What do you think is causing the stomach pain?"     I think the hemorrhoids.  9. RELIEVING/AGGRAVATING FACTORS: "What makes it better or worse?" (e.g., antacids, bending or twisting motion, bowel movement)     Taking Imodium but it's not helping the diarrhea 10. OTHER SYMPTOMS: "Do you have any other symptoms?" (e.g., back pain, diarrhea, fever, urination pain, vomiting)       Diarrhea, blood on tissue after wiping  Protocols used: Abdominal Pain - Male-A-AH

## 2022-06-20 NOTE — Progress Notes (Signed)
[place] received page to visit patient. Patient was alert and oriented upon arrival. Mother was bedside. Able to speak but did not share much. Recently placed on palliative care. Spiritual care was provided by being a non-anxious presence. Recited Psalms 78 and prayed for patient and family upon request. Desires follow up visits. Expecting daughter and son-in-law to visit tomorrow.   Marilynn Rail, M.Div. Healthcare Chaplain

## 2022-06-20 NOTE — ED Notes (Signed)
Called pharmacy to consult about giving Vancomycin and Rocephin together. They advised to pause Vanc long enough to give dose of Rocephin and flush line with a 10 mL syringe prior to switching.

## 2022-06-20 NOTE — ED Notes (Signed)
Blood culture obtained via 21 gauge butterfly from L hand and R wrist, pt tolerated well, samples sent to lab.

## 2022-06-20 NOTE — ED Notes (Signed)
D/c ns bolus per md instructions

## 2022-06-20 NOTE — Consult Note (Signed)
NAME:  Matthew [name], MRN:  [id number], DOB:  [birth year], LOS: 0 ADMISSION DATE:  06/17/2022, CONSULTATION DATE:  06/16/2022 REFERRING MD:  Bjorn Pippin, MD CHIEF COMPLAINT:  Sepsis   History of Present Illness:  Matthew [name] is a 62 year old male with recently diagnosed metastatic lung cancer to the spleen and liver s/p chemotherapy 3 weeks ago who presented to [facility] ER with a few days of lower abdominal pain, malaise and weakness.   Labs showed T. Bili 26.8, INR 4.4. WBC 31K, plts 195, K 2.7, Na 131. CT Abdomen pelvis showed extensive soft tissue emphysema extending from the medial left gluteal subcutaneous fat about the left aspect of the low rectum and anus and into the left aspect of the pelvis, pelvic wall and inner table of the left quadrant abdominal wall. Pericolonic mass or fluid collection of the mid sigmoid colon measuring 3.7 x 2.7cm, may reflect abscess or phlegmon, or new metastatic lesion.   PCCM and General Surgery Consulted for further evaluation. Discussion held at the bedside regarding need for operation given the concern for necrotizing fascitis but the inability to perform the procedure due to his coagulation issues. We also discussed the difficulties of medically treating his coagulation issues. We discussed the significant risk for dying despite these aggressive treatments. Ultimately it was recommended not to proceed with surgery and further aggressive medical management given the poor overall prognosis and to maximize his comfort/quality of life at this time. Patient and his mother agreed with moving towards a palliative/hospice care route.   Pertinent  Medical History   Past Medical History:  Diagnosis Date   Acute bronchitis 03/30/2017   Acute maxillary sinusitis 07/25/2017   Acute otitis media, right 05/05/2016   Acute respiratory failure with hypoxia ([place]) 03/30/2017   CAP (community acquired pneumonia) 03/31/2017   COPD (chronic obstructive pulmonary disease)  ([place])    Diarrhea 10/16/2017   Diverticulitis    Ear build-up, left 08/10/2017   Hypertension    Left otitis media 07/17/2017   Pharyngitis 05/05/2016   Renal artery stenosis (HCC)    1 to 59% bilateral by Dopplers 12/2020   Sigmoid diverticulitis 02/11/2018     Significant Hospital Events: Including procedures, antibiotic start and stop dates in addition to other pertinent events   3/25 presented to [facility]. PCCM and General Surgery consulted.   Interim History / Subjective:  As above in hpi  Objective   Blood pressure 114/63, pulse 98, temperature 98.6 F (37 C), temperature source Oral, resp. rate (!) 23, SpO2 93 %.        Intake/Output Summary (Last 24 hours) at 06/03/2022 1610 Last data filed at 06/12/2022 1558 Gross per 24 hour  Intake 498.6 ml  Output --  Net 498.6 ml   There were no vitals filed for this visit.  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  - Patient to be  admitted to [facility] - Palliative care consulted, patient wishes to move towards hospice care - Patient is now DNR/DNI  Please call with any questions.   Best Practice (right click and "Reselect all SmartList Selections" daily)   Per Primary Team  Labs   CBC: Recent Labs  Lab 06/16/22 1044 06/22/2022 0954  WBC 28.0* 31.0*  NEUTROABS 17.3* 26.4*  HGB 6.5* 7.6*  HCT 19.6* 23.4*  MCV 92.9 90.3  PLT 102* 0000000    Basic Metabolic Panel: Recent Labs  Lab 06/16/22 1044 06/07/2022 0954  NA 135 131*  K 2.7* 2.7*  CL 93* 90*  CO2 35* 31  GLUCOSE 120* 105*  BUN 16 15  CREATININE 0.84 0.37*  CALCIUM 7.0* 6.7*  MG  --  2.1   GFR: Estimated Creatinine Clearance: 97 mL/min (A) (by C-G formula based on SCr of 0.37 mg/dL (L)). Recent Labs  Lab 06/16/22 1044 06/19/2022 0954 05/29/2022 1249  WBC 28.0* 31.0*  --   LATICACIDVEN  --   --  1.7    Liver Function Tests: Recent Labs  Lab 06/16/22 1044 05/31/2022 0954  AST 106* 85*  ALT 175* 136*  ALKPHOS 838* 555*  BILITOT 26.2* 26.8*  PROT 4.1* 4.4*  ALBUMIN 2.7* 1.7*   Recent Labs  Lab 06/23/2022 0954  LIPASE 21   No results for input(s): "AMMONIA" in the last 168 hours.  ABG    Component Value Date/Time   HCO3 35.8 (H) 06/07/2022 0954   O2SAT 67.2 06/14/2022 0954     Coagulation Profile: Recent Labs  Lab 06/13/2022 0954  INR 4.7*    Cardiac Enzymes: No results for input(s): "CKTOTAL", "CKMB", "CKMBINDEX", "TROPONINI" in the last 168 hours.  HbA1C: Hgb A1c MFr Bld  Date/Time Value Ref Range Status  05/17/2022 12:39 AM 6.1 (H) 4.8 - 5.6 % Final    Comment:    (NOTE) Pre diabetes:          5.7%-6.4%  Diabetes:              >6.4%  Glycemic control for   <7.0% adults with diabetes   09/13/2019 08:53 AM 6.4 (H) 4.8 - 5.6 % Final    Comment:             Prediabetes: 5.7 - 6.4          Diabetes: >6.4          Glycemic control for adults with diabetes: <7.0     CBG: No results for input(s): "GLUCAP" in the last  168 hours.  Review of Systems:   Review of Systems  Constitutional:  Positive for malaise/fatigue. Negative for chills, fever and weight loss.  HENT:  Negative for congestion, sinus pain and sore throat.   Eyes: Negative.   Respiratory:  Negative for cough, hemoptysis, sputum production, shortness of breath and wheezing.   Cardiovascular:  Positive for leg swelling. Negative for chest pain, palpitations, orthopnea and claudication.  Gastrointestinal:  Positive for abdominal pain. Negative for heartburn, nausea and vomiting.  Genitourinary: Negative.   Musculoskeletal:  Negative for joint pain and myalgias.  Skin:  Negative for rash.  Neurological:  Negative for weakness.  Endo/Heme/Allergies: Negative.   Psychiatric/Behavioral: Negative.       Past Medical History:  He,  has  a past medical history of Acute bronchitis (03/30/2017), Acute maxillary sinusitis (07/25/2017), Acute otitis media, right (05/05/2016), Acute respiratory failure with hypoxia ([place]) (03/30/2017), CAP (community acquired pneumonia) (03/31/2017), COPD (chronic obstructive pulmonary disease) ([place]), Diarrhea (10/16/2017), Diverticulitis, Ear build-up, left (08/10/2017), Hypertension, Left otitis media (07/17/2017), Pharyngitis (05/05/2016), Renal artery stenosis ([place]), and Sigmoid diverticulitis (02/11/2018).   Surgical History:   Past Surgical History:  Procedure Laterality Date   FINE NEEDLE ASPIRATION  05/17/2022   Procedure: FINE NEEDLE ASPIRATION (FNA) LINEAR;  Surgeon: Garner Nash, DO;  Location: [place];  Service: Cardiopulmonary;;   HERNIA REPAIR     HERNIA REPAIR  1966   IR IMAGING GUIDED PORT INSERTION  06/09/2022   VIDEO BRONCHOSCOPY WITH ENDOBRONCHIAL ULTRASOUND N/A 05/17/2022   Procedure: VIDEO BRONCHOSCOPY WITH ENDOBRONCHIAL ULTRASOUND;  Surgeon: Garner Nash, DO;  Location: [place];  Service: Cardiopulmonary;  Laterality: N/A;     Social History:   reports that he quit smoking about  5 years ago. His smoking use included cigarettes. He smoked an average of 1 pack per day. He has never used smokeless tobacco. He reports that he does not currently use drugs after having used the following drugs: Marijuana. Frequency: 2.00 times per week.   Family History:  His family history includes Cancer in his mother; Lymphoma in his father. There is no history of Colon cancer, Rectal cancer, or Stomach cancer.   Allergies No Known Allergies   Home Medications  Prior to Admission medications   Medication Sig Start Date End Date Taking? Authorizing Provider  traZODone (DESYREL) 50 MG tablet Take 50 mg by mouth at bedtime. 06/17/22  Yes [provider]  albuterol (VENTOLIN HFA) 108 (90 Base) MCG/ACT inhaler 1 puff as needed Inhalation every 4 hrs for 30 days 06/08/22   [provider]  amLODipine (NORVASC) 10 MG tablet TAKE 1 TABLET BY MOUTH DAILY Patient taking differently: Take 10 mg by mouth at bedtime. 04/12/22   Sueanne Margarita, MD  aspirin EC 81 MG tablet Take 1 tablet (81 mg total) by mouth daily. Swallow whole. 05/31/21   Belva Crome, MD  benzonatate (TESSALON) 100 MG capsule Take 1 capsule (100 mg total) by mouth 3 (three) times daily as needed for cough. 06/09/22   Heilingoetter, Cassandra L, PA-C  clonazePAM (KLONOPIN) 0.5 MG tablet 1 tablet at bedtime as needed Orally Once a day    [provider]  diphenoxylate-atropine (LOMOTIL) 2.5-0.025 MG tablet Take 1 tablet by mouth 4 (four) times daily as needed for diarrhea or loose stools. 06/16/22   Heilingoetter, Cassandra L, PA-C  eszopiclone (LUNESTA) 2 MG TABS tablet Take 2 mg by mouth at bedtime as needed for sleep. Take immediately before bedtime    [provider]  famotidine-calcium carbonate-magnesium hydroxide (PEPCID COMPLETE) 10-800-165 MG chewable tablet Chew 1 tablet by mouth daily as needed (indigestion, heartburn).    [provider]  furosemide (LASIX) 20 MG tablet Take 1 tablet  (20 mg total) by mouth daily. 05/18/22 08/16/22  [place] Donnamarie Poag, DO  ibuprofen (ADVIL) 200 MG tablet Take 600 mg by mouth daily as needed for headache, mild pain or moderate pain.    [provider]  lidocaine-prilocaine (EMLA) cream Apply 1 Application topically as needed. 05/31/22   Heilingoetter, Cassandra L, PA-C  LORazepam (ATIVAN) 0.5 MG tablet Take 1 tablet (0.5 mg total) by mouth every 6 (six) hours as needed for anxiety. 05/18/22   [place] Donnamarie Poag, DO  metoprolol succinate (TOPROL-XL) 100 MG  24 hr tablet Take 1 tablet (100 mg total) by mouth daily. Patient taking differently: Take 100 mg by mouth at bedtime. 04/06/22   Sueanne Margarita, MD  nitroGLYCERIN (NITROSTAT) 0.4 MG SL tablet Place 1 tablet (0.4 mg total) under the tongue every 5 (five) minutes as needed for chest pain. 05/31/21 07/02/22  Belva Crome, MD  omeprazole (PRILOSEC OTC) 20 MG tablet Take 20 mg by mouth daily as needed (indigestion, heartburn).    [provider]  potassium chloride SA (KLOR-CON M) 20 MEQ tablet Take 1 tablet (20 mEq total) by mouth 2 (two) times daily. 06/16/22   Heilingoetter, Cassandra L, PA-C  prochlorperazine (COMPAZINE) 10 MG tablet Take 1 tablet (10 mg total) by mouth every 6 (six) hours as needed. 05/31/22   Heilingoetter, Tobe Sos, PA-C     Critical care time: n/a    Freda Jackson, MD [facility] Office: [phone]   See Amion for personal pager PCCM on call pager [phone] until 7pm. Please call [place] 7p-7a. [phone]

## 2022-06-20 NOTE — Progress Notes (Addendum)
A consult was received from an ED physician for vancomycin per pharmacy dosing.  The patient's profile has been reviewed for ht/wt/allergies/indication/available labs.    Patient already received vancomycin 1 g IV x 1 per provider.  Will enter a one time order for vancomycin 750 mg IV to complete a total dose of 1750 mg.  Further antibiotics/pharmacy consults should be ordered by admitting physician if indicated.                       Thank you, Suzzanne Cloud, PharmD, BCPS 06/16/2022  1:10 PM

## 2022-06-20 NOTE — Sepsis Progress Note (Signed)
Elink monitoring for the code sepsis protocol.  

## 2022-06-20 NOTE — Telephone Encounter (Signed)
  Chief Complaint: abd pain. Rectal bleeding, hemorrhoids, whelp on bottom size of a golf ball.    Getting chemo for lung and liver cancer. Symptoms: Diarrhea along with the above Frequency: Since before the weekend Pertinent Negatives: Patient denies The Imodium helping the diarrhea.   Has not contacted his oncologist.  Disposition: [x] ED /[] Urgent Care (no appt availability in office) / [] Appointment(In office/virtual)/ []  [place] [facility]/ [] Home Care/ [] Refused Recommended Disposition /[] [facility]/ []  Follow-up with PCP Additional Notes: Instructed to go to the ED which he was agreeable to doing.   Calling 911 since he doesn't have anyone to take him.

## 2022-06-20 NOTE — ED Notes (Signed)
Oral care performed.

## 2022-06-20 NOTE — ED Notes (Signed)
Called lab to add M+ to pts existing blood work.

## 2022-06-20 NOTE — ED Notes (Addendum)
Pt in bed, pt skin is yellow, pt has distended abd, states that he has requests to have fluid taken off, but has never had a tap before, pt has newly placed Port in R chest, states that it has been used twice previously and he would like it to be use, port accessed with 20 gauge power huber needle, positive blood return, flush without pain.  Blood samples obtained and sent to the lab.  Pt on 2L O2 via [place] secondary to low O2 sat

## 2022-06-20 NOTE — ED Provider Notes (Signed)
[place] Provider Note   CSN: [id number] Arrival date & time: 06/14/2022  0900     History  Chief Complaint  Patient presents with   Abdominal Pain    Matthew [name] is a 62 y.o. male.  HPI    62 year old male comes in with chief complaint of lower quadrant abdominal pain radiating to his back.  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  Patient had his last chemotherapy session 3 weeks ago and is due for more chemotherapy tomorrow.  He states that over the last few days he has noted worsening suprapubic and lower abdominal pain.  He is also noticing increasing weakness and malaise.  Patient also has known history of hemorrhoids, and over the last few days he is noticing increasing bloody drainage.  Review of system is positive for subjective fevers.  No nausea or vomiting.  Home Medications Prior to Admission medications   Medication Sig Start Date End Date Taking? Authorizing Provider  traZODone (DESYREL) 50 MG tablet Take 50 mg by mouth at bedtime. 06/17/22  Yes [provider]  albuterol (VENTOLIN HFA) 108 (90 Base) MCG/ACT inhaler 1 puff as needed Inhalation every 4 hrs for 30 days 06/08/22   [provider]  amLODipine (NORVASC) 10 MG tablet TAKE 1 TABLET BY MOUTH DAILY Patient taking differently: Take 10 mg by mouth at bedtime. 04/12/22   Sueanne Margarita, MD  aspirin EC 81 MG tablet Take 1 tablet (81 mg total) by mouth daily. Swallow whole. 05/31/21   Belva Crome, MD  benzonatate (TESSALON) 100 MG capsule Take 1 capsule (100 mg total) by mouth 3 (three) times daily as needed for cough. 06/09/22   Heilingoetter, Cassandra L, PA-C  clonazePAM (KLONOPIN) 0.5 MG tablet 1 tablet at bedtime as needed Orally Once a day    [provider]  diphenoxylate-atropine (LOMOTIL) 2.5-0.025 MG tablet Take 1 tablet by mouth 4 (four) times daily as needed for  diarrhea or loose stools. 06/16/22   Heilingoetter, Cassandra L, PA-C  eszopiclone (LUNESTA) 2 MG TABS tablet Take 2 mg by mouth at bedtime as needed for sleep. Take immediately before bedtime    [provider]  famotidine-calcium carbonate-magnesium hydroxide (PEPCID COMPLETE) 10-800-165 MG chewable tablet Chew 1 tablet by mouth daily as needed (indigestion, heartburn).    [provider]  furosemide (LASIX) 20 MG tablet Take 1 tablet (20 mg total) by mouth daily. 05/18/22 08/16/22  [place] Donnamarie Poag, DO  ibuprofen (ADVIL) 200 MG tablet Take 600 mg by mouth daily as needed for headache, mild pain or moderate pain.    [provider]  lidocaine-prilocaine (EMLA) cream Apply 1 Application topically as needed. 05/31/22   Heilingoetter, Cassandra L, PA-C  LORazepam (ATIVAN) 0.5 MG tablet Take 1 tablet (0.5 mg total) by mouth every 6 (six) hours as needed for anxiety. 05/18/22   [place] Donnamarie Poag, DO  metoprolol succinate (TOPROL-XL) 100 MG 24 hr tablet Take 1 tablet (100 mg total) by mouth daily. Patient taking differently: Take 100 mg by mouth at bedtime. 04/06/22   Sueanne Margarita, MD  nitroGLYCERIN (NITROSTAT) 0.4 MG SL tablet Place 1 tablet (0.4 mg total) under the tongue every 5 (five) minutes as needed for chest pain. 05/31/21 07/02/22  Belva Crome, MD  omeprazole (PRILOSEC OTC) 20 MG tablet Take 20 mg by mouth daily as needed (indigestion, heartburn).    [provider]  potassium chloride SA (KLOR-CON M) 20 MEQ tablet Take 1 tablet (20 mEq total) by mouth 2 (two) times daily. 06/16/22   Heilingoetter, Cassandra L, PA-C  prochlorperazine (COMPAZINE) 10 MG tablet Take 1 tablet (10 mg total) by mouth every 6 (six) hours as needed. 05/31/22   Heilingoetter, Cassandra L, PA-C      Allergies    Patient has no known allergies.    Review of Systems   Review of Systems  All other systems reviewed and are negative.   Physical Exam Updated Vital Signs BP (!) 114/100   Pulse  99   Temp 98.6 F (37 C) (Oral)   Resp (!) 24   SpO2 93%  Physical Exam Vitals and nursing note reviewed.  Constitutional:      Appearance: He is well-developed.  HENT:     Head: Atraumatic.  Eyes:     General: Scleral icterus present.  Cardiovascular:     Rate and Rhythm: Tachycardia present.  Pulmonary:     Effort: Pulmonary effort is normal.  Abdominal:     Tenderness: There is abdominal tenderness in the suprapubic area. There is no guarding or rebound.  Musculoskeletal:     Cervical back: Neck supple.  Skin:    General: Skin is warm.     Coloration: Skin is jaundiced.  Neurological:     Mental Status: He is alert and oriented to person, place, and time.     ED Results / Procedures / Treatments   Labs (all labs ordered are listed, but only abnormal results are displayed) Labs Reviewed  COMPREHENSIVE METABOLIC PANEL - Abnormal; Notable for the following components:      Result Value   Sodium 131 (*)    Potassium 2.7 (*)    Chloride 90 (*)    Glucose, Bld 105 (*)    Creatinine, Ser 0.37 (*)    Calcium 6.7 (*)    Total Protein 4.4 (*)    Albumin 1.7 (*)    AST 85 (*)    ALT 136 (*)    Alkaline Phosphatase 555 (*)    Total Bilirubin 26.8 (*)    All other components within normal limits  CBC WITH DIFFERENTIAL/PLATELET - Abnormal; Notable for the following components:   WBC 31.0 (*)    RBC 2.59 (*)    Hemoglobin 7.6 (*)    HCT 23.4 (*)    RDW 23.1 (*)    nRBC 6.5 (*)    Neutro Abs 26.4 (*)    Abs Immature Granulocytes 2.20 (*)    All other components within normal limits  PROTIME-INR - Abnormal; Notable for the following components:   Prothrombin Time 44.1 (*)    INR 4.7 (*)    All other components within normal limits  APTT - Abnormal; Notable for the following components:   aPTT 99 (*)    All other components within normal limits  BLOOD GAS, VENOUS - Abnormal; Notable for the following components:   pH, Ven 7.49 (*)    Bicarbonate 35.8 (*)     Acid-Base Excess 11.3 (*)    All other components within normal limits  CULTURE, BLOOD (ROUTINE X 2)  CULTURE, BLOOD (ROUTINE X 2)  LIPASE, BLOOD  MAGNESIUM  LACTIC ACID, PLASMA  URINALYSIS, ROUTINE W REFLEX MICROSCOPIC  LACTIC ACID, PLASMA  TYPE AND SCREEN    EKG EKG Interpretation  Date/Time:  Monday June 20 2022 09:10:03 EDT Ventricular Rate:  98 PR Interval:  116 QRS Duration: 90  QT Interval:  383 QTC Calculation: 489 R Axis:   72 Text Interpretation: Sinus rhythm Borderline short PR interval Probable left atrial enlargement Borderline T abnormalities, anterior leads Borderline prolonged QT interval No significant change since last tracing Confirmed by Varney Biles [phone]) on 06/12/2022 12:44:22 PM  Radiology CT ABDOMEN PELVIS W CONTRAST  Result Date: 06/02/2022 CLINICAL DATA:  Left gluteal abscess, perirectal abscess * Tracking Code: BO * EXAM: CT ABDOMEN AND PELVIS WITH CONTRAST TECHNIQUE: Multidetector CT imaging of the abdomen and pelvis was performed using the standard protocol following bolus administration of intravenous contrast. RADIATION DOSE REDUCTION: This exam was performed according to the departmental dose-optimization program which includes automated exposure control, adjustment of the mA and/or kV according to patient size and/or use of iterative reconstruction technique. CONTRAST:  135mL OMNIPAQUE IOHEXOL 300 MG/ML  SOLN COMPARISON:  CT abdomen pelvis, 05/16/2022 MR abdomen, 05/18/2022 FINDINGS: Lower chest: No acute abnormality. Coronary artery calcifications. Partially imaged right lower lobe mass (series 2, image 3). Hepatobiliary: Hepatomegaly, maximum coronal span 22.7 cm. No significant change in innumerable hypodense metastatic lesions throughout the liver. No gallstones, gallbladder wall thickening, or biliary dilatation. Pancreas: Unremarkable. No pancreatic ductal dilatation or surrounding inflammatory changes. Spleen: Normal in size. Unchanged hypodense  splenic metastasis (series 2, image 23). Adrenals/Urinary Tract: Adrenal glands are unremarkable. Kidneys are normal, without renal calculi, solid lesion, or hydronephrosis. Distended urinary bladder. Stomach/Bowel: Stomach is within normal limits. Appendix appears normal. Descending and sigmoid diverticulosis. Pericolonic mass or fluid collection of the mid sigmoid colon adjacent to the bladder dome measuring proximally 3.7 x 2.7 cm (series 2, image 60). Vascular/Lymphatic: Aortic atherosclerosis. No enlarged abdominal or pelvic lymph nodes. Reproductive: No mass or other significant abnormality. Other: No abdominal wall hernia. Anasarca. Small volume perihepatic ascites. Extensive subcutaneous emphysema extending from the medial left gluteal subcutaneous fat about the left aspect of the low rectum and anus, into the left aspect of the pelvis, pelvic sidewall, and inner table of the left lower quadrant abdominal wall (series 2, image 73, 79, 85, 91). Musculoskeletal: No acute osseous findings. Lytic lesion of the superior endplate of L3 (series 7, image 77). IMPRESSION: 1. Extensive soft tissue emphysema extending from the medial left gluteal subcutaneous fat about the left aspect of the low rectum and anus, into the left aspect of the pelvis, pelvic sidewall, and inner table of the left lower quadrant abdominal wall. Findings are consistent with gas-forming infection, possibly originating from the rectum. 2. Pericolonic mass or fluid collection of the mid sigmoid colon measuring 3.7 x 2.7 cm. This may reflect an abscess or phlegmon, or alternately a metastatic lesion, but is new compared to CT dated 05/16/2022. This is of uncertain relation to gas-forming process described above. 3. No significant change in innumerable hypodense metastatic lesions throughout the liver. 4. Unchanged hypodense splenic metastasis. 5. Unchanged osseous lytic lesion of the superior endplate of L3. 6. Partially imaged right lower lobe  mass. 7. Small volume perihepatic ascites and anasarca. 8. Coronary artery disease. These results were called by telephone at the time of interpretation on 06/06/2022 at 2:38 pm to Dr. Varney Biles , who verbally acknowledged these results. Aortic Atherosclerosis (ICD10-I70.0). Electronically Signed   By: Delanna Ahmadi M.D.   On: 06/11/2022 14:45   DG Chest Port 1 View  Result Date: 06/20/2022 CLINICAL DATA:  Questionable sepsis. EXAM: PORTABLE CHEST 1 VIEW COMPARISON:  May 16, 2022 FINDINGS: Injectable port terminates at the expected location of the cavoatrial junction. The heart size and  mediastinal contours are within normal limits. Both lungs are clear. The visualized skeletal structures are unremarkable. IMPRESSION: No active disease. Electronically Signed   By: Fidela Salisbury M.D.   On: 06/06/2022 09:42    Procedures .Critical Care  Performed by: Varney Biles, MD Authorized by: Varney Biles, MD   Critical care provider statement:    Critical care time (minutes):  115   Critical care was necessary to treat or prevent imminent or life-threatening deterioration of the following conditions:  Sepsis and hepatic failure   Critical care was time spent personally by me on the following activities:  Development of treatment plan with patient or surrogate, discussions with consultants, evaluation of patient's response to treatment, examination of patient, ordering and review of laboratory studies, ordering and review of radiographic studies, ordering and performing treatments and interventions, pulse oximetry, re-evaluation of patient's condition and review of old charts     Medications Ordered in ED Medications  potassium chloride 10 mEq in 100 mL IVPB (10 mEq Intravenous New Bag/Given 06/06/2022 1537)  sodium chloride (PF) 0.9 % injection (has no administration in time range)  sodium chloride 0.9 % bolus 500 mL (0 mLs Intravenous Stopped 06/04/2022 1236)  vancomycin (VANCOREADY) IVPB  750 mg/150 mL (0 mg Intravenous Stopped 06/23/2022 1527)  iohexol (OMNIPAQUE) 300 MG/ML solution 100 mL (100 mLs Intravenous Contrast Given 06/11/2022 1349)  clindamycin (CLEOCIN) IVPB 900 mg (0 mg Intravenous Stopped 06/01/2022 1558)  piperacillin-tazobactam (ZOSYN) IVPB 3.375 g (3.375 g Intravenous New Bag/Given 06/01/2022 1541)  fentaNYL (SUBLIMAZE) injection 25 mcg (25 mcg Intravenous Given 06/08/2022 1609)    ED Course/ Medical Decision Making/ A&P Clinical Course as of 06/20/22 1612  Mon Jun 20, 2022  1242 I went to reassess the patient.  Specifically wanted to examine his hemorrhoid given that he has been complaining of increasing pain in his hemoglobin is 7.6. Although patient has evidence of hemorrhoid, he certainly appears to be having gluteal abscess, possibly having perirectal abscess.  Patient is having foul-smelling discharge, that is red upon palpation of the gluteal region.  Patient has a white count of 31.  4 days ago it was 28.  Prior to that, he had normal white count.  We will activate code sepsis at this time with suspicion for source of infection being soft tissue.  CT has been ordered. [AN]  1243 INR(!!): 4.7 INR today is 4.7.  His INR was 1 last month.  Could be because of progression of his cancer or because of underlying infection/sepsis. [AN]  1243 Potassium(!!): 2.7 IV potassium ordered. [AN]  1244 Total Bilirubin(!!): 26.8 Patient's bilirubin has risen from 17-26.8 over the course of last month.  His albumin has come down essentially to 1.7 from 3.4.  I will consult palliative care. [AN]  75 CT ABDOMEN PELVIS W CONTRAST CT scan concerning for deep space necrotizing infection.  Patient had received IV vancomycin along with the IV potassium.  He only has 1 access.  Nursing staff had still not given ceftriaxone given the compatibility issue.  I have advised that we will need to give him clindamycin followed by Zosyn.  Ceftriaxone will be canceled. [AN]  1545 I had consulted  general surgery for the necrotizing infection.  They have seen the patient, and have requested that we also consult ICU.  Patient has a very high risk for mortality.  I have shared the results of the CT scan with the patient.  His CODE STATUS was full code.  He has agreed to [facility].  I have made him aware that surgery team will need to make determination if patient will benefit from surgery. [AN]  1610 Both ICU service and trauma service have seen the patient.  I was made aware that patient will be going hospice route given that general surgery will not be able to offer surgical intervention of the patient given significant comorbidity/disease burden and high complication risk. [AN]    Clinical Course User Index [AN] Varney Biles, MD                             Medical Decision Making Amount and/or Complexity of Data Reviewed Labs: ordered. Decision-making details documented in ED Course. Radiology: ordered. Decision-making details documented in ED Course. ECG/medicine tests: ordered.  Risk Prescription drug management. Decision regarding hospitalization.   This patient presents to the ED with chief complaint(s) of lower quadrant abdominal pain, hemorrhoidal bleeding that has worsened with pertinent past medical history of recent diagnosis of lung cancer with mets to the liver and spleen.and also noted to be hypoxic.  He indicates that he has worsening abdominal distention. the complaint involves an extensive differential diagnosis and also carries with it a high risk of complications and morbidity.    The differential diagnosis includes : Worsening liver and splenic mass leading to cholestasis, abdominal distention secondary to ascites leading to hypoxemia, worsening anemia, severe electrolyte abnormality, thrombosed hemorrhoid, symptomatic anemia.  The initial plan is to get basic labs, reassess the patient.  It appears that patient will likely need admission to the hospital based on  initial assessment.  Appears quite jaundiced.   Additional history obtained: Additional history obtained from Records reviewed  recent discharge summary, oncology visits with chemo regimen and also reviewed patient's labs including labs from 4 days ago.  All his LFTs were elevated at that time and he had profound anemia.  Independent labs interpretation:  The following labs were independently interpreted:  See above in the workup tab  Independent visualization and interpretation of imaging: - I independently visualized the following imaging with scope of interpretation limited to determining acute life threatening conditions related to emergency care: CT abdomen pelvis, which revealed gas in the pelvic region, gluteal region.  Treatment and Reassessment: Patient reassessed.  Sepsis workup initiated.  CT abdomen pelvis ordered.  Consultation: - Consulted or discussed management/test interpretation with external professional: General surgery and ICU service consulted  I had consulted palliative care when patient first arrived.  I have not heard back from palliative service yet.  At this time we will proceed with admission request to the hospitalist service.  Patient will need transition to hospice.  TOC consult can be placed by admitting service.  Final Clinical Impression(s) / ED Diagnoses Final diagnoses:  Acute hypokalemia  Symptomatic anemia  Primary malignant neoplasm of lung metastatic to other site, unspecified laterality ([place])  Protein malnutrition [facility])    Rx / DC Orders ED Discharge Orders     None         Varney Biles, MD 06/22/2022 [phone]

## 2022-06-20 NOTE — H&P (Signed)
History and Physical    [name] [id number] DOB: 08-Oct-1960 DOA: 06/24/2022  PCP: [name], MD Patient coming from: home  Chief Complaint: Jaundice  HPI: Matthew [name] is a 62 y.o. male with medical history significant of metastatic lung cancer with mets to the spleen and liver diagnosed in February 2024.  Status post recent chemo admitted with abdominal pain which started about a week ago.  This is associated with bloody diarrhea.  No nausea or vomiting .  Past medical history significant for COPD hypertension renal artery stenosis. CT abdomen-Extensive soft tissue emphysema extending from the medial left gluteal subcutaneous fat about the left aspect of the low rectum and anus, into the left aspect of the pelvis, pelvic sidewall, and inner table of the left lower quadrant abdominal wall. Findings are consistent with gas-forming infection, possibly originating from the rectum. Pericolonic mass or fluid collection of the mid sigmoid colon measuring 3.7 x 2.7 cm. This may reflect an abscess or phlegmon, or alternately a metastatic lesion, but is new compared to CT dated 05/16/2022. This is of uncertain relation to gas-forming process described above. No significant change in innumerable hypodense metastatic lesions throughout the liver. Unchanged hypodense splenic metastasis. Unchanged osseous lytic lesion of the superior endplate of L3.Partially imaged right lower lobe mass. Small volume perihepatic ascites and anasarca. His white count is 31,000, lactic acid 1.7 potassium 2.7 sodium 131 creatinine 0.37 total bilirubin 26.8, AST 85 ALT 136 alk phos is 555, hemoglobin 7.6, INR 4.7 ED physician discussed with PCCM and general surgery-since patient needed surgery for necrotizing fasciitis but with elevated INR more than 4 due to liver failure it was deemed the prognosis was extremely poor proceeding with surgery or not.  Patient and mother agree for comfort hospice  care.  Past Medical History:  Diagnosis Date   Acute bronchitis 03/30/2017   Acute maxillary sinusitis 07/25/2017   Acute otitis media, right 05/05/2016   Acute respiratory failure with hypoxia ([place]) 03/30/2017   CAP (community acquired pneumonia) 03/31/2017   COPD (chronic obstructive pulmonary disease) ([place])    Diarrhea 10/16/2017   Diverticulitis    Ear build-up, left 08/10/2017   Hypertension    Left otitis media 07/17/2017   Pharyngitis 05/05/2016   Renal artery stenosis (HCC)    1 to 59% bilateral by Dopplers 12/2020   Sigmoid diverticulitis 02/11/2018    Past Surgical History:  Procedure Laterality Date   FINE NEEDLE ASPIRATION  05/17/2022   Procedure: FINE NEEDLE ASPIRATION (FNA) LINEAR;  Surgeon: Garner Nash, DO;  Location: [facility];  Service: Cardiopulmonary;;   [place]   IR IMAGING GUIDED PORT INSERTION  06/09/2022   VIDEO BRONCHOSCOPY WITH ENDOBRONCHIAL ULTRASOUND N/A 05/17/2022   Procedure: VIDEO BRONCHOSCOPY WITH ENDOBRONCHIAL ULTRASOUND;  Surgeon: Garner Nash, DO;  Location: [facility];  Service: Cardiopulmonary;  Laterality: N/A;    Social History   Socioeconomic History   Marital status: Widowed    Spouse name: Not on file   Number of children: 1   Years of education: Not on file   Highest education level: Not on file  Occupational History   Not on file  Tobacco Use   Smoking status: Former    Packs/day: 1    Types: Cigarettes    Quit date: 03/30/2017    Years since quitting: 5.2   Smokeless tobacco: Never  Vaping Use   Vaping Use: Never used  Substance and Sexual Activity  Alcohol use: Not on file    Comment: 7 cans of beer a week.   Drug use: Not Currently    Frequency: 2.0 times per week    Types: Marijuana   Sexual activity: Yes    Birth control/protection: None  Other Topics Concern   Not on file  Social History Narrative   Not on file   Social Determinants of Health   Financial Resource  Strain: Not on file  Food Insecurity: No Food Insecurity (05/16/2022)   Hunger Vital Sign    Worried About Running Out of Food in the Last Year: Never true    Ran Out of Food in the Last Year: Never true  Transportation Needs: No Transportation Needs (05/16/2022)   PRAPARE - Hydrologist (Medical): No    Lack of Transportation (Non-Medical): No  Physical Activity: Not on file  Stress: Not on file  Social Connections: Not on file  Intimate Partner Violence: Not At Risk (05/16/2022)   Humiliation, Afraid, Rape, and Kick questionnaire    Fear of Current or Ex-Partner: No    Emotionally Abused: No    Physically Abused: No    Sexually Abused: No    No Known Allergies  Family History  Problem Relation Age of Onset   Lymphoma Father    Cancer Mother        breast   Colon cancer Neg Hx    Rectal cancer Neg Hx    Stomach cancer Neg Hx       Prior to Admission medications   Medication Sig Start Date End Date Taking? Authorizing Provider  traZODone (DESYREL) 50 MG tablet Take 50 mg by mouth at bedtime. 06/17/22  Yes [provider]  albuterol (VENTOLIN HFA) 108 (90 Base) MCG/ACT inhaler 1 puff as needed Inhalation every 4 hrs for 30 days 06/08/22   [provider]  amLODipine (NORVASC) 10 MG tablet TAKE 1 TABLET BY MOUTH DAILY Patient taking differently: Take 10 mg by mouth at bedtime. 04/12/22   Sueanne Margarita, MD  aspirin EC 81 MG tablet Take 1 tablet (81 mg total) by mouth daily. Swallow whole. 05/31/21   Belva Crome, MD  benzonatate (TESSALON) 100 MG capsule Take 1 capsule (100 mg total) by mouth 3 (three) times daily as needed for cough. 06/09/22   Heilingoetter, Cassandra L, PA-C  clonazePAM (KLONOPIN) 0.5 MG tablet 1 tablet at bedtime as needed Orally Once a day    [provider]  diphenoxylate-atropine (LOMOTIL) 2.5-0.025 MG tablet Take 1 tablet by mouth 4 (four) times daily as needed for diarrhea or loose stools. 06/16/22    Heilingoetter, Cassandra L, PA-C  eszopiclone (LUNESTA) 2 MG TABS tablet Take 2 mg by mouth at bedtime as needed for sleep. Take immediately before bedtime    [provider]  famotidine-calcium carbonate-magnesium hydroxide (PEPCID COMPLETE) 10-800-165 MG chewable tablet Chew 1 tablet by mouth daily as needed (indigestion, heartburn).    [provider]  furosemide (LASIX) 20 MG tablet Take 1 tablet (20 mg total) by mouth daily. 05/18/22 08/16/22  [place] Donnamarie Poag, DO  ibuprofen (ADVIL) 200 MG tablet Take 600 mg by mouth daily as needed for headache, mild pain or moderate pain.    [provider]  lidocaine-prilocaine (EMLA) cream Apply 1 Application topically as needed. 05/31/22   Heilingoetter, Cassandra L, PA-C  LORazepam (ATIVAN) 0.5 MG tablet Take 1 tablet (0.5 mg total) by mouth every 6 (six) hours as needed for anxiety.  05/18/22   [place] Donnamarie Poag, DO  metoprolol succinate (TOPROL-XL) 100 MG 24 hr tablet Take 1 tablet (100 mg total) by mouth daily. Patient taking differently: Take 100 mg by mouth at bedtime. 04/06/22   Sueanne Margarita, MD  nitroGLYCERIN (NITROSTAT) 0.4 MG SL tablet Place 1 tablet (0.4 mg total) under the tongue every 5 (five) minutes as needed for chest pain. 05/31/21 07/02/22  Belva Crome, MD  omeprazole (PRILOSEC OTC) 20 MG tablet Take 20 mg by mouth daily as needed (indigestion, heartburn).    [provider]  potassium chloride SA (KLOR-CON M) 20 MEQ tablet Take 1 tablet (20 mEq total) by mouth 2 (two) times daily. 06/16/22   Heilingoetter, Cassandra L, PA-C  prochlorperazine (COMPAZINE) 10 MG tablet Take 1 tablet (10 mg total) by mouth every 6 (six) hours as needed. 05/31/22   Heilingoetter, Tobe Sos, PA-C    Physical Exam: Vitals:   05/31/2022 1500 06/22/2022 1530 06/02/2022 1600 06/04/2022 1634  BP: 107/60 112/63 (!) 114/100   Pulse: 97 100 99   Resp: (!) 23 (!) 26 (!) 24   Temp:    98.7 F (37.1 C)  TempSrc:    Oral  SpO2: 92% 92% 93%       General: Appears in distress chronically ill-appearing Eyes: Jaundice ENT: Jaundice scleral icterus Cardiovascular: RRR, no m/r/g. No LE edema.  Respiratory:  CTA bilaterally, no w/r/r. Normal respiratory effort. Abdomen:  soft, ntnd, NABS Skin: Erythema in the buttock region, jaundiced skin Neurologic: Awake and alert Labs on Admission: I have personally reviewed following labs and imaging studies  CBC: Recent Labs  Lab 06/16/22 1044 06/13/2022 0954  WBC 28.0* 31.0*  NEUTROABS 17.3* 26.4*  HGB 6.5* 7.6*  HCT 19.6* 23.4*  MCV 92.9 90.3  PLT 102* 0000000   Basic Metabolic Panel: Recent Labs  Lab 06/16/22 1044 06/23/2022 0954  NA 135 131*  K 2.7* 2.7*  CL 93* 90*  CO2 35* 31  GLUCOSE 120* 105*  BUN 16 15  CREATININE 0.84 0.37*  CALCIUM 7.0* 6.7*  MG  --  2.1   GFR: Estimated Creatinine Clearance: 97 mL/min (A) (by C-G formula based on SCr of 0.37 mg/dL (L)). Liver Function Tests: Recent Labs  Lab 06/16/22 1044 06/15/2022 0954  AST 106* 85*  ALT 175* 136*  ALKPHOS 838* 555*  BILITOT 26.2* 26.8*  PROT 4.1* 4.4*  ALBUMIN 2.7* 1.7*   Recent Labs  Lab 06/22/2022 0954  LIPASE 21   No results for input(s): "AMMONIA" in the last 168 hours. Coagulation Profile: Recent Labs  Lab 06/01/2022 0954  INR 4.7*   Cardiac Enzymes: No results for input(s): "CKTOTAL", "CKMB", "CKMBINDEX", "TROPONINI" in the last 168 hours. BNP (last 3 results) No results for input(s): "PROBNP" in the last 8760 hours. HbA1C: No results for input(s): "HGBA1C" in the last 72 hours. CBG: No results for input(s): "GLUCAP" in the last 168 hours. Lipid Profile: No results for input(s): "CHOL", "HDL", "LDLCALC", "TRIG", "CHOLHDL", "LDLDIRECT" in the last 72 hours. Thyroid Function Tests: No results for input(s): "TSH", "T4TOTAL", "FREET4", "T3FREE", "THYROIDAB" in the last 72 hours. Anemia Panel: No results for input(s): "VITAMINB12", "FOLATE", "FERRITIN", "TIBC", "IRON", "RETICCTPCT" in the  last 72 hours. Urine analysis:    Component Value Date/Time   COLORURINE AMBER (A) 06/17/2022 1542   APPEARANCEUR CLEAR 05/29/2022 1542   LABSPEC >1.046 (H) 06/15/2022 1542   PHURINE 6.0 06/13/2022 1542   GLUCOSEU NEGATIVE 06/06/2022 1542   HGBUR SMALL (A) 06/14/2022 1542  BILIRUBINUR MODERATE (A) 06/12/2022 1542   KETONESUR NEGATIVE 06/07/2022 1542   PROTEINUR 30 (A) 06/06/2022 1542   NITRITE NEGATIVE 06/01/2022 1542   LEUKOCYTESUR NEGATIVE 05/29/2022 1542    Creatinine Clearance: Estimated Creatinine Clearance: 97 mL/min (A) (by C-G formula based on SCr of 0.37 mg/dL (L)).  Sepsis Labs: @LABRCNTIP (procalcitonin:4,lacticidven:4) )No results found for this or any previous visit (from the past 240 hour(s)).   Radiological Exams on Admission: CT ABDOMEN PELVIS W CONTRAST  Result Date: 06/14/2022 CLINICAL DATA:  Left gluteal abscess, perirectal abscess * Tracking Code: BO * EXAM: CT ABDOMEN AND PELVIS WITH CONTRAST TECHNIQUE: Multidetector CT imaging of the abdomen and pelvis was performed using the standard protocol following bolus administration of intravenous contrast. RADIATION DOSE REDUCTION: This exam was performed according to the departmental dose-optimization program which includes automated exposure control, adjustment of the mA and/or kV according to patient size and/or use of iterative reconstruction technique. CONTRAST:  164mL OMNIPAQUE IOHEXOL 300 MG/ML  SOLN COMPARISON:  CT abdomen pelvis, 05/16/2022 MR abdomen, 05/18/2022 FINDINGS: Lower chest: No acute abnormality. Coronary artery calcifications. Partially imaged right lower lobe mass (series 2, image 3). Hepatobiliary: Hepatomegaly, maximum coronal span 22.7 cm. No significant change in innumerable hypodense metastatic lesions throughout the liver. No gallstones, gallbladder wall thickening, or biliary dilatation. Pancreas: Unremarkable. No pancreatic ductal dilatation or surrounding inflammatory changes. Spleen: Normal in  size. Unchanged hypodense splenic metastasis (series 2, image 23). Adrenals/Urinary Tract: Adrenal glands are unremarkable. Kidneys are normal, without renal calculi, solid lesion, or hydronephrosis. Distended urinary bladder. Stomach/Bowel: Stomach is within normal limits. Appendix appears normal. Descending and sigmoid diverticulosis. Pericolonic mass or fluid collection of the mid sigmoid colon adjacent to the bladder dome measuring proximally 3.7 x 2.7 cm (series 2, image 60). Vascular/Lymphatic: Aortic atherosclerosis. No enlarged abdominal or pelvic lymph nodes. Reproductive: No mass or other significant abnormality. Other: No abdominal wall hernia. Anasarca. Small volume perihepatic ascites. Extensive subcutaneous emphysema extending from the medial left gluteal subcutaneous fat about the left aspect of the low rectum and anus, into the left aspect of the pelvis, pelvic sidewall, and inner table of the left lower quadrant abdominal wall (series 2, image 73, 79, 85, 91). Musculoskeletal: No acute osseous findings. Lytic lesion of the superior endplate of L3 (series 7, image 77). IMPRESSION: 1. Extensive soft tissue emphysema extending from the medial left gluteal subcutaneous fat about the left aspect of the low rectum and anus, into the left aspect of the pelvis, pelvic sidewall, and inner table of the left lower quadrant abdominal wall. Findings are consistent with gas-forming infection, possibly originating from the rectum. 2. Pericolonic mass or fluid collection of the mid sigmoid colon measuring 3.7 x 2.7 cm. This may reflect an abscess or phlegmon, or alternately a metastatic lesion, but is new compared to CT dated 05/16/2022. This is of uncertain relation to gas-forming process described above. 3. No significant change in innumerable hypodense metastatic lesions throughout the liver. 4. Unchanged hypodense splenic metastasis. 5. Unchanged osseous lytic lesion of the superior endplate of L3. 6. Partially  imaged right lower lobe mass. 7. Small volume perihepatic ascites and anasarca. 8. Coronary artery disease. These results were called by telephone at the time of interpretation on 06/19/2022 at 2:38 pm to Dr. Varney Biles , who verbally acknowledged these results. Aortic Atherosclerosis (ICD10-I70.0). Electronically Signed   By: Delanna Ahmadi M.D.   On: 06/16/2022 14:45   DG Chest Port 1 View  Result Date: 06/23/2022 CLINICAL DATA:  Questionable sepsis. EXAM:  PORTABLE CHEST 1 VIEW COMPARISON:  May 16, 2022 FINDINGS: Injectable port terminates at the expected location of the cavoatrial junction. The heart size and mediastinal contours are within normal limits. Both lungs are clear. The visualized skeletal structures are unremarkable. IMPRESSION: No active disease. Electronically Signed   By: Fidela Salisbury M.D.   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)   Ascites, malignant   Malignant neoplasm of unspecified part of unspecified bronchus or lung (HCC)   Port-A-Cath in place   Sepsis ([place])   Necrotizing fasciitis ([place])   Coagulopathy ([place])   Hypokalemia    #1 metastatic lung cancer with mets to the spleen and liver admitted with necrotizing fasciitis of the pelvis with contained sigmoid perforation and sepsis  #2 sepsis patient met criteria for sepsis with hypotension tachypnea, tachycardia leukocytosis  endorgan damage such as liver failure  #3 coagulopathy  #4 hypokalemia  #5 liver failure with elevated bilirubin and INR  Plan-CODE STATUS discussed with patient and his mom.  His prognosis is extremely guarded with the above assessment.  He is mated to comfort care with palliative care consult, DNR DNI.Marland Kitchen   Estimated body mass index is 30.99 kg/m as calculated from the following:   Height as of  06/09/22: 5\' 5"  (1.651 m).   Weight as of 06/09/22: 84.5 kg.   DVT prophylaxis: None Code Status: DNR/DNI Family Communication: Discussed with mother at the bedside Disposition Plan: Hospice Consults called: PCCM, general surgery, palliative care Admission status: Inpatient   Georgette Shell MD [facility]  If 7PM-7AM, please contact night-coverage [email or web address] Password [facility]  05/28/2022, 5:39 PM

## 2022-06-20 NOTE — ED Notes (Signed)
ED TO INPATIENT HANDOFF REPORT  ED Nurse Name and Phone #: Salvatore Decent Name/Age/Gender Matthew [name] 62 y.o. male Room/Bed: WA15/WA15  Code Status   Code Status: Prior  Home/SNF/Other Home Patient oriented to: self, place, time, and situation Is this baseline? Yes   Triage Complete: Triage complete  Chief Complaint Malignancy [facility]) [C80.1]  Triage Note Pt arrived via EMS, from home. C/o lower abd pain, worse on the left but radiating all the way across. States feels like a "golf ball lump" in his stomach. States had some loose stools, saw some blood in stool but has hemorrhoids. 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) 03/30/2017   CAP (community acquired pneumonia) 03/31/2017   COPD (chronic obstructive pulmonary disease) ([place])    Diarrhea 10/16/2017   Diverticulitis    Ear build-up, left 08/10/2017   Hypertension    Left otitis media 07/17/2017   Pharyngitis 05/05/2016   Renal artery stenosis ([place])    1 to 59% bilateral by Dopplers 12/2020   Sigmoid  diverticulitis 02/11/2018   Past Surgical History:  Procedure Laterality Date   FINE NEEDLE ASPIRATION  05/17/2022   Procedure: FINE NEEDLE ASPIRATION (FNA) LINEAR;  Surgeon: Garner Nash, DO;  Location: [place];  Service: Cardiopulmonary;;   [facility]   IR IMAGING GUIDED PORT INSERTION  06/09/2022   VIDEO BRONCHOSCOPY WITH ENDOBRONCHIAL ULTRASOUND N/A 05/17/2022   Procedure: VIDEO BRONCHOSCOPY WITH ENDOBRONCHIAL ULTRASOUND;  Surgeon: Garner Nash, DO;  Location: [place];  Service: Cardiopulmonary;  Laterality: N/A;     A IV Location/Drains/Wounds Patient Lines/Drains/Airways Status     Active Line/Drains/Airways     Name Placement date Placement time Site Days   Implanted Port 06/09/22 Right Chest 06/09/22  1356  Chest  11   Peripheral IV 06/23/2022 20 G Anterior;Right Forearm 06/05/2022  1535  Forearm  less than 1            Intake/Output Last 24 hours  Intake/Output Summary (Last 24 hours) at 06/22/2022 1720 Last data filed at 06/07/2022 1644 Gross per 24 hour  Intake 648.6 ml  Output --  Net 648.6 ml    Labs/Imaging Results for orders placed or performed during the hospital encounter of 05/28/2022 (from the past 48 hour(s))  Comprehensive metabolic panel     Status: Abnormal   Collection Time: 05/27/2022  9:54 AM  Result Value Ref Range   Sodium 131 (L) 135 - 145 mmol/L   Potassium 2.7 (LL) 3.5 - 5.1 mmol/L  Comment: CRITICAL RESULT CALLED TO, READ BACK BY AND VERIFIED WITH DOWD,P. RN AT 1050 06/03/2022 MULLINS,T    Chloride 90 (L) 98 - 111 mmol/L   CO2 31 22 - 32 mmol/L   Glucose, Bld 105 (H) 70 - 99 mg/dL    Comment: Glucose reference range applies only to samples taken after fasting for at least 8 hours.   BUN 15 8 - 23 mg/dL   Creatinine, Ser 0.37 (L) 0.61 - 1.24 mg/dL    Comment: ICTERUS AT THIS LEVEL MAY AFFECT RESULT   Calcium 6.7 (L) 8.9 - 10.3 mg/dL   Total Protein 4.4 (L) 6.5 - 8.1 g/dL   Albumin 1.7 (L) 3.5 -  5.0 g/dL   AST 85 (H) 15 - 41 U/L   ALT 136 (H) 0 - 44 U/L   Alkaline Phosphatase 555 (H) 38 - 126 U/L   Total Bilirubin 26.8 (HH) 0.3 - 1.2 mg/dL    Comment: CRITICAL RESULT CALLED TO, READ BACK BY AND VERIFIED WITH DOWD,P. RN AT 1050 06/05/2022 MULLINS,T    GFR, Estimated >60 >60 mL/min    Comment: (NOTE) Calculated using the CKD-EPI Creatinine Equation (2021)    Anion gap 10 5 - 15    Comment: Performed at [facility], [place] [address], [place], [place] [address]  CBC with Differential     Status: Abnormal   Collection Time: 06/16/2022  9:54 AM  Result Value Ref Range   WBC 31.0 (H) 4.0 - 10.5 K/uL   RBC 2.59 (L) 4.22 - 5.81 MIL/uL   Hemoglobin 7.6 (L) 13.0 - 17.0 g/dL   HCT 23.4 (L) 39.0 - 52.0 %   MCV 90.3 80.0 - 100.0 fL   MCH 29.3 26.0 - 34.0 pg   MCHC 32.5 30.0 - 36.0 g/dL   RDW 23.1 (H) 11.5 - 15.5 %   Platelets 195 150 - 400 K/uL   nRBC 6.5 (H) 0.0 - 0.2 %   Neutrophils Relative % 55 %   Neutro Abs 26.4 (H) 1.7 - 7.7 K/uL   Band Neutrophils 30 %   Lymphocytes Relative 6 %   Lymphs Abs 1.9 0.7 - 4.0 K/uL   Monocytes Relative 2 %   Monocytes Absolute 0.6 0.1 - 1.0 K/uL   Eosinophils Relative 0 %   Eosinophils Absolute 0.0 0.0 - 0.5 K/uL   Basophils Relative 0 %   Basophils Absolute 0.0 0.0 - 0.1 K/uL   WBC Morphology INCREASED BANDS (>20% BANDS)     Comment: MILD LEFT SHIFT (1-5% METAS, OCC MYELO, OCC BANDS) TOXIC GRANULATION    Metamyelocytes Relative 2 %   Myelocytes 5 %   Abs Immature Granulocytes 2.20 (H) 0.00 - 0.07 K/uL   Polychromasia PRESENT    Target Cells PRESENT     Comment: Performed at [facility], [place] [address] [place], [place] [address]  Protime-INR     Status: Abnormal   Collection Time: 06/16/2022  9:54 AM  Result Value Ref Range   Prothrombin Time 44.1 (H) 11.4 - 15.2 seconds   INR 4.7 (HH) 0.8 - 1.2    Comment: CRITICAL RESULT CALLED TO, READ BACK BY AND VERIFIED WITH: GRANT, K. RN @1106  ON 3.25.2024 BY  NMCCOY (NOTE) INR goal varies based on device and disease states. Performed at [facility], [place] [address], [place],  [address]   APTT     Status: Abnormal   Collection Time: 06/24/2022  9:54 AM  Result Value Ref Range  aPTT 99 (H) 24 - 36 seconds    Comment:        IF BASELINE aPTT IS ELEVATED, SUGGEST PATIENT RISK ASSESSMENT BE USED TO DETERMINE APPROPRIATE ANTICOAGULANT THERAPY. Performed at [facility], [place] [address], [place], [place] [address]   Blood gas, venous ([facility], [facility], [facility])     Status: Abnormal   Collection Time: 06/08/2022  9:54 AM  Result Value Ref Range   pH, Ven 7.49 (H) 7.25 - 7.43   pCO2, Ven 47 44 - 60 mmHg   pO2, Ven 40 32 - 45 mmHg   Bicarbonate 35.8 (H) 20.0 - 28.0 mmol/L   Acid-Base Excess 11.3 (H) 0.0 - 2.0 mmol/L   O2 Saturation 67.2 %   Patient temperature 37.0     Comment: Performed at [facility], [place] [address] [place], [place] [address]  Lipase, blood     Status: None   Collection Time: 06/16/2022  9:54 AM  Result Value Ref Range   Lipase 21 11 - 51 U/L    Comment: Performed at [facility], [place] [address] [place], [place] [address]  Magnesium     Status: None   Collection Time: 06/12/2022  9:54 AM  Result Value Ref Range   Magnesium 2.1 1.7 - 2.4 mg/dL    Comment: Performed at [facility], [place] [address], [place], [place] [address]  Lactic acid, plasma     Status: None   Collection Time: 06/19/2022 12:49 PM  Result Value Ref Range   Lactic Acid, Venous 1.7 0.5 - 1.9 mmol/L    Comment: ICTERUS AT THIS LEVEL MAY AFFECT RESULT Performed at [place] [address], [place], [place] [address]   Urinalysis, Routine w reflex microscopic -Urine, Clean Catch     Status: Abnormal   Collection Time: 06/15/2022  3:42 PM  Result Value Ref Range   Color, Urine AMBER (A) YELLOW    Comment: BIOCHEMICALS MAY BE AFFECTED BY COLOR    APPearance CLEAR CLEAR   Specific Gravity, Urine >1.046 (H) 1.005 - 1.030   pH 6.0 5.0 - 8.0   Glucose, UA NEGATIVE NEGATIVE mg/dL   Hgb urine dipstick SMALL (A) NEGATIVE   Bilirubin Urine MODERATE (A) NEGATIVE   Ketones, ur NEGATIVE NEGATIVE mg/dL   Protein, ur 30 (A) NEGATIVE mg/dL   Nitrite NEGATIVE NEGATIVE   Leukocytes,Ua NEGATIVE NEGATIVE   RBC / HPF 6-10 0 - 5 RBC/hpf   WBC, UA 0-5 0 - 5 WBC/hpf   Bacteria, UA RARE (A) NONE SEEN   Squamous Epithelial / HPF 0-5 0 - 5 /HPF    Comment: Performed at [facility], [place] [address] [place], [place] [address]   CT ABDOMEN PELVIS W CONTRAST  Result Date: 06/19/2022 CLINICAL DATA:  Left gluteal abscess, perirectal abscess * Tracking Code: BO * EXAM: CT ABDOMEN AND PELVIS WITH CONTRAST TECHNIQUE: Multidetector CT imaging of the abdomen and pelvis was performed using the standard protocol following bolus administration of intravenous contrast. RADIATION DOSE REDUCTION: This exam was performed according to the departmental dose-optimization program which includes automated exposure control, adjustment of the mA and/or kV according to patient size and/or use of iterative reconstruction technique. CONTRAST:  142mL OMNIPAQUE IOHEXOL 300 MG/ML  SOLN COMPARISON:  CT abdomen pelvis, 05/16/2022 MR abdomen, 05/18/2022 FINDINGS: Lower chest: No acute abnormality. Coronary artery calcifications. Partially imaged right lower lobe mass (series 2, image 3). Hepatobiliary: Hepatomegaly, maximum coronal span 22.7 cm. No  significant change in innumerable hypodense metastatic lesions throughout the liver. No gallstones, gallbladder wall thickening, or biliary dilatation. Pancreas: Unremarkable. No pancreatic ductal dilatation or surrounding inflammatory changes. Spleen: Normal in size. Unchanged hypodense splenic metastasis (series 2, image 23). Adrenals/Urinary Tract: Adrenal glands are unremarkable. Kidneys are normal, without renal calculi, solid  lesion, or hydronephrosis. Distended urinary bladder. Stomach/Bowel: Stomach is within normal limits. Appendix appears normal. Descending and sigmoid diverticulosis. Pericolonic mass or fluid collection of the mid sigmoid colon adjacent to the bladder dome measuring proximally 3.7 x 2.7 cm (series 2, image 60). Vascular/Lymphatic: Aortic atherosclerosis. No enlarged abdominal or pelvic lymph nodes. Reproductive: No mass or other significant abnormality. Other: No abdominal wall hernia. Anasarca. Small volume perihepatic ascites. Extensive subcutaneous emphysema extending from the medial left gluteal subcutaneous fat about the left aspect of the low rectum and anus, into the left aspect of the pelvis, pelvic sidewall, and inner table of the left lower quadrant abdominal wall (series 2, image 73, 79, 85, 91). Musculoskeletal: No acute osseous findings. Lytic lesion of the superior endplate of L3 (series 7, image 77). IMPRESSION: 1. Extensive soft tissue emphysema extending from the medial left gluteal subcutaneous fat about the left aspect of the low rectum and anus, into the left aspect of the pelvis, pelvic sidewall, and inner table of the left lower quadrant abdominal wall. Findings are consistent with gas-forming infection, possibly originating from the rectum. 2. Pericolonic mass or fluid collection of the mid sigmoid colon measuring 3.7 x 2.7 cm. This may reflect an abscess or phlegmon, or alternately a metastatic lesion, but is new compared to CT dated 05/16/2022. This is of uncertain relation to gas-forming process described above. 3. No significant change in innumerable hypodense metastatic lesions throughout the liver. 4. Unchanged hypodense splenic metastasis. 5. Unchanged osseous lytic lesion of the superior endplate of L3. 6. Partially imaged right lower lobe mass. 7. Small volume perihepatic ascites and anasarca. 8. Coronary artery disease. These results were called by telephone at the time of  interpretation on 05/31/2022 at 2:38 pm to Dr. Varney Biles , who verbally acknowledged these results. Aortic Atherosclerosis (ICD10-I70.0). Electronically Signed   By: Delanna Ahmadi M.D.   On: 06/18/2022 14:45   DG Chest Port 1 View  Result Date: 06/01/2022 CLINICAL DATA:  Questionable sepsis. EXAM: PORTABLE CHEST 1 VIEW COMPARISON:  May 16, 2022 FINDINGS: Injectable port terminates at the expected location of the cavoatrial junction. The heart size and mediastinal contours are within normal limits. Both lungs are clear. The visualized skeletal structures are unremarkable. IMPRESSION: No active disease. Electronically Signed   By: Fidela Salisbury M.D.   On: 06/25/2022 09:42    Pending Labs Unresulted Labs (From admission, onward)     Start     Ordered   06/03/2022 1527  Type and screen  Once,   STAT        06/14/2022 1526   05/28/2022 1231  Lactic acid, plasma  (Septic presentation on arrival (screening labs, nursing and treatment orders for obvious sepsis))  STAT Now then every 2 hours,   R      06/10/2022 1233   06/19/2022 1231  Blood Culture (routine x 2)  (Septic presentation on arrival (screening labs, nursing and treatment orders for obvious sepsis))  BLOOD CULTURE X 2,   STAT      06/13/2022 1233            Vitals/Pain Today's Vitals   06/18/2022 1530 06/20/22 1600 06/20/22 1616 06/20/22 1634  BP: 112/63 (!) 114/100    Pulse: 100 99    Resp: (!) 26 (!) 24    Temp:    98.7 F (37.1 C)  TempSrc:    Oral  SpO2: 92% 93%    PainSc:   7      Isolation Precautions No active isolations  Medications Medications  potassium chloride 10 mEq in 100 mL IVPB (10 mEq Intravenous New Bag/Given 06/15/2022 1713)  sodium chloride (PF) 0.9 % injection (has no administration in time range)  sodium chloride 0.9 % bolus 500 mL (0 mLs Intravenous Stopped 06/23/2022 1236)  vancomycin (VANCOREADY) IVPB 750 mg/150 mL (0 mg Intravenous Stopped 06/05/2022 1527)  iohexol (OMNIPAQUE) 300 MG/ML solution 100  mL (100 mLs Intravenous Contrast Given 06/10/2022 1349)  clindamycin (CLEOCIN) IVPB 900 mg (0 mg Intravenous Stopped 05/27/2022 1558)  piperacillin-tazobactam (ZOSYN) IVPB 3.375 g (0 g Intravenous Stopped 06/09/2022 1616)  fentaNYL (SUBLIMAZE) injection 25 mcg (25 mcg Intravenous Given 06/20/22 1609)    Mobility walks        R Recommendations: See Admitting Provider Note  Report given to:   Additional Notes:

## 2022-06-20 NOTE — Progress Notes (Signed)
A consult was received from an ED physician for zosyn per pharmacy dosing.  The patient's profile has been reviewed for ht/wt/allergies/indication/available labs.    A one time order has been placed for zosyn 3.375 gm iv once.    Further antibiotics/pharmacy consults should be ordered by admitting physician if indicated.                       Thank you, Heywood Footman 06/04/2022  3:26 PM

## 2022-06-21 ENCOUNTER — Inpatient Hospital Stay: Payer: Commercial Managed Care - HMO

## 2022-06-21 ENCOUNTER — Other Ambulatory Visit: Payer: Commercial Managed Care - HMO

## 2022-06-21 ENCOUNTER — Inpatient Hospital Stay: Payer: Commercial Managed Care - HMO | Admitting: Internal Medicine

## 2022-06-21 DIAGNOSIS — C3490 Malignant neoplasm of unspecified part of unspecified bronchus or lung: Secondary | ICD-10-CM | POA: Diagnosis not present

## 2022-06-21 DIAGNOSIS — M726 Necrotizing fasciitis: Secondary | ICD-10-CM | POA: Diagnosis not present

## 2022-06-21 DIAGNOSIS — Z66 Do not resuscitate: Secondary | ICD-10-CM

## 2022-06-21 DIAGNOSIS — Z515 Encounter for palliative care: Secondary | ICD-10-CM | POA: Diagnosis not present

## 2022-06-21 DIAGNOSIS — G893 Neoplasm related pain (acute) (chronic): Secondary | ICD-10-CM

## 2022-06-21 DIAGNOSIS — C801 Malignant (primary) neoplasm, unspecified: Secondary | ICD-10-CM | POA: Diagnosis not present

## 2022-06-21 DIAGNOSIS — Z79899 Other long term (current) drug therapy: Secondary | ICD-10-CM

## 2022-06-21 DIAGNOSIS — C787 Secondary malignant neoplasm of liver and intrahepatic bile duct: Secondary | ICD-10-CM

## 2022-06-21 DIAGNOSIS — C7951 Secondary malignant neoplasm of bone: Secondary | ICD-10-CM

## 2022-06-21 DIAGNOSIS — K831 Obstruction of bile duct: Secondary | ICD-10-CM

## 2022-06-21 DIAGNOSIS — Z7189 Other specified counseling: Secondary | ICD-10-CM

## 2022-06-21 LAB — BLOOD CULTURE ID PANEL (REFLEXED) - BCID2

## 2022-06-21 LAB — COMPREHENSIVE METABOLIC PANEL
ALT: 400 U/L — ABNORMAL HIGH (ref 0–44)
AST: 514 U/L — ABNORMAL HIGH (ref 15–41)
Albumin: 1.7 g/dL — ABNORMAL LOW (ref 3.5–5.0)
Alkaline Phosphatase: 537 U/L — ABNORMAL HIGH (ref 38–126)
Anion gap: 16 — ABNORMAL HIGH (ref 5–15)
BUN: 24 mg/dL — ABNORMAL HIGH (ref 8–23)
CO2: 25 mmol/L (ref 22–32)
Calcium: 7.1 mg/dL — ABNORMAL LOW (ref 8.9–10.3)
Chloride: 92 mmol/L — ABNORMAL LOW (ref 98–111)
Creatinine, Ser: 1.23 mg/dL (ref 0.61–1.24)
GFR, Estimated: >60 mL/min (ref >60)
Glucose, Bld: 43 mg/dL — CL (ref 70–99)
Potassium: 3.9 mmol/L (ref 3.5–5.1)
Sodium: 133 mmol/L — ABNORMAL LOW (ref 135–145)
Total Bilirubin: 29.9 mg/dL (ref 0.3–1.2)
Total Protein: 4.7 g/dL — ABNORMAL LOW (ref 6.5–8.1)

## 2022-06-21 LAB — CBC
HCT: 25 % — ABNORMAL LOW (ref 39.0–52.0)
Hemoglobin: 7.9 g/dL — ABNORMAL LOW (ref 13.0–17.0)
MCH: 29.9 pg (ref 26.0–34.0)
MCHC: 31.6 g/dL (ref 30.0–36.0)
MCV: 94.7 fL (ref 80.0–100.0)
Platelets: 232 10*3/uL (ref 150–400)
RBC: 2.64 MIL/uL — ABNORMAL LOW (ref 4.22–5.81)
RDW: 23.9 % — ABNORMAL HIGH (ref 11.5–15.5)
WBC: 30.3 10*3/uL — ABNORMAL HIGH (ref 4.0–10.5)
nRBC: 9.5 % — ABNORMAL HIGH (ref 0.0–0.2)

## 2022-06-21 LAB — GLUCOSE, CAPILLARY: Glucose-Capillary: 102 mg/dL — ABNORMAL HIGH (ref 70–99)

## 2022-06-21 MED ORDER — SODIUM CHLORIDE 0.9 % IV BOLUS
250.0000 mL | Freq: Once | INTRAVENOUS | Status: AC
  Administered 2022-06-21: 250 mL via INTRAVENOUS

## 2022-06-21 MED ORDER — HYDROMORPHONE BOLUS VIA INFUSION: 1.0000 mg | INTRAVENOUS | Status: DC | PRN

## 2022-06-21 MED ORDER — DEXTROSE-NACL 5-0.9 % IV SOLN: INTRAVENOUS | Status: DC

## 2022-06-21 MED ORDER — DEXTROSE 50 % IV SOLN: 25.0000 g | INTRAVENOUS | Status: AC

## 2022-06-21 MED ORDER — CHLORHEXIDINE GLUCONATE CLOTH 2 % EX PADS
6.0000 | MEDICATED_PAD | Freq: Every day | CUTANEOUS | Status: DC
  Administered 2022-06-21: 6 via TOPICAL

## 2022-06-21 MED ORDER — DEXTROSE 50 % IV SOLN
INTRAVENOUS | Status: AC
  Administered 2022-06-21: 25 g via INTRAVENOUS
  Filled 2022-06-21: qty 50

## 2022-06-21 MED ORDER — HYDROMORPHONE HCL-NACL 50-0.9 MG/50ML-% IV SOLN
0.5000 mg/h | INTRAVENOUS | Status: DC
  Administered 2022-06-21: 0.2 mg/h via INTRAVENOUS
  Filled 2022-06-21 (×2): qty 50

## 2022-06-21 MED ORDER — DEXTROSE 50 % IV SOLN
1.0000 | Freq: Once | INTRAVENOUS | Status: AC
  Administered 2022-06-21: 50 mL via INTRAVENOUS
  Filled 2022-06-21: qty 50

## 2022-06-21 MED ORDER — ORAL CARE MOUTH RINSE: 15.0000 mL | OROMUCOSAL | Status: DC | PRN

## 2022-06-22 ENCOUNTER — Ambulatory Visit: Payer: Commercial Managed Care - HMO | Admitting: Physician Assistant

## 2022-06-22 ENCOUNTER — Inpatient Hospital Stay: Payer: Commercial Managed Care - HMO

## 2022-06-23 ENCOUNTER — Ambulatory Visit: Payer: Commercial Managed Care - HMO

## 2022-06-23 ENCOUNTER — Inpatient Hospital Stay: Payer: Commercial Managed Care - HMO

## 2022-06-24 ENCOUNTER — Ambulatory Visit: Payer: Commercial Managed Care - HMO

## 2022-06-24 LAB — CULTURE, BLOOD (ROUTINE X 2)
Special Requests: ADEQUATE
Special Requests: ADEQUATE

## 2022-06-25 ENCOUNTER — Inpatient Hospital Stay: Payer: Commercial Managed Care - HMO

## 2022-06-27 ENCOUNTER — Ambulatory Visit: Payer: Commercial Managed Care - HMO

## 2022-06-27 NOTE — Progress Notes (Signed)
Hypoglycemic Event  CBG: 43 Glucose lab draw  Treatment: D50 50 mL (25 gm)  Symptoms: Sweaty  Follow-up CBG: Time:413 CBG Result:102  Possible Reasons for Event: Inadequate meal intake  Comments/MD notified: Clarene Essex NP  D5/NS IVF added prior to recheck @ [address], [place]

## 2022-06-27 NOTE — Progress Notes (Signed)
    OVERNIGHT PROGRESS REPORT  Notified by RN that patient is deceased as of 07/01/2025 Hrs.  Patient was DNR/CMO (comfort care) 2 RN verified.  Family was immediately available/present with RN .     Gershon Cull MSNA ACNPC-AG Acute Care Nurse Practitioner [place]

## 2022-06-27 NOTE — Progress Notes (Signed)
PHARMACY - PHYSICIAN COMMUNICATION CRITICAL VALUE ALERT - BLOOD CULTURE IDENTIFICATION (BCID)  Matthew [name] is an 62 y.o. male who presented to [facility] on 06/04/2022 with jaundice.  Assessment: BCID + E. Coli (no resistance) in 4/4 bottles.  Patient with necrotizing fasciitis. 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 Decision for comfort hospice care made.  Name of physician (or Provider) Contacted: Gershon Cull, FNP  Current antibiotics: none  Changes to prescribed antibiotics recommended:  No antibiotics at this time as currently not treating necrotizing fasciitis infection.    Results for orders placed or performed during the hospital encounter of 06/23/2022  Blood Culture ID Panel (Reflexed) (Collected: 06/03/2022 12:56 PM)  Result Value Ref Range   Enterococcus faecalis NOT DETECTED NOT DETECTED   Enterococcus Faecium NOT DETECTED NOT DETECTED   Listeria monocytogenes NOT DETECTED NOT DETECTED   Staphylococcus species NOT DETECTED NOT DETECTED   Staphylococcus aureus (BCID) NOT DETECTED NOT DETECTED   Staphylococcus epidermidis NOT DETECTED NOT DETECTED   Staphylococcus lugdunensis NOT DETECTED NOT DETECTED   Streptococcus species NOT DETECTED NOT DETECTED   Streptococcus agalactiae NOT DETECTED NOT DETECTED   Streptococcus pneumoniae NOT DETECTED NOT DETECTED   Streptococcus pyogenes NOT DETECTED NOT DETECTED   A.calcoaceticus-baumannii NOT DETECTED NOT DETECTED   Bacteroides fragilis NOT DETECTED NOT DETECTED   Enterobacterales DETECTED (A) NOT DETECTED   Enterobacter cloacae complex NOT DETECTED NOT DETECTED   Escherichia coli DETECTED (A) NOT DETECTED   Klebsiella aerogenes NOT DETECTED NOT DETECTED   Klebsiella oxytoca NOT DETECTED NOT DETECTED   Klebsiella pneumoniae NOT DETECTED NOT DETECTED   Proteus species NOT DETECTED NOT DETECTED   Salmonella species NOT DETECTED NOT DETECTED   Serratia marcescens NOT DETECTED NOT DETECTED    Haemophilus influenzae NOT DETECTED NOT DETECTED   Neisseria meningitidis NOT DETECTED NOT DETECTED   Pseudomonas aeruginosa NOT DETECTED NOT DETECTED   Stenotrophomonas maltophilia NOT DETECTED NOT DETECTED   Candida albicans NOT DETECTED NOT DETECTED   Candida auris NOT DETECTED NOT DETECTED   Candida glabrata NOT DETECTED NOT DETECTED   Candida krusei NOT DETECTED NOT DETECTED   Candida parapsilosis NOT DETECTED NOT DETECTED   Candida tropicalis NOT DETECTED NOT DETECTED   Cryptococcus neoformans/gattii NOT DETECTED NOT DETECTED   CTX-M ESBL NOT DETECTED NOT DETECTED   Carbapenem resistance IMP NOT DETECTED NOT DETECTED   Carbapenem resistance KPC NOT DETECTED NOT DETECTED   Carbapenem resistance NDM NOT DETECTED NOT DETECTED   Carbapenem resist OXA 48 LIKE NOT DETECTED NOT DETECTED   Carbapenem resistance VIM NOT DETECTED NOT DETECTED    Everette Rank, PharmD 11-Jul-2022  3:53 AM

## 2022-06-27 NOTE — Progress Notes (Addendum)
Update daughter on events of last night which were the following:  Patient given bath after waking up clammy and sweaty.  He had leaked from his rectum with stool and blood.  No comfortable way to place a dressing on his lesion at this time.  Paper pads obtained from the Emergency department to act as a large abdominal pad.  The bed pans are leaking and at risk of keeping moisture close to his skin for breakdown.  Lab glucose 43 critical called to night NP.  IVF fluids NS @ 20 ml/hr to D5/NS @ 30 ml/hr.  And diet changed from NPO to clear liquids  (advanced as tolerated).    Patient only voided 125 ml and strained to go earlier in the shift,.  Bladder scan showed 494 and 501 ml.  Foley inserted for Acute Urinary Retention.  Originally came out tea colored but then blood in his urine NP made aware.  With over 600 ml out after foley insertion. Hgb 7.9 this morning.  Small bolus NS 250 ml for low BP 77/49(58) and discontinued cardiac monitoring per NP since he is comfort care.

## 2022-06-27 NOTE — Progress Notes (Signed)
Alerted by family member that patient not breathing in room. Auscultated heart and breaths sounds for one minute, pt found to be in asystole and without respirations at 2027.  Second nurse Renita RN called to bedside to assess as second RN and death confirmed.

## 2022-06-27 NOTE — Progress Notes (Signed)
PROGRESS NOTE    Matthew [name]  [id number] DOB: 05/03/1960 DOA: 06/20/2022 PCP: [name], MD   Brief Narrative: Matthew [name] is a 62 y.o. male with medical history significant of metastatic lung cancer with mets to the spleen and liver diagnosed in February 2024.  Status post recent chemo admitted with abdominal pain which started about a week ago.  This is associated with bloody diarrhea.  No nausea or vomiting .  Past medical history significant for COPD hypertension renal artery stenosis. CT abdomen-Extensive soft tissue emphysema extending from the medial left gluteal subcutaneous fat about the left aspect of the low rectum and anus, into the left aspect of the pelvis, pelvic sidewall, and inner table of the left lower quadrant abdominal wall. Findings are consistent with gas-forming infection, possibly originating from the rectum. Pericolonic mass or fluid collection of the mid sigmoid colon measuring 3.7 x 2.7 cm. This may reflect an abscess or phlegmon, or alternately a metastatic lesion, but is new compared to CT dated 05/16/2022. This is of uncertain relation to gas-forming process described above. No significant change in innumerable hypodense metastatic lesions throughout the liver. Unchanged hypodense splenic metastasis. Unchanged osseous lytic lesion of the superior endplate of L3.Partially imaged right lower lobe mass. Small volume perihepatic ascites and anasarca. His white count is 31,000, lactic acid 1.7 potassium 2.7 sodium 131 creatinine 0.37 total bilirubin 26.8, AST 85 ALT 136 alk phos is 555, hemoglobin 7.6, INR 4.7 ED physician discussed with PCCM and general surgery-since patient needed surgery for necrotizing fasciitis but with elevated INR more than 4 due to liver failure it was deemed the prognosis was extremely poor proceeding with surgery or not.  Patient and mother agree for comfort hospice care.    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)   Ascites, malignant   Malignant neoplasm of unspecified part of unspecified bronchus or lung (HCC)   Port-A-Cath in place   Sepsis ([place])   Necrotizing fasciitis ([place])   Coagulopathy ([place])   Hypokalemia   DNR (do not resuscitate)   Cancer associated pain   Counseling and coordination of care   End of life care   Goals of care, counseling/discussion   Primary malignant neoplasm of lung metastatic to other site [facility])   Palliative care encounter   High risk medication use   #1 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  Plan per palliative care.  Continue Dilaudid drip.  Continue IV Ativan.  Continue Robinul.   #2 sepsis patient met criteria for sepsis with hypotension tachypnea, tachycardia leukocytosis  endorgan damage such as liver failure   #3 coagulopathy   #4 hypokalemia   #5 liver failure with elevated bilirubin and INR    Estimated body mass index is 30.34 kg/m as calculated from the following:   Height as of this encounter: 5\' 5"  (1.651 m).   Weight as of this encounter: 82.7 kg.  DVT prophylaxis: none Code Status: full Family Communication:dw mom Disposition Plan:  Status is: Inpatient Remains inpatient appropriate because: actively dying    Consultants:  Pccm Palliative surgery  Procedures: none Antimicrobials:none  Subjective: Unresponsive jaundiced on dilaudid gtt  Objective: Vitals:  06/23/2022 1825 06/03/2022 1859 06/16/2022 2249 2022-07-16 0617  BP: 107/62  (!) 87/51 (!) 77/49  Pulse: 99  96 89  Resp: (!) 23  18 16   Temp: 98.2 F (36.8 C)  98.3 F (36.8 C) 98.7 F (37.1 C)  TempSrc: Oral  Oral Oral  SpO2: 92%  90% 91%  Weight:  82.7 kg     Height:  5\' 5"  (1.651 m)      Intake/Output Summary (Last 24 hours) at 07/16/2022 1557 Last data filed at 2022-07-16 [id number] Gross per 24 hour  Intake 643.29 ml  Output 725 ml  Net -81.71 ml   Filed Weights   06/06/2022 1859  Weight: 82.7 kg   Examination:  General exam: unresponsive   Respiratory system: rhonchi to auscultation. Respiratory effort normal. Cardiovascular system: S1 & S2 heard, RRR. No JVD, murmurs, rubs, gallops or clicks. No pedal edema. Gastrointestinal system: abdomen distended  Central nervous system: unresponsive Extremities:3 plus edema Skin: jaundice  Data Reviewed: I have personally reviewed following labs and imaging studies  CBC: Recent Labs  Lab 06/16/22 1044 06/02/2022 0954 07/16/2022 0240  WBC 28.0* 31.0* 30.3*  NEUTROABS 17.3* 26.4*  --   HGB 6.5* 7.6* 7.9*  HCT 19.6* 23.4* 25.0*  MCV 92.9 90.3 94.7  PLT 102* 195 [id number]   Basic Metabolic Panel: Recent Labs  Lab 06/16/22 1044 06/22/2022 0954 07-16-22 0240  NA 135 131* 133*  K 2.7* 2.7* 3.9  CL 93* 90* 92*  CO2 35* 31 25  GLUCOSE 120* 105* 43*  BUN 16 15 24*  CREATININE 0.84 0.37* 1.23  CALCIUM 7.0* 6.7* 7.1*  MG  --  2.1  --    GFR: Estimated Creatinine Clearance: 62.4 mL/min (by C-G formula based on SCr of 1.23 mg/dL). Liver Function Tests: Recent Labs  Lab 06/16/22 1044 06/19/2022 0954 07/16/22 0240  AST 106* 85* 514*  ALT 175* 136* 400*  ALKPHOS 838* 555* 537*  BILITOT 26.2* 26.8* 29.9*  PROT 4.1* 4.4* 4.7*  ALBUMIN 2.7* 1.7* 1.7*   Recent Labs  Lab 06/03/2022 0954  LIPASE 21   No results for input(s): "AMMONIA" in the last 168 hours. Coagulation Profile: Recent Labs  Lab 06/19/2022 0954  INR 4.7*   Cardiac Enzymes: No results for input(s): "CKTOTAL", "CKMB", "CKMBINDEX", "TROPONINI" in the last 168 hours. BNP (last 3 results) No results for input(s): "PROBNP" in the last 8760 hours. HbA1C: No results for input(s): "HGBA1C" in the last 72 hours. CBG: Recent Labs   Lab Jul 16, 2022 0413  GLUCAP 102*   Lipid Profile: No results for input(s): "CHOL", "HDL", "LDLCALC", "TRIG", "CHOLHDL", "LDLDIRECT" in the last 72 hours. Thyroid Function Tests: No results for input(s): "TSH", "T4TOTAL", "FREET4", "T3FREE", "THYROIDAB" in the last 72 hours. Anemia Panel: No results for input(s): "VITAMINB12", "FOLATE", "FERRITIN", "TIBC", "IRON", "RETICCTPCT" in the last 72 hours. Sepsis Labs: Recent Labs  Lab 06/19/2022 1249 06/07/2022 1705  LATICACIDVEN 1.7 2.1*    Recent Results (from the past 240 hour(s))  Blood Culture (routine x 2)     Status: None (Preliminary result)   Collection Time: 05/29/2022 12:49 PM   Specimen: BLOOD  Result Value Ref Range Status   Specimen Description   Final    BLOOD BLOOD LEFT HAND Performed at [facility], [place] [address] [place],  [address]    Special Requests   Final    BOTTLES DRAWN AEROBIC AND ANAEROBIC Blood Culture adequate volume Performed at [place] [address] [place], [place]  [address]    Culture  Setup Time   Final    GRAM NEGATIVE RODS IN BOTH AEROBIC AND ANAEROBIC BOTTLES CRITICAL VALUE NOTED.  VALUE IS CONSISTENT WITH PREVIOUSLY REPORTED AND CALLED VALUE.    Culture   Final    NO GROWTH < 24 HOURS Performed at [facility], [place] [address], [place], [place] [address]    Report Status PENDING  Incomplete  Blood Culture (routine x 2)     Status: None (Preliminary result)   Collection Time: 06/02/2022 12:56 PM   Specimen: BLOOD  Result Value Ref Range Status   Specimen Description   Final    BLOOD BLOOD RIGHT WRIST Performed at [place] [address] [place], [place] [address]    Special Requests   Final    BOTTLES DRAWN AEROBIC AND ANAEROBIC Blood Culture adequate volume Performed at [place] [address], [place], [place] [address]    Culture  Setup Time   Final    GRAM NEGATIVE RODS IN BOTH  AEROBIC AND ANAEROBIC BOTTLES CRITICAL RESULT CALLED TO, READ BACK BY AND VERIFIED WITH: PHARMD L. POINTDEXTER 06/29/22 @ 0332 BY AB Performed at [facility], [place] [address], [place], [place] [address]    Culture GRAM NEGATIVE RODS  Final   Report Status PENDING  Incomplete  Blood Culture ID Panel (Reflexed)     Status: Abnormal   Collection Time: 05/30/2022 12:56 PM  Result Value Ref Range Status   Enterococcus faecalis NOT DETECTED NOT DETECTED Final   Enterococcus Faecium NOT DETECTED NOT DETECTED Final   Listeria monocytogenes NOT DETECTED NOT DETECTED Final   Staphylococcus species NOT DETECTED NOT DETECTED Final   Staphylococcus aureus (BCID) NOT DETECTED NOT DETECTED Final   Staphylococcus epidermidis NOT DETECTED NOT DETECTED Final   Staphylococcus lugdunensis NOT DETECTED NOT DETECTED Final   Streptococcus species NOT DETECTED NOT DETECTED Final   Streptococcus agalactiae NOT DETECTED NOT DETECTED Final   Streptococcus pneumoniae NOT DETECTED NOT DETECTED Final   Streptococcus pyogenes NOT DETECTED NOT DETECTED Final   A.calcoaceticus-baumannii NOT DETECTED NOT DETECTED Final   Bacteroides fragilis NOT DETECTED NOT DETECTED Final   Enterobacterales DETECTED (A) NOT DETECTED Final    Comment: Enterobacterales represent a large order of gram negative bacteria, not a single organism. CRITICAL RESULT CALLED TO, READ BACK BY AND VERIFIED WITH: PHARMD L. POINTDEXTER June 29, 2022 @ 0332 BY AB    Enterobacter cloacae complex NOT DETECTED NOT DETECTED Final   Escherichia coli DETECTED (A) NOT DETECTED Final    Comment: CRITICAL RESULT CALLED TO, READ BACK BY AND VERIFIED WITH: PHARMD L. POINTDEXTER 06/29/22 @ 0332 BY AB    Klebsiella aerogenes NOT DETECTED NOT DETECTED Final   Klebsiella oxytoca NOT DETECTED NOT DETECTED Final   Klebsiella pneumoniae NOT DETECTED NOT DETECTED Final   Proteus species NOT DETECTED NOT DETECTED Final   Salmonella species NOT DETECTED NOT DETECTED  Final   Serratia marcescens NOT DETECTED NOT DETECTED Final   Haemophilus influenzae NOT DETECTED NOT DETECTED Final   Neisseria meningitidis NOT DETECTED NOT DETECTED Final   Pseudomonas aeruginosa NOT DETECTED NOT DETECTED Final   Stenotrophomonas maltophilia NOT DETECTED NOT DETECTED Final   Candida albicans NOT DETECTED NOT DETECTED Final   Candida auris NOT DETECTED NOT DETECTED Final   Candida glabrata NOT DETECTED NOT DETECTED Final   Candida krusei NOT DETECTED NOT DETECTED Final   Candida parapsilosis NOT DETECTED NOT DETECTED Final   Candida  tropicalis NOT DETECTED NOT DETECTED Final   Cryptococcus neoformans/gattii NOT DETECTED NOT DETECTED Final   CTX-M ESBL NOT DETECTED NOT DETECTED Final   Carbapenem resistance IMP NOT DETECTED NOT DETECTED Final   Carbapenem resistance KPC NOT DETECTED NOT DETECTED Final   Carbapenem resistance NDM NOT DETECTED NOT DETECTED Final   Carbapenem resist OXA 48 LIKE NOT DETECTED NOT DETECTED Final   Carbapenem resistance VIM NOT DETECTED NOT DETECTED Final    Comment: Performed at [facility], [place] [address], [place], [place] [address]         Radiology Studies: CT ABDOMEN PELVIS W CONTRAST  Result Date: 06/03/2022 CLINICAL DATA:  Left gluteal abscess, perirectal abscess * Tracking Code: BO * EXAM: CT ABDOMEN AND PELVIS WITH CONTRAST TECHNIQUE: Multidetector CT imaging of the abdomen and pelvis was performed using the standard protocol following bolus administration of intravenous contrast. RADIATION DOSE REDUCTION: This exam was performed according to the departmental dose-optimization program which includes automated exposure control, adjustment of the mA and/or kV according to patient size and/or use of iterative reconstruction technique. CONTRAST:  147mL OMNIPAQUE IOHEXOL 300 MG/ML  SOLN COMPARISON:  CT abdomen pelvis, 05/16/2022 MR abdomen, 05/18/2022 FINDINGS: Lower chest: No acute abnormality. Coronary artery calcifications.  Partially imaged right lower lobe mass (series 2, image 3). Hepatobiliary: Hepatomegaly, maximum coronal span 22.7 cm. No significant change in innumerable hypodense metastatic lesions throughout the liver. No gallstones, gallbladder wall thickening, or biliary dilatation. Pancreas: Unremarkable. No pancreatic ductal dilatation or surrounding inflammatory changes. Spleen: Normal in size. Unchanged hypodense splenic metastasis (series 2, image 23). Adrenals/Urinary Tract: Adrenal glands are unremarkable. Kidneys are normal, without renal calculi, solid lesion, or hydronephrosis. Distended urinary bladder. Stomach/Bowel: Stomach is within normal limits. Appendix appears normal. Descending and sigmoid diverticulosis. Pericolonic mass or fluid collection of the mid sigmoid colon adjacent to the bladder dome measuring proximally 3.7 x 2.7 cm (series 2, image 60). Vascular/Lymphatic: Aortic atherosclerosis. No enlarged abdominal or pelvic lymph nodes. Reproductive: No mass or other significant abnormality. Other: No abdominal wall hernia. Anasarca. Small volume perihepatic ascites. Extensive subcutaneous emphysema extending from the medial left gluteal subcutaneous fat about the left aspect of the low rectum and anus, into the left aspect of the pelvis, pelvic sidewall, and inner table of the left lower quadrant abdominal wall (series 2, image 73, 79, 85, 91). Musculoskeletal: No acute osseous findings. Lytic lesion of the superior endplate of L3 (series 7, image 77). IMPRESSION: 1. Extensive soft tissue emphysema extending from the medial left gluteal subcutaneous fat about the left aspect of the low rectum and anus, into the left aspect of the pelvis, pelvic sidewall, and inner table of the left lower quadrant abdominal wall. Findings are consistent with gas-forming infection, possibly originating from the rectum. 2. Pericolonic mass or fluid collection of the mid sigmoid colon measuring 3.7 x 2.7 cm. This may reflect  an abscess or phlegmon, or alternately a metastatic lesion, but is new compared to CT dated 05/16/2022. This is of uncertain relation to gas-forming process described above. 3. No significant change in innumerable hypodense metastatic lesions throughout the liver. 4. Unchanged hypodense splenic metastasis. 5. Unchanged osseous lytic lesion of the superior endplate of L3. 6. Partially imaged right lower lobe mass. 7. Small volume perihepatic ascites and anasarca. 8. Coronary artery disease. These results were called by telephone at the time of interpretation on 06/14/2022 at 2:38 pm to Dr. Varney Biles , who verbally acknowledged these results. Aortic Atherosclerosis (ICD10-I70.0). Electronically Signed  By: Delanna Ahmadi M.D.   On: 06/09/2022 14:45   DG Chest Port 1 View  Result Date: 06/01/2022 CLINICAL DATA:  Questionable sepsis. EXAM: PORTABLE CHEST 1 VIEW COMPARISON:  May 16, 2022 FINDINGS: Injectable port terminates at the expected location of the cavoatrial junction. The heart size and mediastinal contours are within normal limits. Both lungs are clear. The visualized skeletal structures are unremarkable. IMPRESSION: No active disease. Electronically Signed   By: Fidela Salisbury M.D.   On: 06/09/2022 09:42    Scheduled Meds:  Chlorhexidine Gluconate Cloth  6 each Topical Daily   Continuous Infusions:  HYDROmorphone 0.2 mg/hr (07-03-2022 1042)     LOS: 1 day   Time spent:38 min  Georgette Shell, MD  2022/07/03, 3:57 PM

## 2022-06-27 NOTE — Consult Note (Addendum)
Consultation Note Date: 07/06/22   Patient Name: Matthew [name]  DOB: 01-Sep-1960  MRN: [id number]  Age / Sex: 62 y.o., male   PCP: [name], MD Referring Physician: Georgette Shell, MD  Reason for Consultation: Establishing goals of care     Chief Complaint/History of Present Illness:   Patient is a 62 year old male with a past medical history of metastatic lung cancer diagnosed in February 2024, COPD, hypertension, and renal artery stenosis who was admitted on 06/01/2022 for management of abdominal pain associated with bloody diarrhea.  Imaging obtained upon admission showed extensive soft tissue emphysema extending from the medial left gluteal subcutaneous fat about the left aspect of the low rectum and anus into the left aspect of the pelvis, pelvic sidewall, and entered table of the left lower quadrant abdominal wall.  Findings consistent with gas-forming infection, possibly or originating from the rectum.  Findings discussed with surgery for management of necrotizing fasciitis though patient actively and multisystem organ failure and deemed not to be an appropriate surgical candidate in the setting.  As per EMR review, patient and mother agreed to focus on comfort focused care at this point.  Palliative medicine team consulted to assist with complex medical decision making.  Extensive review of EMR prior to seeing patient.  At time of EMR review, patient had received IV Dilaudid 2 mg x 3 doses.  Currently written to be given every 2 hours scheduled.  Patient also became hypoglycemic overnight and was started on IV fluids for dextrose management.  Review of vitals this morning showed blood pressure of 77/49 around 6 AM.   Presented to bedside to see patient.  Patient sleeping comfortably in bed.  Patient will awaken though very lethargic.  Patient incredibly jaundiced with pronounced scleral icterus.  Patient denied any pain at this time.  Upon examination, patient's lower  extremities cool to touch. Patient's mother who was present at bedside.  Introduced myself and the role of the palliative medicine team.  Mother updated that patient's daughter is currently on the way from [place] to see patient.  She also noted that granddaughter and great grandchild will be coming to visit today if allowed.  Noted would defer to bedside RN about visitation policy.  Spent time providing emotional support for mother.  Also noted would request continued chaplain support; chaplain had visited with patient and mother in ED. Discussed at this time based on patient's most recent blood pressure reading and hypoglycemic event, patient would remain at the hospital making sure he remains comfortable.  Should patient appears stable enough once daughter arrives later today, could consider inpatient hospice.  Would worry about transporting patient prior to daughter getting to bedside in case patient's blood pressure further is to deteriorate and patient would die before family having chance to visit. Discussed starting IV medications continuously for pain management based on patient's IV Dilaudid requirements.  Also discussed discontinuing IV fluids as do not assist in causing comfort.  Patient and mother agreeing with this.  All questions answered at that time.  Thank patient and mother for allowing me to visit with him today.  Followed up with care team regarding conversation.  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)  Ascites, malignant  Malignant neoplasm of unspecified part of unspecified bronchus or lung (HCC)  Right lower lobe lung mass  Past Medical History:  Diagnosis Date   Acute bronchitis  03/30/2017   Acute maxillary sinusitis 07/25/2017   Acute otitis media, right 05/05/2016   Acute respiratory failure with hypoxia ([place]) 03/30/2017   CAP (community acquired  pneumonia) 03/31/2017   COPD (chronic obstructive pulmonary disease) ([place])    Diarrhea 10/16/2017   Diverticulitis    Ear build-up, left 08/10/2017   Hypertension    Left otitis media 07/17/2017   Pharyngitis 05/05/2016   Renal artery stenosis (HCC)    1 to 59% bilateral by Dopplers 12/2020   Sigmoid diverticulitis 02/11/2018   Social History   Socioeconomic History   Marital status: Widowed    Spouse name: Not on file   Number of children: 1   Years of education: Not on file   Highest education level: Not on file  Occupational History   Not on file  Tobacco Use   Smoking status: Former    Packs/day: 1    Types: Cigarettes    Quit date: 03/30/2017    Years since quitting: 5.2   Smokeless tobacco: Never  Vaping Use   Vaping Use: Never used  Substance and Sexual Activity   Alcohol use: Not on file    Comment: 7 cans of beer a week.   Drug use: Not Currently    Frequency: 2.0 times per week    Types: Marijuana   Sexual activity: Yes    Birth control/protection: None  Other Topics Concern   Not on file  Social History Narrative   Not on file   Social Determinants of Health   Financial Resource Strain: Not on file  Food Insecurity: No Food Insecurity (06/05/2022)   Hunger Vital Sign    Worried About Running Out of Food in the Last Year: Never true    Ran Out of Food in the Last Year: Never true  Transportation Needs: No Transportation Needs (06/04/2022)   PRAPARE - Hydrologist (Medical): No    Lack of Transportation (Non-Medical): No  Physical Activity: Not on file  Stress: Not on file  Social Connections: Not on file   Family History  Problem Relation Age of Onset   Lymphoma Father    Cancer Mother        breast   Colon cancer Neg Hx    Rectal cancer Neg Hx    Stomach cancer Neg Hx    Scheduled Meds:  Chlorhexidine Gluconate Cloth  6 each Topical Daily    HYDROmorphone (DILAUDID) injection  2 mg Intravenous Q2H   Continuous  Infusions:  dextrose 5 % and 0.9% NaCl 30 mL/hr at June 22, 2022 0624   PRN Meds:.acetaminophen **OR** acetaminophen, glycopyrrolate **OR** glycopyrrolate **OR** glycopyrrolate, HYDROmorphone (DILAUDID) injection, LORazepam, mouth rinse, polyvinyl alcohol No Known Allergies CBC:    Component Value Date/Time   WBC 30.3 (H) 06-22-22 0240   HGB 7.9 (L) 06/22/22 0240   HGB 6.5 (LL) 06/16/2022 1044   HGB 17.4 07/16/2019 0858   HCT 25.0 (L) Jun 22, 2022 0240   HCT 51.7 (H) 07/16/2019 0858   PLT 232 06-22-22 0240   PLT 102 (L) 06/16/2022 1044   PLT 254 07/16/2019 0858   MCV 94.7 June 22, 2022 0240   MCV 97 07/16/2019 0858   NEUTROABS 26.4 (H) 06/06/2022 0954   NEUTROABS 4.3 07/16/2019 0858   LYMPHSABS 1.9 06/19/2022 0954   LYMPHSABS 1.1 07/16/2019 0858   MONOABS 0.6 06/12/2022 0954   EOSABS 0.0 05/28/2022 0954   EOSABS 0.2 07/16/2019 0858   BASOSABS 0.0 06/20/2022 0954   BASOSABS 0.1 07/16/2019 [id number]  Comprehensive Metabolic Panel:    Component Value Date/Time   NA 133 (L) Jul 01, 2022 0240   NA 134 05/31/2021 1633   K 3.9 01-Jul-2022 0240   CL 92 (L) 07/01/2022 0240   CO2 25 Jul 01, 2022 0240   BUN 24 (H) 2022-07-01 0240   BUN 14 05/31/2021 1633   CREATININE 1.23 01-Jul-2022 0240   CREATININE 0.84 06/16/2022 1044   GLUCOSE 43 (LL) 07-01-22 0240   CALCIUM 7.1 (L) 07/01/2022 0240   AST 514 (H) 07-01-2022 0240   AST 106 (H) 06/16/2022 1044   ALT 400 (H) 07-01-2022 0240   ALT 175 (H) 06/16/2022 1044   ALKPHOS 537 (H) 07-01-22 0240   BILITOT 29.9 (HH) 2022-07-01 0240   BILITOT 26.2 (HH) 06/16/2022 1044   PROT 4.7 (L) July 01, 2022 0240   PROT 6.8 12/15/2020 0937   ALBUMIN 1.7 (L) 2022/07/01 0240   ALBUMIN 4.6 12/15/2020 [id number]    Physical Exam: Vital Signs: BP (!) 77/49 (BP Location: Right Arm)   Pulse 89   Temp 98.7 F (37.1 C) (Oral)   Resp 16   Ht 5\' 5"  (1.651 m)   Wt 82.7 kg   SpO2 91%   BMI 30.34 kg/m  SpO2: SpO2: 91 % O2 Device: O2 Device: Nasal Cannula O2 Flow  Rate: O2 Flow Rate (L/min): 3 L/min Intake/output summary:  Intake/Output Summary (Last 24 hours) at 01-Jul-2022 [id number] Last data filed at 07/01/22 [id number] Gross per 24 hour  Intake 1099.25 ml  Output 725 ml  Net 374.25 ml   LBM: Last BM Date : 06/26/2022 (PTA) Baseline Weight: Weight: 82.7 kg Most recent weight: Weight: 82.7 kg  General: Will awaken though very lethargic, incredibly jaundiced, ill-appearing Eyes: Scleral icterus prominent HENT: dry mucous membranes Cardiovascular: RRR Respiratory: no increased work of breathing noted, not in respiratory distress Abdomen: Slightly distended Skin: Lower extremities cool to touch bilaterally Neuro: Lethargic, not able to participate in extensive conversation due to medical illness Psych: calm          Palliative Performance Scale: 20%              Additional Data Reviewed: Recent Labs    06/25/2022 0954 07/01/22 0240  WBC 31.0* 30.3*  HGB 7.6* 7.9*  PLT 195 232  NA 131* 133*  BUN 15 24*  CREATININE 0.37* 1.23    Imaging: CT ABDOMEN PELVIS W CONTRAST CLINICAL DATA:  Left gluteal abscess, perirectal abscess * Tracking Code: BO *  EXAM: CT ABDOMEN AND PELVIS WITH CONTRAST  TECHNIQUE: Multidetector CT imaging of the abdomen and pelvis was performed using the standard protocol following bolus administration of intravenous contrast.  RADIATION DOSE REDUCTION: This exam was performed according to the departmental dose-optimization program which includes automated exposure control, adjustment of the mA and/or kV according to patient size and/or use of iterative reconstruction technique.  CONTRAST:  16mL OMNIPAQUE IOHEXOL 300 MG/ML  SOLN  COMPARISON:  CT abdomen pelvis, 05/16/2022 MR abdomen, 05/18/2022  FINDINGS: Lower chest: No acute abnormality. Coronary artery calcifications. Partially imaged right lower lobe mass (series 2, image 3).  Hepatobiliary: Hepatomegaly, maximum coronal span 22.7 cm. No significant change  in innumerable hypodense metastatic lesions throughout the liver. No gallstones, gallbladder wall thickening, or biliary dilatation.  Pancreas: Unremarkable. No pancreatic ductal dilatation or surrounding inflammatory changes.  Spleen: Normal in size. Unchanged hypodense splenic metastasis (series 2, image 23).  Adrenals/Urinary Tract: Adrenal glands are unremarkable. Kidneys are normal, without renal calculi, solid lesion, or hydronephrosis. Distended urinary bladder.  Stomach/Bowel: Stomach  is within normal limits. Appendix appears normal. Descending and sigmoid diverticulosis. Pericolonic mass or fluid collection of the mid sigmoid colon adjacent to the bladder dome measuring proximally 3.7 x 2.7 cm (series 2, image 60).  Vascular/Lymphatic: Aortic atherosclerosis. No enlarged abdominal or pelvic lymph nodes.  Reproductive: No mass or other significant abnormality.  Other: No abdominal wall hernia. Anasarca. Small volume perihepatic ascites. Extensive subcutaneous emphysema extending from the medial left gluteal subcutaneous fat about the left aspect of the low rectum and anus, into the left aspect of the pelvis, pelvic sidewall, and inner table of the left lower quadrant abdominal wall (series 2, image 73, 79, 85, 91).  Musculoskeletal: No acute osseous findings. Lytic lesion of the superior endplate of L3 (series 7, image 77).  IMPRESSION: 1. Extensive soft tissue emphysema extending from the medial left gluteal subcutaneous fat about the left aspect of the low rectum and anus, into the left aspect of the pelvis, pelvic sidewall, and inner table of the left lower quadrant abdominal wall. Findings are consistent with gas-forming infection, possibly originating from the rectum. 2. Pericolonic mass or fluid collection of the mid sigmoid colon measuring 3.7 x 2.7 cm. This may reflect an abscess or phlegmon, or alternately a metastatic lesion, but is new compared to CT  dated 05/16/2022. This is of uncertain relation to gas-forming process described above. 3. No significant change in innumerable hypodense metastatic lesions throughout the liver. 4. Unchanged hypodense splenic metastasis. 5. Unchanged osseous lytic lesion of the superior endplate of L3. 6. Partially imaged right lower lobe mass. 7. Small volume perihepatic ascites and anasarca. 8. Coronary artery disease.  These results were called by telephone at the time of interpretation on 05/29/2022 at 2:38 pm to Dr. Varney Biles , who verbally acknowledged these results.  Aortic Atherosclerosis (ICD10-I70.0).  Electronically Signed   By: Delanna Ahmadi M.D.   On: 06/05/2022 14:45 DG Chest Port 1 View CLINICAL DATA:  Questionable sepsis.  EXAM: PORTABLE CHEST 1 VIEW  COMPARISON:  May 16, 2022  FINDINGS: Injectable port terminates at the expected location of the cavoatrial junction.  The heart size and mediastinal contours are within normal limits. Both lungs are clear. The visualized skeletal structures are unremarkable.  IMPRESSION: No active disease.  Electronically Signed   By: Fidela Salisbury M.D.   On: 06/24/2022 09:42    I personally reviewed recent imaging.   Palliative Care Assessment and Plan Summary of Established Goals of Care and Medical Treatment Preferences   Patient is a 61 year old male with a past medical history of metastatic lung cancer diagnosed in February 2024, COPD, hypertension, and renal artery stenosis who was admitted on 06/22/2022 for management of abdominal pain associated with bloody diarrhea.  Imaging obtained upon admission showed extensive soft tissue emphysema extending from the medial left gluteal subcutaneous fat about the left aspect of the low rectum and anus into the left aspect of the pelvis, pelvic sidewall, and entered table of the left lower quadrant abdominal wall.  Findings consistent with gas-forming infection, possibly or  originating from the rectum.  Findings discussed with surgery for management of necrotizing fasciitis though patient actively and multisystem organ failure and deemed not to be an appropriate surgical candidate in the setting.  As per EMR review, patient and mother agreed to focus on comfort focused care at this point.  Palliative medicine team consulted to assist with complex medical decision making.  # Complex medical decision making/goals of care  -Met with patient and his mother  at bedside today.  Patient is focused on comfort care at the end of life.  Based on current assessment, concerned about transport to inpatient hospice until all family have had a chance to visit with patient.  Should patient's vitals stabilize, would consider referral to [facility] for inpatient hospice coordination.  Further family planning on being at bedside later today, hopeful to have chance to visit with patient.  -At this time we will discontinue interventions that are no longer focused on comfort such as IV fluids, imaging, or lab work.  Will instead focus on symptom management of pain, dyspnea, and agitation in the setting of end-of-life care.  -  Code Status: DNR  Prognosis: Hours - Days in the setting of multisystem organ failure with metastatic lung cancer   # Symptom management   -Pain/Dyspnea, acute in the setting of end-of-life care Patient has been on IV Dilaudid 2 mg scheduled every 2 hours.                               -Started IV Dilaudid 0.2 mg/hr continuous infusion based off of patient's current OME requirements.  Start IV Dilaudid 1 mg bolus every 15 minutes as needed for breakthrough management from infusion.  Continue to adjust based on symptom burden.   -Discontinue other opioids ordered if not through infusion once it is initiated.                  -Anxiety/agitation, in the setting of end-of-life care                               -Continue IV Ativan 1 mg every 4 hours as needed. Continue to adjust  based on patient's symptom burden.                    -Secretions, in the setting of end-of-life care                               -Continue IV glycopyrrolate 0.2 mg every 4 hours as needed.  # Psycho-social/Spiritual Support:  - Support System: Mother, daughter, granddaughter - Desire for further Chaplain support:yes, reached out to chaplain about end-of-life support  # Discharge Planning: Patient comfort focused care only.  Anticipate in-hospital death.  Should patient stabilize, could consider evaluation for inpatient hospice if appropriate.  Thank you for allowing the palliative care team to participate in the care [name].  Chelsea Aus, DO Palliative Care Provider PMT # [phone]  If patient remains symptomatic despite maximum doses, please call PMT at [phone] between 0700 and 1900. Outside of these hours, please call attending, as PMT does not have night coverage.  *Please note that this is a verbal dictation therefore any spelling or grammatical errors are due to the "[employer]" system interpretation.   UPDATE: Family requested visit with this provider later in the day. Spoke with multiple family members at bedside including daughter (who is an Therapist, sports). Discussed continued comfort care at this time. Daughter requesting one time dose of D5 to see if patient awakens to talk with her. Explained patient's encephalopathy is more likely from his multi system organ failure. Willing to give one time dose of D5 though will NOT resume continuous IVFs or glucose checks. Priority remains patient's comfort at the end of life. When  seeing patient this afternoon, appears uncomfortable. Appropriately increasing dilaudid infusion and discussed this with family. Noted should patient be stable for possible transfer tomorrow, could consider referral to [facility]. All questions answered at that time. Provided emotional support via active listening.  Discussed with care team after visit.

## 2022-06-27 NOTE — Progress Notes (Signed)
Chaplain engaged in an initial visit with Matthew [name]. Chaplain is following up from visit with another Chaplain last night. Matthew [name] was comfortably sleeping. [name] does not have any needs at this time. She vocalized that Matthew [name] was alert last night but hasn't been since she arrived this morning at 9:00 am. She recognizes that her son was in pain before and now is not. She also voiced that Matthew [name]'s daughter should be arriving in the next hour or so.   Chaplain offered support and will follow-up before leaving today.   Matthew [name], MDiv   2022-07-15 1100  Spiritual Encounters  Type of Visit Initial  Care provided to: Family  Referral source Physician  Reason for visit End-of-life
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
## 2022-06-27 DEATH — deceased

## 2022-06-30 ENCOUNTER — Inpatient Hospital Stay: Payer: Commercial Managed Care - HMO

## 2022-07-07 ENCOUNTER — Inpatient Hospital Stay: Payer: Commercial Managed Care - HMO

## 2022-07-13 ENCOUNTER — Other Ambulatory Visit: Payer: Commercial Managed Care - HMO

## 2022-07-13 ENCOUNTER — Ambulatory Visit: Payer: Commercial Managed Care - HMO | Admitting: Internal Medicine

## 2022-07-13 ENCOUNTER — Ambulatory Visit: Payer: Commercial Managed Care - HMO

## 2022-07-14 ENCOUNTER — Ambulatory Visit: Payer: Commercial Managed Care - HMO

## 2022-07-15 ENCOUNTER — Ambulatory Visit: Payer: Commercial Managed Care - HMO

## 2022-07-18 ENCOUNTER — Ambulatory Visit: Payer: Commercial Managed Care - HMO

## 2022-07-21 ENCOUNTER — Other Ambulatory Visit: Payer: Commercial Managed Care - HMO

## 2022-07-27 NOTE — Death Summary Note (Signed)
Death Summary  Matthew [name] [id number] DOB: 01-28-1961 DOA: Jul 13, 2022  PCP: [name], MD  Admit date: 13-Jul-2022 Date of Death: 07-23-2022 Time of Death: 8 27 pm Notification: [name], MD notified of death of 23-Jul-2022   History of present illness:  Matthew [name] is a 62 y.o. male with a history of  metastatic lung cancer diagnosed in February 2024, COPD, hypertension, and renal artery stenosis who was admitted on July 13, 2022 for management of abdominal pain associated with bloody diarrhea.  Imaging obtained upon admission showed extensive soft tissue emphysema extending from the medial left gluteal subcutaneous fat about the left aspect of the low rectum and anus into the left aspect of the pelvis, pelvic sidewall, and entered table of the left lower quadrant abdominal wall.  Findings consistent with gas-forming infection, possibly or originating from the rectum.  Findings discussed with surgery for management of necrotizing fasciitis though patient actively and multisystem organ failure and deemed not to be an appropriate surgical candidate in the setting.  As per EMR review, patient and mother agreed to focus on comfort focused care at this point.  Palliative medicine team consulted to assist with complex medical decision making.    Final Diagnoses:  1.   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    Significant Diagnostic Studies: CT ABDOMEN PELVIS W CONTRAST  Result Date: 13-Jul-2022 CLINICAL DATA:  Left gluteal abscess, perirectal abscess * Tracking Code: BO * EXAM: CT ABDOMEN AND PELVIS WITH CONTRAST TECHNIQUE: Multidetector CT imaging of the abdomen and pelvis was performed using the standard protocol following  bolus administration of intravenous contrast. RADIATION DOSE REDUCTION: This exam was performed according to the departmental dose-optimization program which includes automated exposure control, adjustment of the mA and/or kV according to patient size and/or use of iterative reconstruction technique. CONTRAST:  187mL OMNIPAQUE IOHEXOL 300 MG/ML  SOLN COMPARISON:  CT abdomen pelvis, 05/16/2022 MR abdomen, 05/18/2022 FINDINGS: Lower chest: No acute abnormality. Coronary artery calcifications. Partially imaged right lower lobe mass (series 2, image 3). Hepatobiliary: Hepatomegaly, maximum coronal span 22.7 cm. No significant change in innumerable hypodense metastatic lesions throughout the liver. No gallstones, gallbladder wall thickening, or biliary dilatation. Pancreas: Unremarkable. No pancreatic ductal dilatation or surrounding inflammatory changes. Spleen: Normal in size. Unchanged hypodense splenic metastasis (series 2, image 23). Adrenals/Urinary Tract: Adrenal glands are unremarkable. Kidneys are normal, without renal calculi, solid lesion, or hydronephrosis. Distended urinary bladder. Stomach/Bowel: Stomach is within normal limits. Appendix appears normal. Descending and sigmoid diverticulosis. Pericolonic mass or fluid collection of the mid sigmoid colon adjacent to the bladder dome measuring proximally 3.7 x 2.7 cm (series 2, image 60). Vascular/Lymphatic: Aortic atherosclerosis. No enlarged abdominal or pelvic lymph nodes. Reproductive: No mass or other significant abnormality. Other: No abdominal wall hernia. Anasarca. Small volume perihepatic ascites. Extensive subcutaneous emphysema extending from the medial left gluteal subcutaneous fat about the left aspect of the low rectum and anus, into the left aspect of the pelvis, pelvic sidewall, and inner table of the left lower quadrant abdominal wall (series 2, image 73, 79, 85, 91). Musculoskeletal: No acute osseous findings. Lytic lesion of the superior  endplate of L3 (series 7, image 77). IMPRESSION: 1. Extensive soft tissue emphysema extending from the medial left gluteal subcutaneous fat about the left aspect of the  low rectum and anus, into the left aspect of the pelvis, pelvic sidewall, and inner table of the left lower quadrant abdominal wall. Findings are consistent with gas-forming infection, possibly originating from the rectum. 2. Pericolonic mass or fluid collection of the mid sigmoid colon measuring 3.7 x 2.7 cm. This may reflect an abscess or phlegmon, or alternately a metastatic lesion, but is new compared to CT dated 05/16/2022. This is of uncertain relation to gas-forming process described above. 3. No significant change in innumerable hypodense metastatic lesions throughout the liver. 4. Unchanged hypodense splenic metastasis. 5. Unchanged osseous lytic lesion of the superior endplate of L3. 6. Partially imaged right lower lobe mass. 7. Small volume perihepatic ascites and anasarca. 8. Coronary artery disease. These results were called by telephone at the time of interpretation on 06/09/2022 at 2:38 pm to Dr. Varney Biles , who verbally acknowledged these results. Aortic Atherosclerosis (ICD10-I70.0). Electronically Signed   By: Delanna Ahmadi M.D.   On: 06/18/2022 14:45   DG Chest Port 1 View  Result Date: 05/30/2022 CLINICAL DATA:  Questionable sepsis. EXAM: PORTABLE CHEST 1 VIEW COMPARISON:  May 16, 2022 FINDINGS: Injectable port terminates at the expected location of the cavoatrial junction. The heart size and mediastinal contours are within normal limits. Both lungs are clear. The visualized skeletal structures are unremarkable. IMPRESSION: No active disease. Electronically Signed   By: Fidela Salisbury M.D.   On: 06/18/2022 09:42   IR IMAGING GUIDED PORT INSERTION  Result Date: 06/09/2022 INDICATION: 62 year old male with history of lung cancer requiring central venous access for chemotherapy administration. EXAM: IMPLANTED  PORT A CATH PLACEMENT WITH ULTRASOUND AND FLUOROSCOPIC GUIDANCE COMPARISON:  None Available. MEDICATIONS: None. ANESTHESIA/SEDATION: Moderate (conscious) sedation was employed during this procedure. A total of Versed 2 mg and Fentanyl 100 mcg was administered intravenously. Moderate Sedation Time: 13 minutes. The patient's level of consciousness and vital signs were monitored continuously by radiology nursing throughout the procedure under my direct supervision. CONTRAST:  None FLUOROSCOPY TIME:  One mGy COMPLICATIONS: None immediate. PROCEDURE: The procedure, risks, benefits, and alternatives were explained to the patient. Questions regarding the procedure were encouraged and answered. The patient understands and consents to the procedure. The right neck and chest were prepped with chlorhexidine in a sterile fashion, and a sterile drape was applied covering the operative field. Maximum barrier sterile technique with sterile gowns and gloves were used for the procedure. A timeout was performed prior to the initiation of the procedure. Ultrasound was used to examine the jugular vein which was compressible and free of internal echoes. A skin marker was used to demarcate the planned venotomy and port pocket incision sites. Local anesthesia was provided to these sites and the subcutaneous tunnel track with 1% lidocaine with 1:100,000 epinephrine. A small incision was created at the jugular access site and blunt dissection was performed of the subcutaneous tissues. Under ultrasound guidance, the jugular vein was accessed with a 21 ga micropuncture needle and an 0.018" wire was inserted to the superior vena cava. Real-time ultrasound guidance was utilized for vascular access including the acquisition of a permanent ultrasound image documenting patency of the accessed vessel. A 5 Fr micopuncture set was then used, through which a 0.035" Rosen wire was passed under fluoroscopic guidance into the inferior vena cava. An 8 Fr  dilator was then placed over the wire. A subcutaneous port pocket was then created along the upper chest wall utilizing a combination of sharp and blunt dissection. The pocket was irrigated with sterile  saline, packed with gauze, and observed for hemorrhage. A single lumen "ISP" sized power injectable port was chosen for placement. The 8 Fr catheter was tunneled from the port pocket site to the venotomy incision. The port was placed in the pocket. The external catheter was trimmed to appropriate length. The dilator was exchanged for an 8 Fr peel-away sheath under fluoroscopic guidance. The catheter was then placed through the sheath and the sheath was removed. Final catheter positioning was confirmed and documented with a fluoroscopic spot radiograph. The port was accessed with a Huber needle, aspirated, and flushed with heparinized saline. The deep dermal layer of the port pocket incision was closed with interrupted 3-0 Vicryl suture. Dermabond was then placed over the port pocket and neck incisions. The patient tolerated the procedure well without immediate post procedural complication. FINDINGS: After catheter placement, the tip lies within the superior cavoatrial junction. The catheter aspirates and flushes normally and is ready for immediate use. IMPRESSION: Successful placement of a power injectable Port-A-Cath via the right internal jugular vein. The catheter is ready for immediate use. Ruthann Cancer, MD [facility] Electronically Signed   By: Ruthann Cancer M.D.   On: 06/09/2022 14:57   [place] ASCITES (ABDOMEN LIMITED)  Result Date: 06/03/2022 CLINICAL DATA:  Abdominal distention. He feels that he may have ascites, but we are unclear. Please do ultrasound, and if no significant fluid, then don't proceed EXAM: LIMITED ABDOMEN ULTRASOUND FOR ASCITES TECHNIQUE: Limited ultrasound survey for ascites was performed in all four abdominal quadrants. COMPARISON:   CT AP, 05/16/2022. FINDINGS: Focused ultrasound along the abdominal quadrants. Trace peritoneal fluid at the RIGHT lower quadrant. No significant volume for safe paracentesis. Procedure was not performed. Performed by Rushie Nyhan, IR NP IMPRESSION: Trace peritoneal fluid of the RIGHT lower quadrant. No significant volume for paracentesis. Procedure was not performed. Michaelle Birks, MD [facility] Electronically Signed   By: Michaelle Birks M.D.   On: 06/03/2022 18:48   NM PET Image Initial (PI) Skull Base To Thigh (F-18 FDG)  Result Date: 06/02/2022 CLINICAL DATA:  Initial treatment strategy for non-small cell lung cancer. Widespread metastatic disease on imaging. EXAM: NUCLEAR MEDICINE PET SKULL BASE TO THIGH TECHNIQUE: 11.1 mCi F-18 FDG was injected intravenously. Full-ring PET imaging was performed from the skull base to thigh after the radiotracer. CT data was obtained and used for attenuation correction and anatomic localization. Fasting blood glucose: 146 mg/dl COMPARISON:  Chest CTA and abdominopelvic CT 05/16/2022. Abdominal MRI 05/18/2022. FINDINGS: Mediastinal blood pool activity: SUV max 1.8 NECK: No hypermetabolic cervical lymph nodes are identified.Fairly symmetric activity within the lymphoid tissue of Waldeyer's ring is within physiologic limits.No suspicious activity identified within the pharyngeal mucosal space. Incidental CT findings: Bilateral carotid atherosclerosis. CHEST: There are hypermetabolic and enlarged right paratracheal, subcarinal and right hilar lymph nodes. Subcarinal node has a short axis dimension of 2.0 cm and SUV max of 5.8. Irregularly lobulated right lower lobe mass measuring 3.7 x 3.0 cm on image [id number] is hypermetabolic with an SUV max of 4.7. No other hypermetabolic pulmonary activity or suspicious nodularity. Incidental CT findings: Mild linear atelectasis or scarring at the left lung base. Mild atherosclerosis  of the aorta, great vessels and coronary arteries. ABDOMEN/PELVIS: The liver is enlarged, measuring 30.3 cm transverse. There are underlying cirrhotic changes with innumerable hypermetabolic lesions consistent with widespread metastatic disease. Confluent lesions in the left lobe evidence UV max of 7.1. The medial splenic lesion seen  on MRI is hypermetabolic (SUV max 5.3). No evidence of metastatic disease within the adrenal glands or pancreas. There is no hypermetabolic nodal activity in the abdomen or pelvis. Bowel activity within physiologic limits. Small amount of ascites without associated hypermetabolic activity to suggest carcinomatosis. Incidental CT findings: Contracted gallbladder with small gallstones. Diffuse aortic and branch vessel atherosclerosis. As above, ascites without definite evidence of carcinomatosis. There are diverticular changes in the distal colon. SKELETON: Widespread heterogeneous metabolic activity throughout the bones consistent with metastatic disease. There are multiple lesions throughout the spine, bony pelvis, sternum, ribs and both proximal humeri. No evidence of pathologic fracture. Incidental CT findings: none IMPRESSION: 1. Hypermetabolic right lower lobe pulmonary mass consistent with primary bronchogenic carcinoma. 2. Hypermetabolic nodal metastases in the right hilum and mediastinum. 3. Widespread hypermetabolic hepatic metastatic disease with underlying cirrhosis. 4. Hypermetabolic splenic lesion consistent with a metastasis. 5. Widespread hypermetabolic osseous metastatic disease. 6. Ascites without definite signs of peritoneal carcinomatosis. 7.  Aortic Atherosclerosis (ICD10-I70.0). Electronically Signed   By: Richardean Sale M.D.   On: 06/02/2022 15:18    Microbiology: No results found for this or any previous visit (from the past 240 hour(s)).   Labs: Basic Metabolic Panel: No results for input(s): "NA", "K", "CL", "CO2", "GLUCOSE", "BUN", "CREATININE",  "CALCIUM", "MG", "PHOS" in the last 168 hours. Liver Function Tests: No results for input(s): "AST", "ALT", "ALKPHOS", "BILITOT", "PROT", "ALBUMIN" in the last 168 hours. No results for input(s): "LIPASE", "AMYLASE" in the last 168 hours. No results for input(s): "AMMONIA" in the last 168 hours. CBC: No results for input(s): "WBC", "NEUTROABS", "HGB", "HCT", "MCV", "PLT" in the last 168 hours. Cardiac Enzymes: No results for input(s): "CKTOTAL", "CKMB", "CKMBINDEX", "TROPONINI" in the last 168 hours. D-Dimer No results for input(s): "DDIMER" in the last 72 hours. BNP: Invalid input(s): "POCBNP" CBG: No results for input(s): "GLUCAP" in the last 168 hours. Anemia work up No results for input(s): "VITAMINB12", "FOLATE", "FERRITIN", "TIBC", "IRON", "RETICCTPCT" in the last 72 hours. Urinalysis    Component Value Date/Time   COLORURINE AMBER (A) 06/01/2022 1542   APPEARANCEUR CLEAR 06/26/2022 1542   LABSPEC >1.046 (H) 06/24/2022 1542   PHURINE 6.0 05/27/2022 1542   GLUCOSEU NEGATIVE 05/27/2022 1542   HGBUR SMALL (A) 06/02/2022 1542   BILIRUBINUR MODERATE (A) 05/31/2022 1542   KETONESUR NEGATIVE 06/24/2022 1542   PROTEINUR 30 (A) 06/02/2022 1542   NITRITE NEGATIVE 06/01/2022 1542   LEUKOCYTESUR NEGATIVE 06/07/2022 1542   Sepsis Labs No results for input(s): "WBC" in the last 168 hours.  Invalid input(s): "PROCALCITONIN", "LACTICIDVEN"  SIGNED:  Georgette Shell, MD  [facility] 06/30/2022, 2:14 PM

## 2022-08-01 ENCOUNTER — Ambulatory Visit: Payer: Commercial Managed Care - HMO | Admitting: Physician Assistant

## 2023-01-03 IMAGING — CT CT HEART MORP W/ CTA COR W/ SCORE W/ CA W/CM &/OR W/O CM
4 of 7 series · 8 of 20 positions shown, 9 images · non-contrast
Comparison: None.
COMPARISON: None.

Addendum:
EXAM:
OVER-READ INTERPRETATION  CT CHEST

The following report is an over-read performed by radiologist Dr.
Philany Adolf [REDACTED] on 06/14/2021. This
over-read does not include interpretation of cardiac or coronary
anatomy or pathology. The coronary calcium score/coronary CTA
interpretation by the cardiologist is attached.
CLINICAL DATA: Chest pain
Cardiac/Coronary CTA
TECHNIQUE: A non-contrast, gated CT scan was obtained with axial slices of 3 mm
through the heart for calcium scoring. Calcium scoring was performed
using the Agatston method. A 120 kV prospective, gated, contrast
cardiac scan was obtained. Gantry rotation speed was 250 msecs and
collimation was 0.6 mm. Two sublingual nitroglycerin tablets (0.8
mg) were given. The 3D data set was reconstructed in 5% intervals of
the 35-75% of the R-R cycle. Diastolic phases were analyzed on a
dedicated workstation using MPR, MIP, and VRT modes. The patient
received 95 cc of contrast.

[Series 6: ts syst sharp · axial · 0.36mm/px · z∈[-201,-164]mm · 2 of 281 slices shown]
[im 94/281  lung]
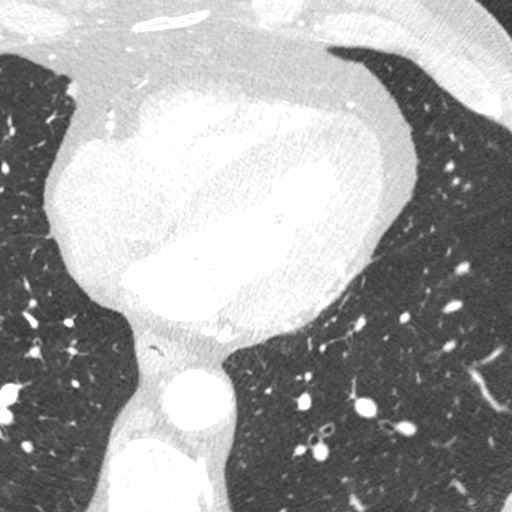
[im 187/281  lung]
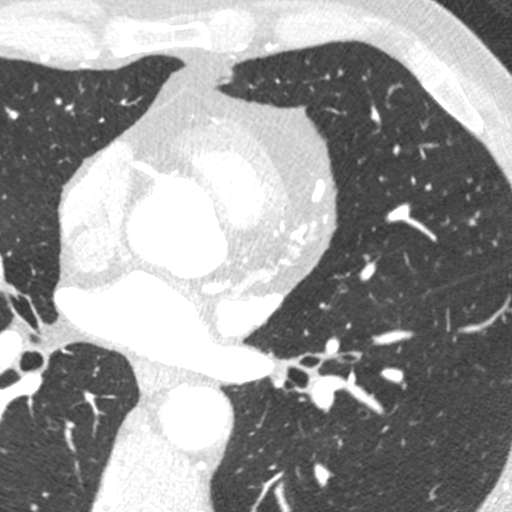

[Series 7: ts diast sharp · axial · 0.36mm/px · z∈[-201,-164]mm · 2 of 281 slices shown]
[im 94/281  lung]
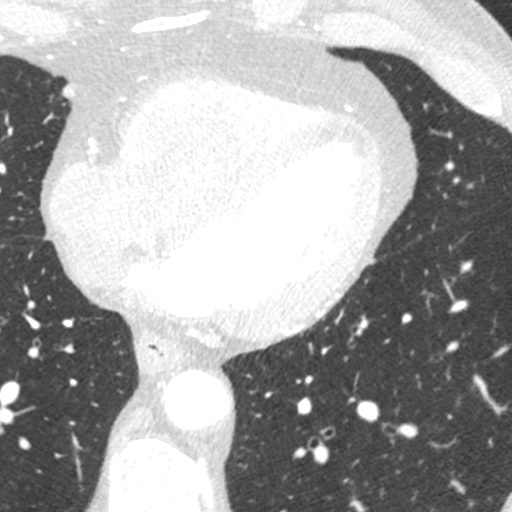
[im 187/281  lung]
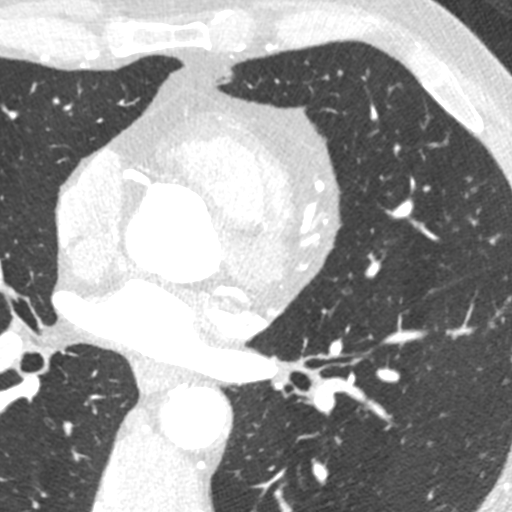

[Series 9: best diast · axial · 0.36mm/px · z∈[-201,-164]mm · 2 of 281 slices shown, 3 images]
[im 94/281  vessel]
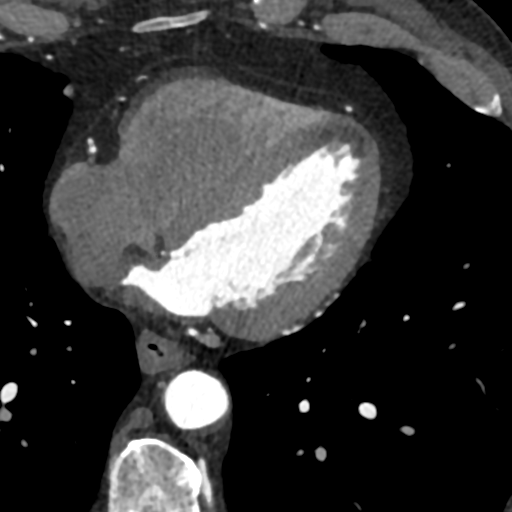
[im 94/281  lung]
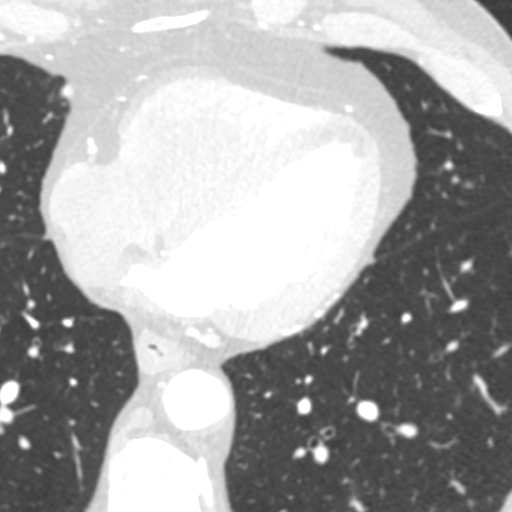
[im 187/281  vessel]
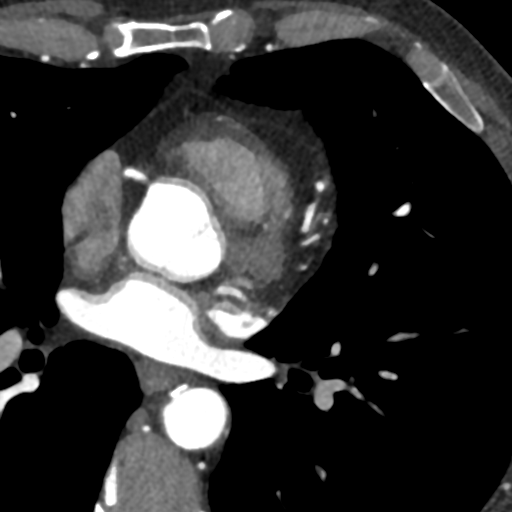

[Series 10: best syst · axial · 0.36mm/px · z∈[-201,-164]mm · 2 of 281 slices shown]
[im 94/281  vessel]
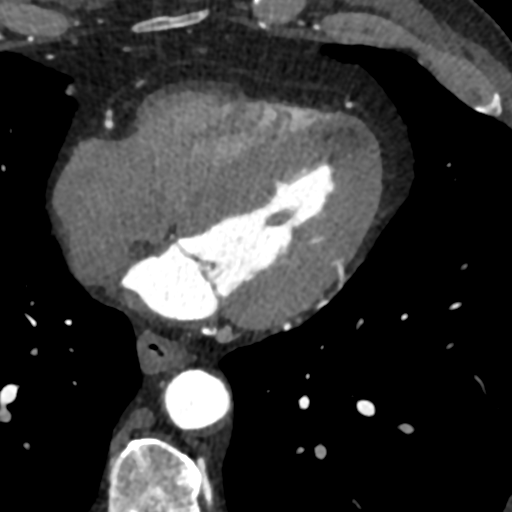
[im 187/281  vessel]
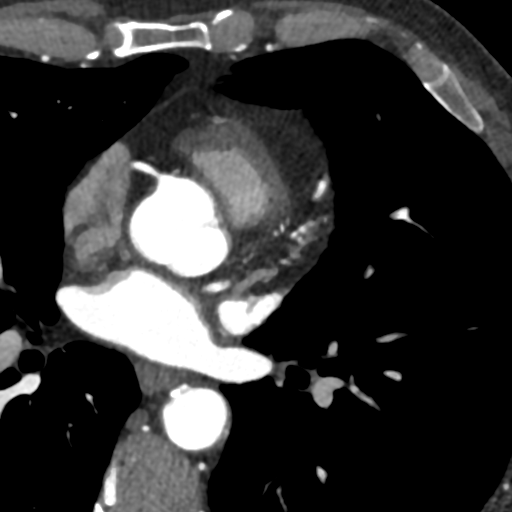

[8 of 20 positions shown; findings below may reference images not displayed]

FINDINGS: Atherosclerotic calcifications in the thoracic aorta. Within the
visualized portions of the thorax there are no suspicious appearing
pulmonary nodules or masses, there is no acute consolidative
airspace disease, no pleural effusions, no pneumothorax and no
lymphadenopathy. Visualized portions of the upper abdomen are
unremarkable. There are no aggressive appearing lytic or blastic
lesions noted in the visualized portions of the skeleton.
IMPRESSION: 1.  Aortic Atherosclerosis ([id number]-F3X.X).
FINDINGS: Image quality: Excellent.

Noise artifact is: Limited.

Slab artifact: Significant

Coronary Arteries: Anomalous coronary origin of the Lcx and LAD
which arise from separate ostia off the left coronary cusp.
Co-dominance.

Left main: The left main is absent and LAD and LCx arise from
separate ostia off the left coronary cusp.

Left anterior descending artery: The LAD is patent and gives off 2
patent diagonal branches. There is mild calcified plaque in the
proximal and mid LAD with associated stenosis of 25-49%.

Left circumflex artery: The LCX is co-dominant and gives off a small
OM1 and a large trifurcating OM2 and a LPLA. There is mild calcified
plaque in the proximal LCx with associated stenosis of 25-49% and
minimal calcified plaque in the proximal OM1 with associated
stenosis of < 25%.

Right coronary artery: The RCA is co-dominant with normal take off
from the right coronary cusp. The RCA terminates as a PDA without
evidence of plaque or stenosis. There is mild calcified plaque in
the proximal RCA with associated stenosis of 25-49%.

Right Atrium: Right atrial size is within normal limits.

Right Ventricle: The right ventricular cavity is within normal
limits.

Left Atrium: Left atrial size is normal in size with no left atrial
appendage filling defect.

Left Ventricle: The ventricular cavity size is within normal limits.
There is mild LVH. There are no stigmata of prior infarction. There
is no abnormal filling defect.

Pulmonary arteries: Normal in size without proximal filling defect.

Pulmonary veins: Normal pulmonary venous drainage.

Pericardium: Normal thickness with no significant effusion or
calcium present.

Cardiac valves: The aortic valve is trileaflet without significant
calcification. The mitral valve is normal structure without
significant calcification.

Aorta: Normal caliber with aortic atherosclerosis.

Extra-cardiac findings: See attached radiology report for
non-cardiac structures.
IMPRESSION: 1. Coronary calcium score of 510. This was 91st percentile for age-,
sex, and race-matched controls.

2. Anomalous coronary origins of the LAD and LCx which arise off
separate ostia from the left coronary cusp with absent LM coronary
artery. The right and left are co dominant.

3.  Mild atherosclerosis.  CAD RADS 2.

4.  Recommend preventive therapy and risk factor modification.

5.  Consider non atherosclerotic causes of chest pain.

RECOMMENDATIONS:
1. CAD-RADS 0: No evidence of CAD (0%). Consider non-atherosclerotic
causes of chest pain.

2. CAD-RADS 1: Minimal non-obstructive CAD (0-24%). Consider
non-atherosclerotic causes of chest pain. Consider preventive
therapy and risk factor modification.

3. CAD-RADS 2: Mild non-obstructive CAD (25-49%). Consider
non-atherosclerotic causes of chest pain. Consider preventive
therapy and risk factor modification.

4. CAD-RADS 3: Moderate stenosis. Consider symptom-guided
anti-ischemic pharmacotherapy as well as risk factor modification
per guideline directed care. Additional analysis with CT FFR will be
submitted.

5. CAD-RADS 4: Severe stenosis. (70-99% or > 50% left main). Cardiac
catheterization or CT FFR is recommended. Consider symptom-guided
anti-ischemic pharmacotherapy as well as risk factor modification
per guideline directed care. Invasive coronary angiography
recommended with revascularization per published guideline
statements.

6. CAD-RADS 5: Total coronary occlusion (100%). Consider cardiac
catheterization or viability assessment. Consider symptom-guided
anti-ischemic pharmacotherapy as well as risk factor modification
per guideline directed care.

7. CAD-RADS N: Non-diagnostic study. Obstructive CAD can't be
excluded. Alternative evaluation is recommended.

*** End of Addendum ***
EXAM:
OVER-READ INTERPRETATION  CT CHEST

The following report is an over-read performed by radiologist Dr.
Philany Adolf [REDACTED] on 06/14/2021. This
over-read does not include interpretation of cardiac or coronary
anatomy or pathology. The coronary calcium score/coronary CTA
interpretation by the cardiologist is attached.
FINDINGS: Atherosclerotic calcifications in the thoracic aorta. Within the
visualized portions of the thorax there are no suspicious appearing
pulmonary nodules or masses, there is no acute consolidative
airspace disease, no pleural effusions, no pneumothorax and no
lymphadenopathy. Visualized portions of the upper abdomen are
unremarkable. There are no aggressive appearing lytic or blastic
lesions noted in the visualized portions of the skeleton.
IMPRESSION: 1.  Aortic Atherosclerosis ([id number]-F3X.X).
